# Patient Record
Sex: Male | Born: 1945
Health system: Southern US, Community
[De-identification: ages and names within clinical notes are randomized; demographics above are authoritative.]

## PROBLEM LIST (undated history)

## (undated) DIAGNOSIS — T50905A Adverse effect of unspecified drugs, medicaments and biological substances, initial encounter: Secondary | ICD-10-CM

## (undated) DIAGNOSIS — E785 Hyperlipidemia, unspecified: Secondary | ICD-10-CM

## (undated) DIAGNOSIS — L97509 Non-pressure chronic ulcer of other part of unspecified foot with unspecified severity: Secondary | ICD-10-CM

## (undated) DIAGNOSIS — B351 Tinea unguium: Secondary | ICD-10-CM

## (undated) DIAGNOSIS — N529 Male erectile dysfunction, unspecified: Secondary | ICD-10-CM

## (undated) DIAGNOSIS — C9000 Multiple myeloma not having achieved remission: Secondary | ICD-10-CM

## (undated) DIAGNOSIS — I4891 Unspecified atrial fibrillation: Secondary | ICD-10-CM

## (undated) DIAGNOSIS — I10 Essential (primary) hypertension: Secondary | ICD-10-CM

## (undated) DIAGNOSIS — G2581 Restless legs syndrome: Secondary | ICD-10-CM

## (undated) DIAGNOSIS — C61 Malignant neoplasm of prostate: Secondary | ICD-10-CM

## (undated) DIAGNOSIS — E1140 Type 2 diabetes mellitus with diabetic neuropathy, unspecified: Secondary | ICD-10-CM

## (undated) DIAGNOSIS — G629 Polyneuropathy, unspecified: Secondary | ICD-10-CM

## (undated) DIAGNOSIS — C439 Malignant melanoma of skin, unspecified: Secondary | ICD-10-CM

## (undated) DIAGNOSIS — E1169 Type 2 diabetes mellitus with other specified complication: Secondary | ICD-10-CM

## (undated) DIAGNOSIS — K2100 Gastro-esophageal reflux disease with esophagitis, without bleeding: Secondary | ICD-10-CM

## (undated) HISTORY — DX: Non-pressure chronic ulcer of other part of unspecified foot with unspecified severity: L97.509

## (undated) HISTORY — DX: Polyneuropathy, unspecified: G62.9

## (undated) HISTORY — DX: Gastro-esophageal reflux disease with esophagitis, without bleeding: K21.00

## (undated) HISTORY — DX: Tinea unguium: B35.1

## (undated) HISTORY — DX: Malignant melanoma of skin, unspecified: C43.9

## (undated) HISTORY — PX: OTHER SURGICAL HISTORY: SHX169

## (undated) HISTORY — DX: Adverse effect of unspecified drugs, medicaments and biological substances, initial encounter: T50.905A

## (undated) HISTORY — PX: CATARACT EXTRACTION: SUR2

## (undated) HISTORY — DX: Type 2 diabetes mellitus with other specified complication: E11.69

## (undated) HISTORY — DX: Hyperlipidemia, unspecified: E78.5

## (undated) HISTORY — DX: Restless legs syndrome: G25.81

## (undated) HISTORY — DX: Type 2 diabetes mellitus with diabetic neuropathy, unspecified: E11.40

## (undated) HISTORY — PX: PROSTATE SURGERY: SHX751

## (undated) HISTORY — DX: Male erectile dysfunction, unspecified: N52.9

## (undated) HISTORY — DX: Malignant neoplasm of prostate: C61

## (undated) HISTORY — DX: Unspecified atrial fibrillation: I48.91

## (undated) HISTORY — PX: HERNIA REPAIR: SHX51

## (undated) HISTORY — DX: Multiple myeloma not having achieved remission: C90.00

## (undated) HISTORY — DX: Essential (primary) hypertension: I10

---

## 2003-03-04 ENCOUNTER — Ambulatory Visit (HOSPITAL_COMMUNITY): Admission: RE | Admit: 2003-03-04 | Discharge: 2003-03-04 | Payer: Self-pay | Admitting: Pulmonary Disease

## 2004-06-28 ENCOUNTER — Inpatient Hospital Stay (HOSPITAL_COMMUNITY): Admission: RE | Admit: 2004-06-28 | Discharge: 2004-07-02 | Payer: Self-pay | Admitting: Urology

## 2007-04-11 ENCOUNTER — Ambulatory Visit (HOSPITAL_COMMUNITY): Admission: RE | Admit: 2007-04-11 | Discharge: 2007-04-11 | Payer: Self-pay | Admitting: General Surgery

## 2007-06-18 ENCOUNTER — Ambulatory Visit (HOSPITAL_COMMUNITY): Admission: RE | Admit: 2007-06-18 | Discharge: 2007-06-18 | Payer: Self-pay | Admitting: General Surgery

## 2007-06-18 ENCOUNTER — Encounter (INDEPENDENT_AMBULATORY_CARE_PROVIDER_SITE_OTHER): Payer: Self-pay | Admitting: General Surgery

## 2010-08-15 NOTE — Op Note (Signed)
NAME:  HUDSEN, FEI NO.:  [id number]   MEDICAL RECORD NO.:  [id number]          PATIENT TYPE:  AMB   LOCATION:  DAY                           FACILITY:  [facility]   PHYSICIAN:  Dalia Heading, M.D.  DATE OF BIRTH:  1945/08/07   DATE OF PROCEDURE:  06/18/2007  DATE OF DISCHARGE:                               OPERATIVE REPORT   PREOPERATIVE DIAGNOSIS:  Sebaceous cyst, scalp and chest wall and the  skin tag, left canthus of eye.   POSTOPERATIVE DIAGNOSIS:  Sebaceous cyst, scalp and chest wall and the  skin tag, left canthus of eye.   PROCEDURE:  Excision of sebaceous cysts, scalp and chest wall and shave  removal of skin tag, left lateral canthus of eye.   SURGEON:  Dr. Franky Macho.   ANESTHESIA:  General endotracheal.   INDICATIONS:  The patient is a 65 year old white male who is referred  for evaluation and treatment of sebaceous cyst on the scalp and chest  wall as well as a skin tag along the left lateral aspect of the eye.  The risks and benefits of the procedures including bleeding, infection,  recurrence of the sebaceous cysts were fully explained to the patient,  who gave informed consent.   PROCEDURE NOTE:  The patient was placed in supine position.  After  induction of general endotracheal anesthesia, the left posterior  auricular scalp, lateral left eye, and chest wall were all prepped and  draped in the usual sterile technique with Betadine.  Surgical site  confirmation was performed.   Shave removal of the skin tag on the left eye canthus was performed  using Bovie electrocautery without difficulty.   Next, a 1.5 cm sebaceous cyst was excised from the left posterior  auricular region of the scalp without difficulty.  Any bleeding was  controlled using Bovie electrocautery.  Specimen sent to pathology for  further examination.  The 0.5% Sensorcaine was instilled into the  surrounding wound.  The skin was closed using 4-0 nylon interrupted  sutures.  Betadine ointment and dry sterile dressing were applied.   Next, a 3-cm sebaceous cyst along the left lower midline of the sternum  was excised.  The specimen sent to pathology for further examination.  Any bleeding was controlled using Bovie electrocautery.  0.5%  Sensorcaine was instilled into the surrounding wound.  The skin was  closed using 3-0 nylon interrupted sutures.  Betadine ointment and dry  sterile dressings were applied.   All tape and needle counts were correct at the end of all procedures.  The patient was extubated in the operating room and went back to  recovery room awake and in stable condition.   COMPLICATIONS:  None.   SPECIMEN:  1. Sebaceous cyst, scalp.  2. Sebaceous cyst, chest wall.   BLOOD LOSS:  Minimal.      Dalia Heading, M.D.  Electronically Signed     MAJ/MEDQ  D:  06/18/2007  T:  06/18/2007  Job:  [id number]   cc:   Ramon Dredge L. Juanetta Gosling, M.D.  Fax: [phone]

## 2010-08-15 NOTE — H&P (Signed)
NAME:  [name] [name], [name] [name] NO.:  [id number]   MEDICAL RECORD NO.:  [id number]          PATIENT TYPE:  AMB   LOCATION:  DAY                           FACILITY:  [facility]   PHYSICIAN:  Dalia Heading, M.D.  DATE OF BIRTH:  04/02/1946   DATE OF ADMISSION:  DATE OF DISCHARGE:  LH                              HISTORY & PHYSICAL   CHIEF COMPLAINT:  Sebaceous cyst, scalp and chest wall, and skin tag,  right lateral canthus of eye.   HISTORY OF PRESENT ILLNESS:  Patient is a 65 year old white male who was  referred for evaluation and treatment of two nodules.  One is on his  scalp, and the other is on the chest wall.  Both have been present for  some time but are recently increasing in size and causing him  discomfort.  He also has a skin tag lateral to the right eye.   PAST MEDICAL HISTORY:  Hypertension.   PAST SURGICAL HISTORY:  Prostate surgery.   CURRENT MEDICATIONS:  Atenolol/hydrochlorothiazide and Neurontin.   ALLERGIES:  PENICILLIN.   REVIEW OF SYSTEMS:  Patient denies drinking or smoking.  He denies any  recent chest pain, MI, CVA, or diabetes mellitus.   PHYSICAL EXAMINATION:  Patient is a well-developed and well-nourished  white male in no acute distress.  HEENT:  A 1.5 cm hard nodule behind the left ear on the scalp.  There is  also a small skin tag lateral to the right eye.  LUNGS:  Clear to auscultation with equal breath sounds bilaterally.  HEART:  Regular rate and rhythm without S3, S4, or murmurs.  CHEST:  A lower midline area over the sternum with a 3 cm ovoid, cystic  lesion present.  It is subcutaneous in nature.   IMPRESSION:  1. Sebaceous cyst, scalp and chest wall.  2. Skin tag, right lateral canthus of eye.   PLAN:  Patient is scheduled for excision of the cyst, scalp, and chest  wall as well as the removal of the skin tag, right lateral canthus on  April 11, 2007.  The risks and benefits of the procedure, including  bleeding, infection,  and recurrence, were fully explained to the  patient, who gave informed consent.      Dalia Heading, M.D.     MAJ/MEDQ  D:  04/08/2007  T:  04/08/2007  Job:  [id number]   cc:   [facility]  Fax: [phone]   Oneal Deputy. Juanetta Gosling, M.D.  Fax: [phone]

## 2010-08-15 NOTE — H&P (Signed)
NAME:  [name] [name], DYKE NO.:  [id number]   MEDICAL RECORD NO.:  [id number]          PATIENT TYPE:  AMB   LOCATION:  DAY                           FACILITY:  [facility]   PHYSICIAN:  Dalia Heading, M.D.  DATE OF BIRTH:  1945/12/18   DATE OF ADMISSION:  DATE OF DISCHARGE:  LH                              HISTORY & PHYSICAL   CHIEF COMPLAINT:  Sebaceous cyst, scalp and chest wall, and skin tag,  right lateral canthus of eye.   HISTORY OF PRESENT ILLNESS:  Patient is a 65 year old white male, who  was referred for evaluation and treatment of multiple sebaceous cysts.  He has one on his scalp and the other one on the chest wall.  Both have  been present for some time, but recently are increasing in size and  causing him discomfort.  He is also having a skin tag removed, lateral  to his right eye.   PAST MEDICAL HISTORY:  Includes hypertension and hyperglycemia.   PAST SURGICAL HISTORY:  Prostate surgery.   CURRENT MEDICATIONS:  Atenolol/hydrochlorothiazide, a sugar pill,  Neurontin.   ALLERGIES:  PENICILLIN.   REVIEW OF SYSTEMS:  Patient denies drinking or smoking.  Denies any  recent chest pain, MI, CVA or bleeding disorders.   PHYSICAL EXAMINATION:  Patient is a well-developed, well-nourished white  male, in no acute distress.  HEENT EXAMINATION:  Reveals a 1.5 cm hard nodule behind the left ear on  the scalp.  There is also a small skin tag, lateral to the right eye.  LUNGS:  Clear to auscultation with equal breath sounds bilaterally.  HEART EXAMINATION:  Reveals a regular rate and rhythm without S3, S4, or  murmurs.  CHEST EXAMINATION:  Reveals a 3 cm ovoid cystic lesion present along the  lower midline area of the sternum.  It is subcutaneous in nature.   IMPRESSION:  1. Sebaceous cysts, scalp and chest wall.  2. Skin tag, right lateral canthus of eye.   PLAN:  The patient is scheduled for excision of the cysts on the scalp  and chest wall, as well as  removal of the skin tag, right lateral  canthus, on June 18, 2007.  The risks and benefits of the procedures,  including bleeding, infection, recurrence, were fully explained to the  patient, who gave informed consent.      Dalia Heading, M.D.  Electronically Signed     MAJ/MEDQ  D:  06/13/2007  T:  06/13/2007  Job:  [id number]   cc:   Ramon Dredge L. Juanetta Gosling, M.D.  Fax: [phone]

## 2010-08-15 NOTE — H&P (Signed)
NAME:  [name] [name], [name] [name] NO.:  [id number]   MEDICAL RECORD NO.:  [id number]          PATIENT TYPE:  AMB   LOCATION:  DAY                           FACILITY:  [facility]   PHYSICIAN:  Dalia Heading, M.D.  DATE OF BIRTH:  12-Feb-1946   DATE OF ADMISSION:  DATE OF DISCHARGE:  LH                              HISTORY & PHYSICAL   CHIEF COMPLAINT:  Sebaceous cyst, scalp and chest wall, and skin tag,  right lateral canthus of eye.   HISTORY OF PRESENT ILLNESS:  Patient is a 65 year old white male who was  referred for evaluation and treatment of two nodules.  One is on his  scalp, and the other is on the chest wall.  Both have been present for  some time but are recently increasing in size and causing him  discomfort.  He also has a skin tag lateral to the right eye.   PAST MEDICAL HISTORY:  Hypertension.   PAST SURGICAL HISTORY:  Prostate surgery.   CURRENT MEDICATIONS:  Atenolol/hydrochlorothiazide and Neurontin.   ALLERGIES:  PENICILLIN.   REVIEW OF SYSTEMS:  Patient denies drinking or smoking.  He denies any  recent chest pain, MI, CVA, or diabetes mellitus.   PHYSICAL EXAMINATION:  Patient is a well-developed and well-nourished  white male in no acute distress.  HEENT:  A 1.5 cm hard nodule behind the left ear on the scalp.  There is  also a small skin tag lateral to the right eye.  LUNGS:  Clear to auscultation with equal breath sounds bilaterally.  HEART:  Regular rate and rhythm without S3, S4, or murmurs.  CHEST:  A lower midline area over the sternum with a 3 cm ovoid, cystic  lesion present.  It is subcutaneous in nature.   IMPRESSION:  1. Sebaceous cyst, scalp and chest wall.  2. Skin tag, right lateral canthus of eye.   PLAN:  Patient is scheduled for excision of the cyst, scalp, and chest  wall as well as the removal of the skin tag, right lateral canthus on  April 11, 2007.  The risks and benefits of the procedure, including  bleeding, infection,  and recurrence, were fully explained to the  patient, who gave informed consent.      Dalia Heading, M.D.  Electronically Signed     MAJ/MEDQ  D:  04/08/2007  T:  04/08/2007  Job:  [id number]   cc:   [facility]  Fax: [phone]   Oneal Deputy. Juanetta Gosling, M.D.  Fax: [phone]

## 2010-08-18 NOTE — Group Therapy Note (Signed)
NAME:  NABEEL, GLADSON NO.:  [id number]   MEDICAL RECORD NO.:  [id number]          PATIENT TYPE:  INP   LOCATION:  IC07                          FACILITY:  [facility]   PHYSICIAN:  Edward L. Juanetta Gosling, M.D.DATE OF BIRTH:  Aug 30, 1945   DATE OF PROCEDURE:  DATE OF DISCHARGE:                                   PROGRESS NOTE   PROBLEMS:  1.  Status post radical prostatectomy.  2.  Hypertension.   SUBJECTIVE:  Mr. Favor says he is feeling okay except he has some  problems with his eyes being dry and irritated.  He is otherwise doing well.   PHYSICAL EXAMINATION:  ABDOMEN:  Shows he has very scant sluggish bowel  sounds.  CHEST:  Very clear.  VITAL SIGNS:  Blood pressure 150/90, pulse is 80.   LABORATORY DATA:  Sodium 133, potassium 3.1, glucose 141.  A CBC shows a  white count of 12,600, hemoglobin is 12, platelets 231.   ASSESSMENT:  He seems to be doing fairly well.  I have ordered him some  Lacri-Lube eye drops.  I have ordered him some __________ and will plan to  follow from there.      ELH/MEDQ  D:  06/30/2004  T:  06/30/2004  Job:  [id number]

## 2010-08-18 NOTE — H&P (Signed)
NAME:  [name] [name], [name] [name] NO.:  [id number]   MEDICAL RECORD NO.:  [id number]          PATIENT TYPE:  AMB   LOCATION:  DAY                           FACILITY:  [facility]   PHYSICIAN:  Ky Barban, M.D.DATE OF BIRTH:  01-18-1946   DATE OF ADMISSION:  06/28/2004  DATE OF DISCHARGE:  LH                                HISTORY & PHYSICAL   CHIEF COMPLAINT:  Prostate cancer.   HISTORY:  A 65 year old gentleman referred to me by Dr. Juanetta Gosling on February  20 because his PSA was 4.78.  He has basically no other urological  complaint.  His age with symptoms score is 16.  I repeated his PSA.  In a  couple of weeks, it has gone up to 5.8, and his free PSA has dropped to 7.1.  I recommended that he undergo prostate biopsy which was done along with  __________ and multiple biopsies were done.  They came back positive.  Several biopsies were positive and shows Gleason grade of 4+4.  Because of  the age of 62, I recommended the following treatment options:  Radical  retropubic prostatectomy and radiotherapy.  I recommended that he undergo  radical retropubic prostatectomy, but there is a good chance the margins are  going to be positive because it is high-grade cancer, but we can give  radiation also or separately if need to be. or he can just go ahead and have  radiotherapy to start with.   I have a couple of meetings, long discussion with patient and his wife.  They came back and told me that he wanted to go ahead and have radical  retropubic prostatectomy.  I have discussed in detail its complications:  1)  Urinary incontinence which really goes away after a few months but  possibility that it becomes permanent is there, although small.  2) Erectile  dysfunction.  He will develop that is a very good chance.  3) Blood  transfusion.  These are usual complications with this operation, but other  possible complications such as pulmonary embolism, etc., are also possible   PAST  MEDICAL HISTORY:  He does have hypertension, taking medications.  No  history of diabetes.   MEDICATIONS:  He is taking Atenolol, Flomax, Prilosec.   FAMILY HISTORY:  No history of prostate cancer.   PERSONAL HISTORY:  Does not smoke or drink.   REVIEW OF SYSTEMS:  Denies any chest pain, orthopnea, PND, nausea, vomiting.   PHYSICAL EXAMINATION:  VITAL SIGNS:  Blood pressure 148/72, temperature  normal.  CENTRAL NERVOUS SYSTEM:  No gross neurological deficit.  HEAD/NECK/ENT:  Negative.  No thyromegaly or lymphadenopathy.  No carotid  bruits.  CHEST:  Clear.  HEART:  Regular sinus rhythm.  ABDOMEN:  Soft and flat.  Liver, spleen, kidneys not palpable.  EXTERNAL GENITALIA:  He is uncircumcised.  Meatus is adequate.  Testicles  are normal.  RECTAL:  Prostate is 1.5+, smooth, and firm.   IMPRESSION:  1.  Prostate cancer.  2.  Hypertension.   PLAN:  Radical retropubic prostatectomy, bilateral pelvic node dissection.  The patient is coming as outpatient  tomorrow and will be admitted after the  operation.      MIJ/MEDQ  D:  06/27/2004  T:  06/27/2004  Job:  [id number]   cc:   Ramon Dredge L. Juanetta Gosling, M.D.  [address]  [place]  [place] [address]  Fax: [phone]

## 2010-08-18 NOTE — Op Note (Signed)
NAME:  PJ, ZEHNER NO.:  [id number]   MEDICAL RECORD NO.:  [id number]          PATIENT TYPE:  AMB   LOCATION:  DAY                           FACILITY:  [facility]   PHYSICIAN:  Ky Barban, M.D.DATE OF BIRTH:  03/09/1946   DATE OF PROCEDURE:  06/28/2004  DATE OF DISCHARGE:                                 OPERATIVE REPORT   PREOPERATIVE DIAGNOSIS:  Carcinoma of prostate.   POSTOPERATIVE DIAGNOSIS:  Carcinoma of prostate.   PROCEDURES:  1.  Bilateral pelvic node dissections.  2.  Frozen section.  3.  Radical retropubic prostatectomy.   ANESTHESIA:  General endotracheal.   SURGEON:  Ky Barban, M.D.   ASSISTANT:  ___________, R.N.   ESTIMATED BLOOD LOSS:  1300 mL.  No replacement.   Instrument, needle and sponge counts correct.   COMPLICATIONS:  None.   DESCRIPTION OF PROCEDURE:  The patient was given general endotracheal  anesthesia.  In low lithotomy position after usual prep and drape, a midline  suprapubic incision was made.  Rectus sheath was incised.  Recti separated  in the midline.  The retropubic space was entered.  The bladder was  separated from the sidewalls.  The right and left external iliac veins and  obturator nerve and vessels were exposed.  A self-retaining Bookwalter  retractor was applied.  Proceeded to do node resection on the right side  triangle between the obturator nerve and vessels.  The external iliac vein  contains a lot of fatty tissue covering the psoas fascia.  Started from the  lateral margin.  Lymphatics were clipped and divided.  Specimens were lifted  off of the psoas fascia and clipping and dividing the lymphatics this  specimen was removed completely and sent for frozen section.  The same  specimen from the left side was removed.  Frozen section reports from both  side came back negative for any metastatic disease.  I proceeded to do the  radical prostatectomy.  The area of the lymph nodes was left  packed.  The  rectus pubic space was inspected.  The anterior surface of the prostate  capsule was cleaned of the fatty tissue.  The endopelvic fascia was divided.  A small incision was made near the apex of the prostate on both sides.  This  incision was carried superiorly along the lateral side of the prostate,  dividing the fascia and pushing the neurovascular bundles away from the  prostate.  Using 0 Vicryl stitch, it was placed in the dorsal vein complex  distal to the apex of the prostate.  A couple of stitches were placed and  one stitch was placed at the apex of the prostate in the prostate.  In  between these stitches, the dorsal vein complex was divided.  I also passed  a McDougal clamp through the endopelvic fascia opening near the apex of the  prostate and this is where I ligated the dorsal vein complex with 0 Vicryl  tie in addition to the blind stitches.  After dividing the dorsal vein  complex, the prostate moved superiorly.  The puboprostatic ligaments were  also  divided.  The apex of the prostate and the urethra were exposed.  The  patient has a Foley catheter.  I passed the long tip right angle behind the  urethra and lifted it up and divided the anterior wall of the urethra.  A  running stitch through the anterior wall of the urethra and the dorsal vein  complex using 3-0 Vicryl was placed.  The Foley catheter was grabbed with a  yellow ___________ clamp and pulled out.  The distal end was divided.  Then  the posterior wall of the urethra was divided.  With slight traction on the  apex of the prostate on the Foley catheter, the urethral rectalis muscle was  divided.  That freed the prostate and slight traction on the prostate lifted  it up.  Inferior pedicles of the prostate were clipped and divided.  The  rectum was pushed away from the prostate.  Denonvilliers fascia covering the  seminal vesicle was opened up.  Both seminal vesicles were dissected.  I was  able to get  the left one out, clipping the seminal vesicle artery.  ____________ were identified, clipped and divided.  The right seminal  vesicle was partially dissected out from this side.  Some part of the sheath  covering the seminal vesicle near the prostate was divided on each side.  Now I am ready to open the bladder neck.  Anteriorly it was opened up.  Foley catheter was removed and a #14 red rubber catheter was inserted.  The  ureteral orifice was identified.  A #5 whistle-tip catheter was passed up  into the ureters and stabilized to the trigone with 4-0 chromic stitch.  The  remainder part of the bladder neck under vision was divided with the help of  the cautery.  Plane was developed between the bladder and the prostate.  The  bladder neck was completely divided.  The superior pedicles of the prostate  were ligated and divided.  The remaining part of the seminal vesicle sheath  was divided from this side.  With blunt and sharp dissection, the left  seminal vesicle came out after clipping the seminal vesicle artery.  The  specimen was removed.  The obturator site was thoroughly irrigated and left  packed.  Bladder neck closed from each side with 0 Vicryl stitch, leaving  the opening the size of my index finger.  Now a running stitch on the  bladder neck everting the bladder mucosa covering the bladder neck using 4-0  chromic was applied.  Now I am ready to do the anastomosis.  A #30 Foley  catheter with 30 mL balloon was inserted through the urethra and the pelvic  end of the Foley catheter passed free tie through the Foley catheter.  One  assistant was pushing the perineum so that the urethral stump was pushed  superiorly.  Six stitches of 3-0 Vicryl were placed between the urethral  stump and the bladder neck.  After this the Foley balloon was checked and  the Foley catheter placed in the bladder.  The balloon was inflated to 30 mL.  The superior blade of the retractor was removed.  The  bladder was  pushed into the retropubic space so that urethral stump and the bladder neck  was approximated with traction on the Foley catheter.  All six stitches were  closed in the sequence they were placed.  The anterior stitches were tied to  the dorsal vein complex stitches along with the bladder neck stitch  on each  side.  The bladder was filled up.  No leak from the anastomosis.  The  ureteral catheter was already coming out through a separate stab wound in  the bladder.  All of the laps have been removed.  The sponge count and  instrument count are correct.  The needle count is correct.  So I proceeded  to close the incision.  I had placed a Shiley sump in the retropubic space  though a separate stab wound along with the ureteral catheter coming out  through the same opening.  They were stabilized to the skin with 0 silk  stitch along with the Shiley sump.  The rectus sheath was closed with a  continuous stitch of 0 Vicryl.  The  subcutaneous tissues were approximated with three to four stitches of 3-0  Vicryl stitch.  The skin was closed with clips.  The patient was stable.  He  lost about 1300 mL of blood.  No replacement.  Sterile gauze dressing  applied.  Left the operating room in satisfactory condition.      MIJ/MEDQ  D:  06/28/2004  T:  06/28/2004  Job:  [id number]   cc:   Ramon Dredge L. Juanetta Gosling, M.D.  [address]  [place]  [place] [address]  Fax: [phone]

## 2010-08-18 NOTE — Discharge Summary (Signed)
NAME:  [name] [name], [name] [name] NO.:  [id number]   MEDICAL RECORD NO.:  [id number]          PATIENT TYPE:  INP   LOCATION:  A214                          FACILITY:  [facility]   PHYSICIAN:  Ky Barban, M.D.DATE OF BIRTH:  1945/05/17   DATE OF ADMISSION:  06/28/2004  DATE OF DISCHARGE:  04/02/2006LH                                 DISCHARGE SUMMARY   A 65 year old had a prostate biopsy because his PSA was elevated.  His  several biopsies came back positive with Gleason grade of 4+4.  Because of  his age of 35, I recommended that he undergo radical retropubic  prostatectomy.  Other treatment options, including radiation, were  discussed.  The patient and his wife elected to undergo radical retropubic  prostatectomy, and he was briefed about the complications, especially  incontinence, erectile dysfunction and bleeding requiring blood transfusion.  He agreed to undergo radical prostatectomy.  His other problems include  hypertension, which is well under control.   HOSPITAL COURSE AND ROUTINE:  Admission workup is done.  CBC, urinalysis,  ASTRA-7 and EKG are all normal.  He was taken to the operating room on the  29th of March and underwent bilateral pelvic node dissection with radical  retropubic prostatectomy.  Post-op course is benign.  He was closely  followed by his family physician, Dr. Juanetta Gosling.  First post-op day, his blood  pressure 138/56, pulse 76 per minute, temperature 98.6, O2 saturation 95%.  Sodium 134, potassium 3, chloride 101, CO2 25, BUN 8, creatinine 1.1.  WBC  count 14.1, hematocrit 34.8.  I have added 20 mEq KCl __________ IV fluid.  Started him out of bed.  CBC and MET-7 in the morning.  The next day, his  potassium came up to 3.3.  Hematocrit 33.9.  He continued to do well.  On  March 31st, discontinued his sump and urethral catheter and started him on  clear liquids.  On April 1st, he was doing fine, up and about, and his wound  is healing up  primarily.  Had bowel movement.  His final pathology report is  back.  His TNM Code is pT2c/N0/Mx, and all the margins are negative.  I want  to mention that his Gleason score is much different than on the biopsy.  His  Gleason score is 2+1=3.  It involves both lobes.  As I mentioned, he is  being discharged with a Foley catheter.  I will see him back next week for  removal of the stitches.  For his discharge medications, he is given  Percocet 1 q.6-8h. p.r.n., #30.  I will see him back Thursday for removal of  the stitches.   FINAL DISCHARGE DIAGNOSES:  1.  Carcinoma, prostate.  2.  Hypertension.      MIJ/MEDQ  D:  07/25/2004  T:  07/25/2004  Job:  [id number]   cc:   Ramon Dredge L. Juanetta Gosling, M.D.  [address]  [place]  [place] [address]  Fax: [phone]

## 2010-08-18 NOTE — Group Therapy Note (Signed)
NAME:  [name] [name], [name] [name] NO.:  [id number]   MEDICAL RECORD NO.:  [id number]          PATIENT TYPE:  INP   LOCATION:  A214                          FACILITY:  [facility]   PHYSICIAN:  Edward L. Juanetta Gosling, M.D.DATE OF BIRTH:  [birth year]   DATE OF PROCEDURE:  07/01/2004  DATE OF DISCHARGE:                                   PROGRESS NOTE   PROBLEM:  Status post prostatectomy.   SUBJECTIVE:  [name] [name] says he is feeling pretty well.  He is having some  gas pains and wants to know if he can have a suppository.  I told him that  since his surgery was so close to his rectum that I would like to get an  opinion from Dr. Jerre Simon before I order anything.  Otherwise, he has been up  moving around which is excellent, and he has no other new complaints.  He  does have a history of hypertension.  His blood pressure is still pretty  well controlled, and I am not going to put him on anything until he is  taking stuff p.o. unless his pressure gets much higher.   PHYSICAL EXAMINATION:  GENERAL:  This morning, his examination shows that he  is awake and alert.  CHEST:  Very clear.  ABDOMEN:  He does have some bowel sounds.  VITAL SIGNS:  Temperature is 98.8, pulse 76, respirations 16, blood pressure  152/69, O2 saturation is 97% on 2 L.   ASSESSMENT:  He is better.   PLAN:  Continue with his treatments, continue with his medications, and  follow.      ELH/MEDQ  D:  07/01/2004  T:  07/01/2004  Job:  [id number]

## 2010-08-18 NOTE — Consult Note (Signed)
NAME:  [name], [name] NO.:  [id number]   MEDICAL RECORD NO.:  [id number]          PATIENT TYPE:  INP   LOCATION:  IC07                          FACILITY:  [facility]   PHYSICIAN:  Edward L. Juanetta Gosling, M.D.DATE OF BIRTH:  [birth year]   DATE OF CONSULTATION:  06/28/2004  DATE OF DISCHARGE:                                   CONSULTATION   REASON FOR CONSULTATION:  Medical management postoperatively.   HISTORY OF PRESENT ILLNESS:  [name] [name] is a patient who underwent a  surgical resection of the prostate today, having had prostate cancer.  I had  done a physical examination, check a PSA and it was 4.78.  It was repeated  then went up.  He had a biopsy done which was positive for cancer with a  Gleason 4+4.  He underwent prostatectomy today.   PAST MEDICAL HISTORY:  Hypertension.   MEDICATIONS:  1.  Atenolol 50 mg daily.  2.  Flomax 0.4 mg daily.  3.  Prilosec 20 mg daily.   FAMILY HISTORY:  Positive for breast cancer.  Both of his parents died of  cancer, and he had a son who died of cancer.   SOCIAL HISTORY:  He is a nonsmoker.  He does not drink any alcohol.   REVIEW OF SYSTEMS:  Except as mentioned is negative.   PHYSICAL EXAMINATION:  GENERAL:  He is postoperative.  Has catheter in  place.  VITAL SIGNS:  Pulse is 61, blood pressure 123/53, O2 saturation 92%,  respirations are about 10.  HEENT:  Mucous membranes are slightly dry.  CHEST:  Clear.  HEART:  Regular.  ABDOMEN:  Soft.  EXTREMITIES:  No edema.  NEUROLOGIC:  Grossly intact.   ASSESSMENT:  1.  He has hypertension.  2.  He has prostate cancer resected.  Postoperatively, his hemoglobin is 13,      hematocrit 36, potassium was 3.   PLAN:  Follow with you.  At this point, will not restart his blood pressure  medications.  Not start any anticoagulation, etc., as yet.  He is going to  have a Met-7 in the morning.  I will not replace his potassium until we see  what that shows.  Thanks for allowing  me to see him with you.      ELH/MEDQ  D:  06/28/2004  T:  06/28/2004  Job:  [id number]

## 2010-11-28 ENCOUNTER — Ambulatory Visit (HOSPITAL_COMMUNITY)
Admission: RE | Admit: 2010-11-28 | Discharge: 2010-11-28 | Disposition: A | Discharge status: Home / Self Care | Payer: Managed Care, Other (non HMO) | Source: Ambulatory Visit | Attending: Pulmonary Disease | Admitting: Pulmonary Disease

## 2010-11-28 ENCOUNTER — Other Ambulatory Visit (HOSPITAL_COMMUNITY): Payer: Self-pay | Admitting: Pulmonary Disease

## 2010-11-28 DIAGNOSIS — M79673 Pain in unspecified foot: Secondary | ICD-10-CM

## 2010-11-28 DIAGNOSIS — M2010 Hallux valgus (acquired), unspecified foot: Secondary | ICD-10-CM | POA: Insufficient documentation

## 2010-11-28 DIAGNOSIS — M949 Disorder of cartilage, unspecified: Secondary | ICD-10-CM | POA: Insufficient documentation

## 2010-11-28 DIAGNOSIS — M25579 Pain in unspecified ankle and joints of unspecified foot: Secondary | ICD-10-CM | POA: Insufficient documentation

## 2010-11-28 DIAGNOSIS — E119 Type 2 diabetes mellitus without complications: Secondary | ICD-10-CM | POA: Insufficient documentation

## 2010-11-28 DIAGNOSIS — M899 Disorder of bone, unspecified: Secondary | ICD-10-CM | POA: Insufficient documentation

## 2010-12-20 LAB — BASIC METABOLIC PANEL
BUN: 14
CO2: 27
Calcium: 9.2
Chloride: 91 — ABNORMAL LOW
Creatinine, Ser: 0.94
GFR calc Af Amer: >60
GFR calc non Af Amer: >60
Glucose, Bld: 469 — ABNORMAL HIGH
Potassium: 3.3 — ABNORMAL LOW
Sodium: 131 — ABNORMAL LOW

## 2010-12-20 LAB — POCT I-STAT 4, (NA,K, GLUC, HGB,HCT)
Glucose, Bld: 322 — ABNORMAL HIGH
HCT: 48
Hemoglobin: 16.3
Operator id: 282951
Potassium: 3.2 — ABNORMAL LOW
Sodium: 134 — ABNORMAL LOW

## 2010-12-20 LAB — CBC
HCT: 43.5
Hemoglobin: 15.2
MCHC: 35
MCV: 87.7
Platelets: 259
RBC: 4.96
RDW: 13.3
WBC: 9.9

## 2010-12-25 LAB — CBC
HCT: 44.3
Hemoglobin: 15.7
MCHC: 35.5
MCV: 88.4
Platelets: 305
RBC: 5.01
RDW: 13.1
WBC: 10

## 2010-12-25 LAB — BASIC METABOLIC PANEL
BUN: 13
CO2: 28
Calcium: 9.2
Chloride: 100
Creatinine, Ser: 0.91
GFR calc Af Amer: >60
GFR calc non Af Amer: >60
Glucose, Bld: 118 — ABNORMAL HIGH
Potassium: 3.6
Sodium: 135

## 2011-09-05 LAB — VITAMIN D 25 HYDROXY (VIT D DEFICIENCY, FRACTURES): Vit D, 25-Hydroxy: 40

## 2014-04-27 DIAGNOSIS — L97511 Non-pressure chronic ulcer of other part of right foot limited to breakdown of skin: Secondary | ICD-10-CM | POA: Diagnosis not present

## 2014-06-08 DIAGNOSIS — B351 Tinea unguium: Secondary | ICD-10-CM | POA: Diagnosis not present

## 2014-06-08 DIAGNOSIS — L84 Corns and callosities: Secondary | ICD-10-CM | POA: Diagnosis not present

## 2014-06-08 DIAGNOSIS — E1142 Type 2 diabetes mellitus with diabetic polyneuropathy: Secondary | ICD-10-CM | POA: Diagnosis not present

## 2014-06-15 DIAGNOSIS — I10 Essential (primary) hypertension: Secondary | ICD-10-CM | POA: Diagnosis not present

## 2014-06-15 DIAGNOSIS — I4891 Unspecified atrial fibrillation: Secondary | ICD-10-CM | POA: Diagnosis not present

## 2014-06-15 DIAGNOSIS — L97509 Non-pressure chronic ulcer of other part of unspecified foot with unspecified severity: Secondary | ICD-10-CM | POA: Diagnosis not present

## 2014-06-15 DIAGNOSIS — E1140 Type 2 diabetes mellitus with diabetic neuropathy, unspecified: Secondary | ICD-10-CM | POA: Diagnosis not present

## 2014-06-15 DIAGNOSIS — E1169 Type 2 diabetes mellitus with other specified complication: Secondary | ICD-10-CM | POA: Diagnosis not present

## 2014-06-16 ENCOUNTER — Ambulatory Visit (HOSPITAL_COMMUNITY)
Admission: RE | Admit: 2014-06-16 | Discharge: 2014-06-16 | Disposition: A | Discharge status: Home / Self Care | Payer: Medicare Other | Source: Ambulatory Visit | Attending: Pulmonary Disease | Admitting: Pulmonary Disease

## 2014-06-16 DIAGNOSIS — I4891 Unspecified atrial fibrillation: Secondary | ICD-10-CM | POA: Diagnosis not present

## 2014-06-16 NOTE — Progress Notes (Signed)
  Echocardiogram 2D Echocardiogram has been performed.  Samuel Germany 06/16/2014, 1:50 PM

## 2014-06-21 DIAGNOSIS — E1169 Type 2 diabetes mellitus with other specified complication: Secondary | ICD-10-CM | POA: Diagnosis not present

## 2014-06-21 DIAGNOSIS — L97509 Non-pressure chronic ulcer of other part of unspecified foot with unspecified severity: Secondary | ICD-10-CM | POA: Diagnosis not present

## 2014-06-21 DIAGNOSIS — E1140 Type 2 diabetes mellitus with diabetic neuropathy, unspecified: Secondary | ICD-10-CM | POA: Diagnosis not present

## 2014-06-21 DIAGNOSIS — E11319 Type 2 diabetes mellitus with unspecified diabetic retinopathy without macular edema: Secondary | ICD-10-CM | POA: Diagnosis not present

## 2014-06-21 DIAGNOSIS — I10 Essential (primary) hypertension: Secondary | ICD-10-CM | POA: Diagnosis not present

## 2014-06-23 ENCOUNTER — Encounter: Payer: Self-pay | Admitting: Cardiology

## 2014-06-23 ENCOUNTER — Ambulatory Visit (INDEPENDENT_AMBULATORY_CARE_PROVIDER_SITE_OTHER): Payer: Medicare Other | Admitting: Cardiology

## 2014-06-23 VITALS — BP 142/90 | HR 83 | Ht 68.0 in | Wt 201.0 lb

## 2014-06-23 DIAGNOSIS — I4891 Unspecified atrial fibrillation: Secondary | ICD-10-CM | POA: Diagnosis not present

## 2014-06-23 NOTE — Patient Instructions (Signed)

## 2014-06-23 NOTE — Progress Notes (Signed)
Clinical Summary Mr. Kaufhold is a 69 y.o.male seen today as a new patient for the following medical problems.  1. Afib - pcp records reviewed - new diagnosis made by pcp earlier this month - denies any palpitations, but has had some episodes of SOB.  - echo with normal LVEF, does have some left atrial enlargement. Labs are pending - on atenolol and eliquis, tolerating well.      No past medical history on file.   Allergies  Allergen Reactions  . Penicillins Rash    Also swelling reaction      Current Outpatient Prescriptions  Medication Sig Dispense Refill  . apixaban (ELIQUIS) 5 MG TABS tablet Take 5 mg by mouth 2 (two) times daily.    . diclofenac sodium (VOLTAREN) 1 % GEL Apply 2 g topically daily as needed.    Marland Kitchen ketoconazole (NIZORAL) 2 % cream Apply 1 application topically daily as needed for irritation.    . potassium chloride (KLOR-CON) 20 MEQ packet Take 20 mEq by mouth once.     No current facility-administered medications for this visit.     No past surgical history on file.   Allergies  Allergen Reactions  . Penicillins Rash    Also swelling reaction       No family history on file.   Social History Mr. Rubens reports that he has never smoked. He has never used smokeless tobacco. Mr. Kattner reports that he does not drink alcohol.   Review of Systems CONSTITUTIONAL: No weight loss, fever, chills, weakness or fatigue.  HEENT: Eyes: No visual loss, blurred vision, double vision or yellow sclerae.No hearing loss, sneezing, congestion, runny nose or sore throat.  SKIN: No rash or itching.  CARDIOVASCULAR: per HPI RESPIRATORY: No shortness of breath, cough or sputum.  GASTROINTESTINAL: No anorexia, nausea, vomiting or diarrhea. No abdominal pain or blood.  GENITOURINARY: No burning on urination, no polyuria NEUROLOGICAL: No headache, dizziness, syncope, paralysis, ataxia, numbness or tingling in the extremities. No change in bowel or  bladder control.  MUSCULOSKELETAL: No muscle, back pain, joint pain or stiffness.  LYMPHATICS: No enlarged nodes. No history of splenectomy.  PSYCHIATRIC: No history of depression or anxiety.  ENDOCRINOLOGIC: No reports of sweating, cold or heat intolerance. No polyuria or polydipsia.  Marland Kitchen   Physical Examination Filed Vitals:   06/23/14 0821  BP: 142/90  Pulse: 83   Filed Weights   06/23/14 0821  Weight: 201 lb (91.173 kg)    Gen: resting comfortably, no acute distress HEENT: no scleral icterus, pupils equal round and reactive, no palptable cervical adenopathy,  CV: irreg, no m/r/g, no JVD, no carotid bruits Resp: Clear to auscultation bilaterally GI: abdomen is soft, non-tender, non-distended, normal bowel sounds, no hepatosplenomegaly MSK: extremities are warm, no edema.  Skin: warm, no rash Neuro:  no focal deficits Psych: appropriate affect   Diagnostic Studies 06/2014 Study Conclusions  - Left ventricle: The cavity size was normal. There was mild concentric hypertrophy. Systolic function was normal. The estimated ejection fraction was in the range of 55% to 60%. Wall motion was normal; there were no regional wall motion abnormalities. The study was not technically sufficient to allow evaluation of LV diastolic dysfunction due to atrial fibrillation. - Aortic valve: Mildly calcified annulus. Mildly thickened leaflets. There was trivial regurgitation. - Mitral valve: Calcified annulus. Mildly thickened leaflets . There was mild regurgitation. - Left atrium: The atrium was severely dilated. Volume/bsa, ES, (1-plane Simpson&'s, A2C): 40 ml/m^2. - Right ventricle: Systolic  function was low normal. - Right atrium: The atrium was moderately dilated. - Tricuspid valve: There was mild-moderate regurgitation. - Pulmonary arteries: Pulmonary pressures may be underestimated given the degree of right atrial enlargement. There are likely mildly elevated  pulmonary pressures.   Assessment and Plan  1. Afib - new diagnosis. He denies any recurrent symptoms. Doing well on low dose atenolol, started on eliquis for stroke prevention due to CHADS2Vasc score of 3 (age, HTN,DM) - continue current meds, f/u labs      Arnoldo Lenis, M.D.

## 2014-07-27 DIAGNOSIS — I10 Essential (primary) hypertension: Secondary | ICD-10-CM | POA: Diagnosis not present

## 2014-07-27 DIAGNOSIS — I4891 Unspecified atrial fibrillation: Secondary | ICD-10-CM | POA: Diagnosis not present

## 2014-07-27 DIAGNOSIS — E1140 Type 2 diabetes mellitus with diabetic neuropathy, unspecified: Secondary | ICD-10-CM | POA: Diagnosis not present

## 2014-08-10 DIAGNOSIS — E1142 Type 2 diabetes mellitus with diabetic polyneuropathy: Secondary | ICD-10-CM | POA: Diagnosis not present

## 2014-08-10 DIAGNOSIS — L84 Corns and callosities: Secondary | ICD-10-CM | POA: Diagnosis not present

## 2014-08-10 DIAGNOSIS — B351 Tinea unguium: Secondary | ICD-10-CM | POA: Diagnosis not present

## 2014-10-19 DIAGNOSIS — L84 Corns and callosities: Secondary | ICD-10-CM | POA: Diagnosis not present

## 2014-10-19 DIAGNOSIS — E1142 Type 2 diabetes mellitus with diabetic polyneuropathy: Secondary | ICD-10-CM | POA: Diagnosis not present

## 2014-10-19 DIAGNOSIS — B351 Tinea unguium: Secondary | ICD-10-CM | POA: Diagnosis not present

## 2014-11-15 DIAGNOSIS — Z0000 Encounter for general adult medical examination without abnormal findings: Secondary | ICD-10-CM | POA: Diagnosis not present

## 2014-12-07 DIAGNOSIS — I10 Essential (primary) hypertension: Secondary | ICD-10-CM | POA: Diagnosis not present

## 2014-12-07 DIAGNOSIS — E1140 Type 2 diabetes mellitus with diabetic neuropathy, unspecified: Secondary | ICD-10-CM | POA: Diagnosis not present

## 2014-12-07 DIAGNOSIS — Z0000 Encounter for general adult medical examination without abnormal findings: Secondary | ICD-10-CM | POA: Diagnosis not present

## 2014-12-07 DIAGNOSIS — E785 Hyperlipidemia, unspecified: Secondary | ICD-10-CM | POA: Diagnosis not present

## 2014-12-07 DIAGNOSIS — B351 Tinea unguium: Secondary | ICD-10-CM | POA: Diagnosis not present

## 2014-12-07 DIAGNOSIS — G2581 Restless legs syndrome: Secondary | ICD-10-CM | POA: Diagnosis not present

## 2014-12-07 DIAGNOSIS — C61 Malignant neoplasm of prostate: Secondary | ICD-10-CM | POA: Diagnosis not present

## 2014-12-10 DIAGNOSIS — Z23 Encounter for immunization: Secondary | ICD-10-CM | POA: Diagnosis not present

## 2014-12-28 DIAGNOSIS — L84 Corns and callosities: Secondary | ICD-10-CM | POA: Diagnosis not present

## 2014-12-28 DIAGNOSIS — E1142 Type 2 diabetes mellitus with diabetic polyneuropathy: Secondary | ICD-10-CM | POA: Diagnosis not present

## 2014-12-28 DIAGNOSIS — B351 Tinea unguium: Secondary | ICD-10-CM | POA: Diagnosis not present

## 2015-01-11 ENCOUNTER — Telehealth: Payer: Self-pay | Admitting: *Deleted

## 2015-01-11 ENCOUNTER — Ambulatory Visit (INDEPENDENT_AMBULATORY_CARE_PROVIDER_SITE_OTHER): Payer: Medicare Other | Admitting: Cardiology

## 2015-01-11 ENCOUNTER — Encounter: Payer: Self-pay | Admitting: Cardiology

## 2015-01-11 VITALS — BP 148/88 | HR 73 | Ht 68.0 in | Wt 192.0 lb

## 2015-01-11 DIAGNOSIS — E785 Hyperlipidemia, unspecified: Secondary | ICD-10-CM | POA: Diagnosis not present

## 2015-01-11 DIAGNOSIS — I4891 Unspecified atrial fibrillation: Secondary | ICD-10-CM | POA: Diagnosis not present

## 2015-01-11 DIAGNOSIS — I10 Essential (primary) hypertension: Secondary | ICD-10-CM

## 2015-01-11 MED ORDER — APIXABAN 5 MG PO TABS: 5.0000 mg | ORAL_TABLET | Freq: Two times a day (BID) | ORAL | Status: DC

## 2015-01-11 NOTE — Telephone Encounter (Signed)
Prior Authorization completed for Eliquis. Good through 01/11/2016. Case number: [id number]

## 2015-01-11 NOTE — Progress Notes (Signed)
Patient ID: [name] [name], male   DOB: [birth year], 69 y.o.   MRN: [id number]     Clinical Summary [name] [name] is a 69 y.o.male seen today for follow up of the following medical problems.   1. Afib - reports isolated episode of palpitations since last visit. Otherwise no recent troubles - reports he has not been able to get eliquis, he reports issues with pharmacy with the Rx. He has been taking the samples he received but only one tablet daily.   2. DM2 - followed by pcp  3. HTN - compliant with meds - does not check regularly at home   4. Hyperliidemia - muscle aches with statins, currently not on  PMH 1. Afib 2. HTN 3. DM2     Allergies  Allergen Reactions  . Penicillins Rash    Also swelling reaction      Current Outpatient Prescriptions  Medication Sig Dispense Refill  . acetaminophen-codeine (TYLENOL #3) 300-30 MG per tablet Take by mouth 4 (four) times daily as needed for moderate pain.    Marland Kitchen apixaban (ELIQUIS) 5 MG TABS tablet Take 5 mg by mouth 2 (two) times daily.    Marland Kitchen atenolol-chlorthalidone (TENORETIC) 50-25 MG per tablet Take 1 tablet by mouth daily.    . diclofenac sodium (VOLTAREN) 1 % GEL Apply 2 g topically daily as needed.    . gabapentin (NEURONTIN) 300 MG capsule Take 300 mg by mouth every 4 (four) hours.    Marland Kitchen ketoconazole (NIZORAL) 2 % cream Apply 1 application topically daily as needed for irritation.    Marland Kitchen loperamide (IMODIUM A-D) 2 MG tablet Take 2 mg by mouth 4 (four) times daily as needed for diarrhea or loose stools.    . metFORMIN (GLUCOPHAGE) 500 MG tablet Take 500 mg by mouth 2 (two) times daily with a meal.    . pantoprazole (PROTONIX) 40 MG tablet Take 40 mg by mouth daily.    . potassium chloride (KLOR-CON) 20 MEQ packet Take 20 mEq by mouth once.     No current facility-administered medications for this visit.     No past surgical history on file.   Allergies  Allergen Reactions  . Penicillins Rash    Also swelling  reaction       No family history on file.   Social History [name] [name] reports that he has never smoked. He has never used smokeless tobacco. [name] [name] reports that he does not drink alcohol.   Review of Systems CONSTITUTIONAL: No weight loss, fever, chills, weakness or fatigue.  HEENT: Eyes: No visual loss, blurred vision, double vision or yellow sclerae.No hearing loss, sneezing, congestion, runny nose or sore throat.  SKIN: No rash or itching.  CARDIOVASCULAR: per HPI RESPIRATORY: No shortness of breath, cough or sputum.  GASTROINTESTINAL: No anorexia, nausea, vomiting or diarrhea. No abdominal pain or blood.  GENITOURINARY: No burning on urination, no polyuria NEUROLOGICAL: No headache, dizziness, syncope, paralysis, ataxia, numbness or tingling in the extremities. No change in bowel or bladder control.  MUSCULOSKELETAL: No muscle, back pain, joint pain or stiffness.  LYMPHATICS: No enlarged nodes. No history of splenectomy.  PSYCHIATRIC: No history of depression or anxiety.  ENDOCRINOLOGIC: No reports of sweating, cold or heat intolerance. No polyuria or polydipsia.  Marland Kitchen   Physical Examination Filed Vitals:   01/11/15 0807  BP: 148/88  Pulse: 73   Filed Vitals:   01/11/15 0807  Height: 5\' 8"  (1.727 m)  Weight: 192 lb (87.091 kg)    Gen:  resting comfortably HEENT: no scleral icterus, pupils equal round and reactive, no palptable cervical adenopathy,  CV: irreg, no m/r/g, no jvd. No carotid bruits Resp: Clear to auscultation bilaterally GI: abdomen is soft, non-tender, non-distended, normal bowel sounds, no hepatosplenomegaly MSK: extremities are warm, no edema.  Skin: warm, no rash Neuro:  no focal deficits Psych: appropriate affect   Diagnostic Studies 06/2014 Study Conclusions  - Left ventricle: The cavity size was normal. There was mild concentric hypertrophy. Systolic function was normal. The estimated ejection fraction was in the range of 55% to  60%. Wall motion was normal; there were no regional wall motion abnormalities. The study was not technically sufficient to allow evaluation of LV diastolic dysfunction due to atrial fibrillation. - Aortic valve: Mildly calcified annulus. Mildly thickened leaflets. There was trivial regurgitation. - Mitral valve: Calcified annulus. Mildly thickened leaflets . There was mild regurgitation. - Left atrium: The atrium was severely dilated. Volume/bsa, ES, (1-plane Simpson&'s, A2C): 40 ml/m^2. - Right ventricle: Systolic function was low normal. - Right atrium: The atrium was moderately dilated. - Tricuspid valve: There was mild-moderate regurgitation. - Pulmonary arteries: Pulmonary pressures may be underestimated given the degree of right atrial enlargement. There are likely mildly elevated pulmonary pressures.    Assessment and Plan  1. Afib -no significant symptoms, continue atenolol for rate control - will resend eliquis Rx to pharmacy. Emphasized importance of bid dosing. CHADS2Vasc score is 40 (age, HTN,DM2)  2. HTN - reports cough with lisionpril. Unclear if tried ARB. I also do not have baseline Cr on him - will request pcp recent labs. Would consider ARB if not contraindicated. - bp above goal in clinic, he will keep bp log x 7 days and submit.  3. Hyperlipidemia - muscle ache on statins. Will request lipid panel from pcp. He is not in favor of meds in general and cost is an issue, unlikely to consider zetia for him.   [name] [name], M.D.

## 2015-01-11 NOTE — Patient Instructions (Signed)
Medication Instructions:  Your physician recommends that you continue on your current medications as directed. Please refer to the Current Medication list given to you today.  Labwork: None ordered.  We will obtain your labs from your primary care provider.  Testing/Procedures: None ordered  Follow-Up: Your physician wants you to follow-up in: 6 months with Dr. Harl Bowie. You will receive a reminder letter in the mail two months in advance. If you don't receive a letter, please call our office to schedule the follow-up appointment.  Any Other Special Instructions Will Be Listed Below (If Applicable). Please keep a blood pressure log for 1 week.  Please call the office with results or drop them off.  Thank you for choosing [facility]!!

## 2015-03-01 DIAGNOSIS — L97521 Non-pressure chronic ulcer of other part of left foot limited to breakdown of skin: Secondary | ICD-10-CM | POA: Diagnosis not present

## 2015-03-15 DIAGNOSIS — L97521 Non-pressure chronic ulcer of other part of left foot limited to breakdown of skin: Secondary | ICD-10-CM | POA: Diagnosis not present

## 2015-03-17 DIAGNOSIS — I4891 Unspecified atrial fibrillation: Secondary | ICD-10-CM | POA: Diagnosis not present

## 2015-03-17 DIAGNOSIS — L97509 Non-pressure chronic ulcer of other part of unspecified foot with unspecified severity: Secondary | ICD-10-CM | POA: Diagnosis not present

## 2015-03-17 DIAGNOSIS — I10 Essential (primary) hypertension: Secondary | ICD-10-CM | POA: Diagnosis not present

## 2015-03-17 DIAGNOSIS — E1140 Type 2 diabetes mellitus with diabetic neuropathy, unspecified: Secondary | ICD-10-CM | POA: Diagnosis not present

## 2015-04-26 DIAGNOSIS — L97521 Non-pressure chronic ulcer of other part of left foot limited to breakdown of skin: Secondary | ICD-10-CM | POA: Diagnosis not present

## 2015-06-07 DIAGNOSIS — E1142 Type 2 diabetes mellitus with diabetic polyneuropathy: Secondary | ICD-10-CM | POA: Diagnosis not present

## 2015-06-15 DIAGNOSIS — Z0000 Encounter for general adult medical examination without abnormal findings: Secondary | ICD-10-CM | POA: Diagnosis not present

## 2015-06-22 DIAGNOSIS — E785 Hyperlipidemia, unspecified: Secondary | ICD-10-CM | POA: Diagnosis not present

## 2015-06-22 DIAGNOSIS — Z0000 Encounter for general adult medical examination without abnormal findings: Secondary | ICD-10-CM | POA: Diagnosis not present

## 2015-06-22 DIAGNOSIS — B351 Tinea unguium: Secondary | ICD-10-CM | POA: Diagnosis not present

## 2015-06-22 DIAGNOSIS — G2581 Restless legs syndrome: Secondary | ICD-10-CM | POA: Diagnosis not present

## 2015-06-22 DIAGNOSIS — E1140 Type 2 diabetes mellitus with diabetic neuropathy, unspecified: Secondary | ICD-10-CM | POA: Diagnosis not present

## 2015-06-22 DIAGNOSIS — I10 Essential (primary) hypertension: Secondary | ICD-10-CM | POA: Diagnosis not present

## 2015-06-24 DIAGNOSIS — B351 Tinea unguium: Secondary | ICD-10-CM | POA: Diagnosis not present

## 2015-06-24 DIAGNOSIS — G2581 Restless legs syndrome: Secondary | ICD-10-CM | POA: Diagnosis not present

## 2015-06-24 DIAGNOSIS — Z0000 Encounter for general adult medical examination without abnormal findings: Secondary | ICD-10-CM | POA: Diagnosis not present

## 2015-06-24 DIAGNOSIS — E785 Hyperlipidemia, unspecified: Secondary | ICD-10-CM | POA: Diagnosis not present

## 2015-06-24 DIAGNOSIS — I10 Essential (primary) hypertension: Secondary | ICD-10-CM | POA: Diagnosis not present

## 2015-06-24 LAB — MICROALBUMIN, URINE: Microalb, Ur: 4.8

## 2015-08-16 DIAGNOSIS — L84 Corns and callosities: Secondary | ICD-10-CM | POA: Diagnosis not present

## 2015-08-16 DIAGNOSIS — E1142 Type 2 diabetes mellitus with diabetic polyneuropathy: Secondary | ICD-10-CM | POA: Diagnosis not present

## 2015-08-16 DIAGNOSIS — B351 Tinea unguium: Secondary | ICD-10-CM | POA: Diagnosis not present

## 2015-09-22 DIAGNOSIS — E1140 Type 2 diabetes mellitus with diabetic neuropathy, unspecified: Secondary | ICD-10-CM | POA: Diagnosis not present

## 2015-09-22 DIAGNOSIS — I10 Essential (primary) hypertension: Secondary | ICD-10-CM | POA: Diagnosis not present

## 2015-09-22 DIAGNOSIS — I4891 Unspecified atrial fibrillation: Secondary | ICD-10-CM | POA: Diagnosis not present

## 2015-09-22 DIAGNOSIS — E1169 Type 2 diabetes mellitus with other specified complication: Secondary | ICD-10-CM | POA: Diagnosis not present

## 2015-10-20 DIAGNOSIS — E1142 Type 2 diabetes mellitus with diabetic polyneuropathy: Secondary | ICD-10-CM | POA: Diagnosis not present

## 2015-10-20 DIAGNOSIS — B351 Tinea unguium: Secondary | ICD-10-CM | POA: Diagnosis not present

## 2015-10-20 DIAGNOSIS — L84 Corns and callosities: Secondary | ICD-10-CM | POA: Diagnosis not present

## 2015-12-12 DIAGNOSIS — I10 Essential (primary) hypertension: Secondary | ICD-10-CM | POA: Diagnosis not present

## 2015-12-12 DIAGNOSIS — I4891 Unspecified atrial fibrillation: Secondary | ICD-10-CM | POA: Diagnosis not present

## 2015-12-12 DIAGNOSIS — E1169 Type 2 diabetes mellitus with other specified complication: Secondary | ICD-10-CM | POA: Diagnosis not present

## 2015-12-12 DIAGNOSIS — E1140 Type 2 diabetes mellitus with diabetic neuropathy, unspecified: Secondary | ICD-10-CM | POA: Diagnosis not present

## 2015-12-12 DIAGNOSIS — L97509 Non-pressure chronic ulcer of other part of unspecified foot with unspecified severity: Secondary | ICD-10-CM | POA: Diagnosis not present

## 2015-12-22 DIAGNOSIS — E1142 Type 2 diabetes mellitus with diabetic polyneuropathy: Secondary | ICD-10-CM | POA: Diagnosis not present

## 2015-12-22 DIAGNOSIS — B351 Tinea unguium: Secondary | ICD-10-CM | POA: Diagnosis not present

## 2015-12-22 DIAGNOSIS — L84 Corns and callosities: Secondary | ICD-10-CM | POA: Diagnosis not present

## 2015-12-23 DIAGNOSIS — C61 Malignant neoplasm of prostate: Secondary | ICD-10-CM | POA: Diagnosis not present

## 2015-12-23 DIAGNOSIS — I10 Essential (primary) hypertension: Secondary | ICD-10-CM | POA: Diagnosis not present

## 2015-12-23 DIAGNOSIS — E1140 Type 2 diabetes mellitus with diabetic neuropathy, unspecified: Secondary | ICD-10-CM | POA: Diagnosis not present

## 2015-12-23 DIAGNOSIS — Z23 Encounter for immunization: Secondary | ICD-10-CM | POA: Diagnosis not present

## 2015-12-23 DIAGNOSIS — I4891 Unspecified atrial fibrillation: Secondary | ICD-10-CM | POA: Diagnosis not present

## 2015-12-26 DIAGNOSIS — H524 Presbyopia: Secondary | ICD-10-CM | POA: Diagnosis not present

## 2015-12-26 DIAGNOSIS — E11319 Type 2 diabetes mellitus with unspecified diabetic retinopathy without macular edema: Secondary | ICD-10-CM | POA: Diagnosis not present

## 2016-05-10 DIAGNOSIS — E1142 Type 2 diabetes mellitus with diabetic polyneuropathy: Secondary | ICD-10-CM | POA: Diagnosis not present

## 2016-05-10 DIAGNOSIS — L84 Corns and callosities: Secondary | ICD-10-CM | POA: Diagnosis not present

## 2016-05-10 DIAGNOSIS — B351 Tinea unguium: Secondary | ICD-10-CM | POA: Diagnosis not present

## 2016-06-18 DIAGNOSIS — G2581 Restless legs syndrome: Secondary | ICD-10-CM | POA: Diagnosis not present

## 2016-06-18 DIAGNOSIS — E1169 Type 2 diabetes mellitus with other specified complication: Secondary | ICD-10-CM | POA: Diagnosis not present

## 2016-06-18 DIAGNOSIS — E1140 Type 2 diabetes mellitus with diabetic neuropathy, unspecified: Secondary | ICD-10-CM | POA: Diagnosis not present

## 2016-06-18 DIAGNOSIS — Z0000 Encounter for general adult medical examination without abnormal findings: Secondary | ICD-10-CM | POA: Diagnosis not present

## 2016-06-18 DIAGNOSIS — I10 Essential (primary) hypertension: Secondary | ICD-10-CM | POA: Diagnosis not present

## 2016-06-20 DIAGNOSIS — Z0000 Encounter for general adult medical examination without abnormal findings: Secondary | ICD-10-CM | POA: Diagnosis not present

## 2016-07-19 DIAGNOSIS — B351 Tinea unguium: Secondary | ICD-10-CM | POA: Diagnosis not present

## 2016-07-19 DIAGNOSIS — E1142 Type 2 diabetes mellitus with diabetic polyneuropathy: Secondary | ICD-10-CM | POA: Diagnosis not present

## 2016-07-19 DIAGNOSIS — M79676 Pain in unspecified toe(s): Secondary | ICD-10-CM | POA: Diagnosis not present

## 2016-07-19 DIAGNOSIS — L84 Corns and callosities: Secondary | ICD-10-CM | POA: Diagnosis not present

## 2016-11-08 DIAGNOSIS — E1142 Type 2 diabetes mellitus with diabetic polyneuropathy: Secondary | ICD-10-CM | POA: Diagnosis not present

## 2016-11-08 DIAGNOSIS — B351 Tinea unguium: Secondary | ICD-10-CM | POA: Diagnosis not present

## 2016-11-08 DIAGNOSIS — M79676 Pain in unspecified toe(s): Secondary | ICD-10-CM | POA: Diagnosis not present

## 2016-11-08 DIAGNOSIS — L84 Corns and callosities: Secondary | ICD-10-CM | POA: Diagnosis not present

## 2016-12-13 DIAGNOSIS — C61 Malignant neoplasm of prostate: Secondary | ICD-10-CM | POA: Diagnosis not present

## 2016-12-13 DIAGNOSIS — Z0000 Encounter for general adult medical examination without abnormal findings: Secondary | ICD-10-CM | POA: Diagnosis not present

## 2016-12-13 DIAGNOSIS — E785 Hyperlipidemia, unspecified: Secondary | ICD-10-CM | POA: Diagnosis not present

## 2016-12-13 DIAGNOSIS — B351 Tinea unguium: Secondary | ICD-10-CM | POA: Diagnosis not present

## 2016-12-13 DIAGNOSIS — I4891 Unspecified atrial fibrillation: Secondary | ICD-10-CM | POA: Diagnosis not present

## 2016-12-20 DIAGNOSIS — E1140 Type 2 diabetes mellitus with diabetic neuropathy, unspecified: Secondary | ICD-10-CM | POA: Diagnosis not present

## 2016-12-20 DIAGNOSIS — E1169 Type 2 diabetes mellitus with other specified complication: Secondary | ICD-10-CM | POA: Diagnosis not present

## 2016-12-20 DIAGNOSIS — I10 Essential (primary) hypertension: Secondary | ICD-10-CM | POA: Diagnosis not present

## 2016-12-20 DIAGNOSIS — C61 Malignant neoplasm of prostate: Secondary | ICD-10-CM | POA: Diagnosis not present

## 2016-12-20 DIAGNOSIS — Z23 Encounter for immunization: Secondary | ICD-10-CM | POA: Diagnosis not present

## 2017-01-03 ENCOUNTER — Ambulatory Visit (HOSPITAL_COMMUNITY)
Admission: RE | Admit: 2017-01-03 | Discharge: 2017-01-03 | Disposition: A | Discharge status: Home / Self Care | Payer: Medicare Other | Source: Ambulatory Visit | Attending: Pulmonary Disease | Admitting: Pulmonary Disease

## 2017-01-03 ENCOUNTER — Other Ambulatory Visit (HOSPITAL_COMMUNITY): Payer: Self-pay | Admitting: Pulmonary Disease

## 2017-01-03 DIAGNOSIS — M5442 Lumbago with sciatica, left side: Secondary | ICD-10-CM

## 2017-01-03 DIAGNOSIS — R937 Abnormal findings on diagnostic imaging of other parts of musculoskeletal system: Secondary | ICD-10-CM | POA: Diagnosis not present

## 2017-01-03 DIAGNOSIS — M545 Low back pain: Secondary | ICD-10-CM | POA: Diagnosis not present

## 2017-01-17 DIAGNOSIS — E1142 Type 2 diabetes mellitus with diabetic polyneuropathy: Secondary | ICD-10-CM | POA: Diagnosis not present

## 2017-01-17 DIAGNOSIS — M79676 Pain in unspecified toe(s): Secondary | ICD-10-CM | POA: Diagnosis not present

## 2017-01-17 DIAGNOSIS — B351 Tinea unguium: Secondary | ICD-10-CM | POA: Diagnosis not present

## 2017-01-17 DIAGNOSIS — L84 Corns and callosities: Secondary | ICD-10-CM | POA: Diagnosis not present

## 2017-03-21 DIAGNOSIS — L84 Corns and callosities: Secondary | ICD-10-CM | POA: Diagnosis not present

## 2017-03-21 DIAGNOSIS — E1142 Type 2 diabetes mellitus with diabetic polyneuropathy: Secondary | ICD-10-CM | POA: Diagnosis not present

## 2017-03-21 DIAGNOSIS — M79676 Pain in unspecified toe(s): Secondary | ICD-10-CM | POA: Diagnosis not present

## 2017-03-21 DIAGNOSIS — B351 Tinea unguium: Secondary | ICD-10-CM | POA: Diagnosis not present

## 2017-05-01 DIAGNOSIS — E11319 Type 2 diabetes mellitus with unspecified diabetic retinopathy without macular edema: Secondary | ICD-10-CM | POA: Diagnosis not present

## 2017-05-01 DIAGNOSIS — H524 Presbyopia: Secondary | ICD-10-CM | POA: Diagnosis not present

## 2017-06-06 DIAGNOSIS — B351 Tinea unguium: Secondary | ICD-10-CM | POA: Diagnosis not present

## 2017-06-06 DIAGNOSIS — M79676 Pain in unspecified toe(s): Secondary | ICD-10-CM | POA: Diagnosis not present

## 2017-06-06 DIAGNOSIS — E1142 Type 2 diabetes mellitus with diabetic polyneuropathy: Secondary | ICD-10-CM | POA: Diagnosis not present

## 2017-06-06 DIAGNOSIS — L84 Corns and callosities: Secondary | ICD-10-CM | POA: Diagnosis not present

## 2017-06-24 DIAGNOSIS — Z0000 Encounter for general adult medical examination without abnormal findings: Secondary | ICD-10-CM | POA: Diagnosis not present

## 2017-06-24 DIAGNOSIS — E1169 Type 2 diabetes mellitus with other specified complication: Secondary | ICD-10-CM | POA: Diagnosis not present

## 2017-06-24 DIAGNOSIS — E785 Hyperlipidemia, unspecified: Secondary | ICD-10-CM | POA: Diagnosis not present

## 2017-06-24 DIAGNOSIS — B361 Tinea nigra: Secondary | ICD-10-CM | POA: Diagnosis not present

## 2017-06-24 DIAGNOSIS — C61 Malignant neoplasm of prostate: Secondary | ICD-10-CM | POA: Diagnosis not present

## 2017-06-24 DIAGNOSIS — Z125 Encounter for screening for malignant neoplasm of prostate: Secondary | ICD-10-CM | POA: Diagnosis not present

## 2017-07-02 DIAGNOSIS — M47816 Spondylosis without myelopathy or radiculopathy, lumbar region: Secondary | ICD-10-CM | POA: Diagnosis not present

## 2017-07-02 DIAGNOSIS — M9903 Segmental and somatic dysfunction of lumbar region: Secondary | ICD-10-CM | POA: Diagnosis not present

## 2017-07-02 DIAGNOSIS — M545 Low back pain: Secondary | ICD-10-CM | POA: Diagnosis not present

## 2017-07-02 LAB — FECAL OCCULT BLOOD, GUAIAC: Fecal Occult Blood: NEGATIVE

## 2017-07-03 DIAGNOSIS — Z1211 Encounter for screening for malignant neoplasm of colon: Secondary | ICD-10-CM | POA: Diagnosis not present

## 2017-07-04 DIAGNOSIS — M9903 Segmental and somatic dysfunction of lumbar region: Secondary | ICD-10-CM | POA: Diagnosis not present

## 2017-07-04 DIAGNOSIS — M545 Low back pain: Secondary | ICD-10-CM | POA: Diagnosis not present

## 2017-07-04 DIAGNOSIS — M47816 Spondylosis without myelopathy or radiculopathy, lumbar region: Secondary | ICD-10-CM | POA: Diagnosis not present

## 2017-07-08 DIAGNOSIS — M9903 Segmental and somatic dysfunction of lumbar region: Secondary | ICD-10-CM | POA: Diagnosis not present

## 2017-07-08 DIAGNOSIS — M47816 Spondylosis without myelopathy or radiculopathy, lumbar region: Secondary | ICD-10-CM | POA: Diagnosis not present

## 2017-07-08 DIAGNOSIS — M545 Low back pain: Secondary | ICD-10-CM | POA: Diagnosis not present

## 2017-07-11 DIAGNOSIS — M9903 Segmental and somatic dysfunction of lumbar region: Secondary | ICD-10-CM | POA: Diagnosis not present

## 2017-07-11 DIAGNOSIS — M545 Low back pain: Secondary | ICD-10-CM | POA: Diagnosis not present

## 2017-07-11 DIAGNOSIS — M47816 Spondylosis without myelopathy or radiculopathy, lumbar region: Secondary | ICD-10-CM | POA: Diagnosis not present

## 2017-07-15 DIAGNOSIS — M9903 Segmental and somatic dysfunction of lumbar region: Secondary | ICD-10-CM | POA: Diagnosis not present

## 2017-07-15 DIAGNOSIS — M545 Low back pain: Secondary | ICD-10-CM | POA: Diagnosis not present

## 2017-07-15 DIAGNOSIS — M47816 Spondylosis without myelopathy or radiculopathy, lumbar region: Secondary | ICD-10-CM | POA: Diagnosis not present

## 2017-07-17 DIAGNOSIS — M47816 Spondylosis without myelopathy or radiculopathy, lumbar region: Secondary | ICD-10-CM | POA: Diagnosis not present

## 2017-07-17 DIAGNOSIS — M545 Low back pain: Secondary | ICD-10-CM | POA: Diagnosis not present

## 2017-07-17 DIAGNOSIS — M9903 Segmental and somatic dysfunction of lumbar region: Secondary | ICD-10-CM | POA: Diagnosis not present

## 2017-07-22 DIAGNOSIS — M545 Low back pain: Secondary | ICD-10-CM | POA: Diagnosis not present

## 2017-07-22 DIAGNOSIS — M9903 Segmental and somatic dysfunction of lumbar region: Secondary | ICD-10-CM | POA: Diagnosis not present

## 2017-07-22 DIAGNOSIS — M47816 Spondylosis without myelopathy or radiculopathy, lumbar region: Secondary | ICD-10-CM | POA: Diagnosis not present

## 2017-07-25 DIAGNOSIS — M545 Low back pain: Secondary | ICD-10-CM | POA: Diagnosis not present

## 2017-07-25 DIAGNOSIS — M9903 Segmental and somatic dysfunction of lumbar region: Secondary | ICD-10-CM | POA: Diagnosis not present

## 2017-07-25 DIAGNOSIS — M47816 Spondylosis without myelopathy or radiculopathy, lumbar region: Secondary | ICD-10-CM | POA: Diagnosis not present

## 2017-07-29 DIAGNOSIS — M545 Low back pain: Secondary | ICD-10-CM | POA: Diagnosis not present

## 2017-07-29 DIAGNOSIS — M47816 Spondylosis without myelopathy or radiculopathy, lumbar region: Secondary | ICD-10-CM | POA: Diagnosis not present

## 2017-07-29 DIAGNOSIS — M9903 Segmental and somatic dysfunction of lumbar region: Secondary | ICD-10-CM | POA: Diagnosis not present

## 2017-08-01 DIAGNOSIS — M9903 Segmental and somatic dysfunction of lumbar region: Secondary | ICD-10-CM | POA: Diagnosis not present

## 2017-08-01 DIAGNOSIS — M545 Low back pain: Secondary | ICD-10-CM | POA: Diagnosis not present

## 2017-08-01 DIAGNOSIS — M47816 Spondylosis without myelopathy or radiculopathy, lumbar region: Secondary | ICD-10-CM | POA: Diagnosis not present

## 2017-08-05 DIAGNOSIS — M47816 Spondylosis without myelopathy or radiculopathy, lumbar region: Secondary | ICD-10-CM | POA: Diagnosis not present

## 2017-08-05 DIAGNOSIS — M545 Low back pain: Secondary | ICD-10-CM | POA: Diagnosis not present

## 2017-08-05 DIAGNOSIS — M9903 Segmental and somatic dysfunction of lumbar region: Secondary | ICD-10-CM | POA: Diagnosis not present

## 2017-08-07 DIAGNOSIS — M47816 Spondylosis without myelopathy or radiculopathy, lumbar region: Secondary | ICD-10-CM | POA: Diagnosis not present

## 2017-08-07 DIAGNOSIS — M9903 Segmental and somatic dysfunction of lumbar region: Secondary | ICD-10-CM | POA: Diagnosis not present

## 2017-08-07 DIAGNOSIS — M545 Low back pain: Secondary | ICD-10-CM | POA: Diagnosis not present

## 2017-08-08 DIAGNOSIS — M79676 Pain in unspecified toe(s): Secondary | ICD-10-CM | POA: Diagnosis not present

## 2017-08-08 DIAGNOSIS — L84 Corns and callosities: Secondary | ICD-10-CM | POA: Diagnosis not present

## 2017-08-08 DIAGNOSIS — E1142 Type 2 diabetes mellitus with diabetic polyneuropathy: Secondary | ICD-10-CM | POA: Diagnosis not present

## 2017-08-08 DIAGNOSIS — B351 Tinea unguium: Secondary | ICD-10-CM | POA: Diagnosis not present

## 2017-08-09 DIAGNOSIS — M9903 Segmental and somatic dysfunction of lumbar region: Secondary | ICD-10-CM | POA: Diagnosis not present

## 2017-08-09 DIAGNOSIS — M47816 Spondylosis without myelopathy or radiculopathy, lumbar region: Secondary | ICD-10-CM | POA: Diagnosis not present

## 2017-08-09 DIAGNOSIS — M545 Low back pain: Secondary | ICD-10-CM | POA: Diagnosis not present

## 2017-08-12 DIAGNOSIS — M9903 Segmental and somatic dysfunction of lumbar region: Secondary | ICD-10-CM | POA: Diagnosis not present

## 2017-08-12 DIAGNOSIS — M47816 Spondylosis without myelopathy or radiculopathy, lumbar region: Secondary | ICD-10-CM | POA: Diagnosis not present

## 2017-08-12 DIAGNOSIS — M545 Low back pain: Secondary | ICD-10-CM | POA: Diagnosis not present

## 2017-10-22 DIAGNOSIS — M79676 Pain in unspecified toe(s): Secondary | ICD-10-CM | POA: Diagnosis not present

## 2017-10-22 DIAGNOSIS — L84 Corns and callosities: Secondary | ICD-10-CM | POA: Diagnosis not present

## 2017-10-22 DIAGNOSIS — E1142 Type 2 diabetes mellitus with diabetic polyneuropathy: Secondary | ICD-10-CM | POA: Diagnosis not present

## 2017-10-22 DIAGNOSIS — B351 Tinea unguium: Secondary | ICD-10-CM | POA: Diagnosis not present

## 2017-12-18 DIAGNOSIS — Z23 Encounter for immunization: Secondary | ICD-10-CM | POA: Diagnosis not present

## 2017-12-18 DIAGNOSIS — I4891 Unspecified atrial fibrillation: Secondary | ICD-10-CM | POA: Diagnosis not present

## 2017-12-18 DIAGNOSIS — I10 Essential (primary) hypertension: Secondary | ICD-10-CM | POA: Diagnosis not present

## 2017-12-18 DIAGNOSIS — E1140 Type 2 diabetes mellitus with diabetic neuropathy, unspecified: Secondary | ICD-10-CM | POA: Diagnosis not present

## 2017-12-18 DIAGNOSIS — E785 Hyperlipidemia, unspecified: Secondary | ICD-10-CM | POA: Diagnosis not present

## 2018-02-20 DIAGNOSIS — M9903 Segmental and somatic dysfunction of lumbar region: Secondary | ICD-10-CM | POA: Diagnosis not present

## 2018-02-20 DIAGNOSIS — S233XXA Sprain of ligaments of thoracic spine, initial encounter: Secondary | ICD-10-CM | POA: Diagnosis not present

## 2018-02-20 DIAGNOSIS — S338XXA Sprain of other parts of lumbar spine and pelvis, initial encounter: Secondary | ICD-10-CM | POA: Diagnosis not present

## 2018-02-21 DIAGNOSIS — S233XXA Sprain of ligaments of thoracic spine, initial encounter: Secondary | ICD-10-CM | POA: Diagnosis not present

## 2018-02-21 DIAGNOSIS — M9903 Segmental and somatic dysfunction of lumbar region: Secondary | ICD-10-CM | POA: Diagnosis not present

## 2018-02-21 DIAGNOSIS — S338XXA Sprain of other parts of lumbar spine and pelvis, initial encounter: Secondary | ICD-10-CM | POA: Diagnosis not present

## 2018-02-25 DIAGNOSIS — M9903 Segmental and somatic dysfunction of lumbar region: Secondary | ICD-10-CM | POA: Diagnosis not present

## 2018-02-25 DIAGNOSIS — S233XXA Sprain of ligaments of thoracic spine, initial encounter: Secondary | ICD-10-CM | POA: Diagnosis not present

## 2018-02-25 DIAGNOSIS — S338XXA Sprain of other parts of lumbar spine and pelvis, initial encounter: Secondary | ICD-10-CM | POA: Diagnosis not present

## 2018-02-26 DIAGNOSIS — S338XXA Sprain of other parts of lumbar spine and pelvis, initial encounter: Secondary | ICD-10-CM | POA: Diagnosis not present

## 2018-02-26 DIAGNOSIS — M9903 Segmental and somatic dysfunction of lumbar region: Secondary | ICD-10-CM | POA: Diagnosis not present

## 2018-02-26 DIAGNOSIS — S233XXA Sprain of ligaments of thoracic spine, initial encounter: Secondary | ICD-10-CM | POA: Diagnosis not present

## 2018-03-03 DIAGNOSIS — M9903 Segmental and somatic dysfunction of lumbar region: Secondary | ICD-10-CM | POA: Diagnosis not present

## 2018-03-03 DIAGNOSIS — S233XXA Sprain of ligaments of thoracic spine, initial encounter: Secondary | ICD-10-CM | POA: Diagnosis not present

## 2018-03-03 DIAGNOSIS — S338XXA Sprain of other parts of lumbar spine and pelvis, initial encounter: Secondary | ICD-10-CM | POA: Diagnosis not present

## 2018-03-06 DIAGNOSIS — M9903 Segmental and somatic dysfunction of lumbar region: Secondary | ICD-10-CM | POA: Diagnosis not present

## 2018-03-06 DIAGNOSIS — S233XXA Sprain of ligaments of thoracic spine, initial encounter: Secondary | ICD-10-CM | POA: Diagnosis not present

## 2018-03-06 DIAGNOSIS — S338XXA Sprain of other parts of lumbar spine and pelvis, initial encounter: Secondary | ICD-10-CM | POA: Diagnosis not present

## 2018-03-10 DIAGNOSIS — S338XXA Sprain of other parts of lumbar spine and pelvis, initial encounter: Secondary | ICD-10-CM | POA: Diagnosis not present

## 2018-03-10 DIAGNOSIS — M9903 Segmental and somatic dysfunction of lumbar region: Secondary | ICD-10-CM | POA: Diagnosis not present

## 2018-03-10 DIAGNOSIS — S233XXA Sprain of ligaments of thoracic spine, initial encounter: Secondary | ICD-10-CM | POA: Diagnosis not present

## 2018-07-28 DIAGNOSIS — H2513 Age-related nuclear cataract, bilateral: Secondary | ICD-10-CM | POA: Diagnosis not present

## 2018-07-28 DIAGNOSIS — H25013 Cortical age-related cataract, bilateral: Secondary | ICD-10-CM | POA: Diagnosis not present

## 2018-07-28 DIAGNOSIS — E119 Type 2 diabetes mellitus without complications: Secondary | ICD-10-CM | POA: Diagnosis not present

## 2018-07-28 DIAGNOSIS — H25043 Posterior subcapsular polar age-related cataract, bilateral: Secondary | ICD-10-CM | POA: Diagnosis not present

## 2018-08-13 DIAGNOSIS — E785 Hyperlipidemia, unspecified: Secondary | ICD-10-CM | POA: Diagnosis not present

## 2018-08-13 DIAGNOSIS — Z0000 Encounter for general adult medical examination without abnormal findings: Secondary | ICD-10-CM | POA: Diagnosis not present

## 2018-08-13 DIAGNOSIS — T50905A Adverse effect of unspecified drugs, medicaments and biological substances, initial encounter: Secondary | ICD-10-CM | POA: Diagnosis not present

## 2018-08-13 DIAGNOSIS — E1140 Type 2 diabetes mellitus with diabetic neuropathy, unspecified: Secondary | ICD-10-CM | POA: Diagnosis not present

## 2018-08-13 DIAGNOSIS — E1169 Type 2 diabetes mellitus with other specified complication: Secondary | ICD-10-CM | POA: Diagnosis not present

## 2018-08-13 LAB — COMPREHENSIVE METABOLIC PANEL
Albumin: 4.3 (ref 3.5–5.0)
Calcium: 9.8 (ref 8.7–10.7)
GFR calc Af Amer: 74
GFR calc non Af Amer: 64
Globulin: 4.8

## 2018-08-13 LAB — CBC AND DIFFERENTIAL
HCT: 46 (ref 41–53)
Hemoglobin: 16.6 (ref 13.5–17.5)
Platelets: 302 (ref 150–399)
WBC: 10.8

## 2018-08-13 LAB — LIPID PANEL
Cholesterol: 204 — AB (ref 0–200)
HDL: 41 (ref 35–70)
LDL Cholesterol: 75
Triglycerides: 35 — AB (ref 40–160)

## 2018-08-13 LAB — BASIC METABOLIC PANEL
BUN: 22 — AB (ref 4–21)
CO2: 25 — AB (ref 13–22)
Chloride: 96 — AB (ref 99–108)
Creatinine: 1.1 (ref <=1.3)
Glucose: 168
Potassium: 3.8 (ref 3.4–5.3)
Sodium: 137 (ref 137–147)

## 2018-08-13 LAB — HM DIABETES FOOT EXAM

## 2018-08-13 LAB — CBC: RBC: 4.93 (ref 3.87–5.11)

## 2018-08-13 LAB — HEPATIC FUNCTION PANEL
ALT: 13 (ref 10–40)
AST: 14 (ref 14–40)
Alkaline Phosphatase: 97 (ref 25–125)
Bilirubin, Total: 1

## 2018-08-13 LAB — TSH: TSH: 3.52 (ref <=5.90)

## 2018-09-02 DIAGNOSIS — H25812 Combined forms of age-related cataract, left eye: Secondary | ICD-10-CM | POA: Diagnosis not present

## 2018-09-02 DIAGNOSIS — H2512 Age-related nuclear cataract, left eye: Secondary | ICD-10-CM | POA: Diagnosis not present

## 2018-10-08 DIAGNOSIS — H25041 Posterior subcapsular polar age-related cataract, right eye: Secondary | ICD-10-CM | POA: Diagnosis not present

## 2018-10-08 DIAGNOSIS — H2511 Age-related nuclear cataract, right eye: Secondary | ICD-10-CM | POA: Diagnosis not present

## 2018-10-08 DIAGNOSIS — H25011 Cortical age-related cataract, right eye: Secondary | ICD-10-CM | POA: Diagnosis not present

## 2018-10-20 ENCOUNTER — Other Ambulatory Visit: Payer: Self-pay

## 2018-10-20 NOTE — Patient Outreach (Signed)
[facility]) Care Management  10/20/2018  [name] [name] 1945-09-28 [id number]   Medication Adherence call to [name] [name] [facility] Identifiers Verify spoke with patient he is past due on Metformin  500 mg patient explain he was taking 1 tablet daily instead of 2 tablets daily because his diabetes was control patient explain he is now taking 2 tablets  Daily patient has enough until the end of the month and will order then. [name] [name] is showing past due under [place].   [facility] Management Direct Dial [phone]  Fax [phone] Tag [email or web address]

## 2018-11-13 DIAGNOSIS — I4891 Unspecified atrial fibrillation: Secondary | ICD-10-CM | POA: Diagnosis not present

## 2018-11-13 DIAGNOSIS — I10 Essential (primary) hypertension: Secondary | ICD-10-CM | POA: Diagnosis not present

## 2018-11-13 DIAGNOSIS — C430 Malignant melanoma of lip: Secondary | ICD-10-CM | POA: Diagnosis not present

## 2018-11-13 DIAGNOSIS — E1140 Type 2 diabetes mellitus with diabetic neuropathy, unspecified: Secondary | ICD-10-CM | POA: Diagnosis not present

## 2018-11-13 DIAGNOSIS — L820 Inflamed seborrheic keratosis: Secondary | ICD-10-CM | POA: Diagnosis not present

## 2018-11-13 DIAGNOSIS — B078 Other viral warts: Secondary | ICD-10-CM | POA: Diagnosis not present

## 2018-11-13 DIAGNOSIS — E785 Hyperlipidemia, unspecified: Secondary | ICD-10-CM | POA: Diagnosis not present

## 2018-12-09 DIAGNOSIS — H25811 Combined forms of age-related cataract, right eye: Secondary | ICD-10-CM | POA: Diagnosis not present

## 2018-12-09 DIAGNOSIS — H2511 Age-related nuclear cataract, right eye: Secondary | ICD-10-CM | POA: Diagnosis not present

## 2018-12-09 DIAGNOSIS — H25041 Posterior subcapsular polar age-related cataract, right eye: Secondary | ICD-10-CM | POA: Diagnosis not present

## 2018-12-09 DIAGNOSIS — H25011 Cortical age-related cataract, right eye: Secondary | ICD-10-CM | POA: Diagnosis not present

## 2018-12-11 DIAGNOSIS — D0359 Melanoma in situ of other part of trunk: Secondary | ICD-10-CM | POA: Diagnosis not present

## 2018-12-11 DIAGNOSIS — C4339 Malignant melanoma of other parts of face: Secondary | ICD-10-CM | POA: Diagnosis not present

## 2018-12-16 DIAGNOSIS — D485 Neoplasm of uncertain behavior of skin: Secondary | ICD-10-CM | POA: Diagnosis not present

## 2018-12-17 DIAGNOSIS — C430 Malignant melanoma of lip: Secondary | ICD-10-CM | POA: Insufficient documentation

## 2018-12-25 DIAGNOSIS — L578 Other skin changes due to chronic exposure to nonionizing radiation: Secondary | ICD-10-CM | POA: Diagnosis not present

## 2018-12-25 DIAGNOSIS — L814 Other melanin hyperpigmentation: Secondary | ICD-10-CM | POA: Diagnosis not present

## 2018-12-25 DIAGNOSIS — L988 Other specified disorders of the skin and subcutaneous tissue: Secondary | ICD-10-CM | POA: Diagnosis not present

## 2018-12-25 DIAGNOSIS — C430 Malignant melanoma of lip: Secondary | ICD-10-CM | POA: Diagnosis not present

## 2019-01-08 DIAGNOSIS — Z9849 Cataract extraction status, unspecified eye: Secondary | ICD-10-CM | POA: Diagnosis not present

## 2019-01-08 DIAGNOSIS — H524 Presbyopia: Secondary | ICD-10-CM | POA: Diagnosis not present

## 2019-01-15 DIAGNOSIS — D485 Neoplasm of uncertain behavior of skin: Secondary | ICD-10-CM | POA: Diagnosis not present

## 2019-01-15 DIAGNOSIS — D0359 Melanoma in situ of other part of trunk: Secondary | ICD-10-CM | POA: Diagnosis not present

## 2019-01-29 DIAGNOSIS — I4891 Unspecified atrial fibrillation: Secondary | ICD-10-CM | POA: Diagnosis not present

## 2019-01-29 DIAGNOSIS — Z23 Encounter for immunization: Secondary | ICD-10-CM | POA: Diagnosis not present

## 2019-01-29 DIAGNOSIS — E1140 Type 2 diabetes mellitus with diabetic neuropathy, unspecified: Secondary | ICD-10-CM | POA: Diagnosis not present

## 2019-01-29 LAB — PSA: PSA: <0.1

## 2019-01-29 LAB — HEMOGLOBIN A1C: Hemoglobin A1C: 7.6

## 2019-02-12 DIAGNOSIS — Z1211 Encounter for screening for malignant neoplasm of colon: Secondary | ICD-10-CM | POA: Diagnosis not present

## 2019-02-13 DIAGNOSIS — E1165 Type 2 diabetes mellitus with hyperglycemia: Secondary | ICD-10-CM | POA: Diagnosis not present

## 2019-02-13 DIAGNOSIS — C439 Malignant melanoma of skin, unspecified: Secondary | ICD-10-CM | POA: Diagnosis not present

## 2019-02-13 DIAGNOSIS — E1140 Type 2 diabetes mellitus with diabetic neuropathy, unspecified: Secondary | ICD-10-CM | POA: Diagnosis not present

## 2019-02-13 DIAGNOSIS — I10 Essential (primary) hypertension: Secondary | ICD-10-CM | POA: Diagnosis not present

## 2019-03-21 DIAGNOSIS — E1140 Type 2 diabetes mellitus with diabetic neuropathy, unspecified: Secondary | ICD-10-CM | POA: Insufficient documentation

## 2019-03-21 DIAGNOSIS — C439 Malignant melanoma of skin, unspecified: Secondary | ICD-10-CM

## 2019-03-21 DIAGNOSIS — G2581 Restless legs syndrome: Secondary | ICD-10-CM

## 2019-03-21 DIAGNOSIS — E785 Hyperlipidemia, unspecified: Secondary | ICD-10-CM

## 2019-03-21 DIAGNOSIS — I4891 Unspecified atrial fibrillation: Secondary | ICD-10-CM

## 2019-03-21 DIAGNOSIS — B351 Tinea unguium: Secondary | ICD-10-CM

## 2019-03-21 DIAGNOSIS — E1169 Type 2 diabetes mellitus with other specified complication: Secondary | ICD-10-CM

## 2019-03-21 DIAGNOSIS — T50905A Adverse effect of unspecified drugs, medicaments and biological substances, initial encounter: Secondary | ICD-10-CM

## 2019-03-21 DIAGNOSIS — I10 Essential (primary) hypertension: Secondary | ICD-10-CM

## 2019-03-21 DIAGNOSIS — C61 Malignant neoplasm of prostate: Secondary | ICD-10-CM

## 2019-03-21 DIAGNOSIS — L97509 Non-pressure chronic ulcer of other part of unspecified foot with unspecified severity: Secondary | ICD-10-CM

## 2019-07-02 ENCOUNTER — Ambulatory Visit: Payer: Medicare Other | Admitting: Family Medicine

## 2019-07-23 DIAGNOSIS — L97511 Non-pressure chronic ulcer of other part of right foot limited to breakdown of skin: Secondary | ICD-10-CM | POA: Diagnosis not present

## 2019-07-24 DIAGNOSIS — E1169 Type 2 diabetes mellitus with other specified complication: Secondary | ICD-10-CM | POA: Diagnosis not present

## 2019-07-24 DIAGNOSIS — Z0189 Encounter for other specified special examinations: Secondary | ICD-10-CM | POA: Diagnosis not present

## 2019-07-24 DIAGNOSIS — E1140 Type 2 diabetes mellitus with diabetic neuropathy, unspecified: Secondary | ICD-10-CM | POA: Diagnosis not present

## 2019-07-24 DIAGNOSIS — I10 Essential (primary) hypertension: Secondary | ICD-10-CM | POA: Diagnosis not present

## 2019-07-24 DIAGNOSIS — K219 Gastro-esophageal reflux disease without esophagitis: Secondary | ICD-10-CM | POA: Diagnosis not present

## 2019-07-30 DIAGNOSIS — Z1283 Encounter for screening for malignant neoplasm of skin: Secondary | ICD-10-CM | POA: Diagnosis not present

## 2019-07-30 DIAGNOSIS — L821 Other seborrheic keratosis: Secondary | ICD-10-CM | POA: Diagnosis not present

## 2019-07-30 DIAGNOSIS — Z85820 Personal history of malignant melanoma of skin: Secondary | ICD-10-CM | POA: Diagnosis not present

## 2019-07-30 DIAGNOSIS — Z08 Encounter for follow-up examination after completed treatment for malignant neoplasm: Secondary | ICD-10-CM | POA: Diagnosis not present

## 2019-07-30 DIAGNOSIS — L570 Actinic keratosis: Secondary | ICD-10-CM | POA: Diagnosis not present

## 2019-08-07 DIAGNOSIS — R7301 Impaired fasting glucose: Secondary | ICD-10-CM | POA: Diagnosis not present

## 2019-08-07 DIAGNOSIS — C439 Malignant melanoma of skin, unspecified: Secondary | ICD-10-CM | POA: Diagnosis not present

## 2019-08-07 DIAGNOSIS — E1140 Type 2 diabetes mellitus with diabetic neuropathy, unspecified: Secondary | ICD-10-CM | POA: Diagnosis not present

## 2019-08-07 DIAGNOSIS — I10 Essential (primary) hypertension: Secondary | ICD-10-CM | POA: Diagnosis not present

## 2019-08-07 DIAGNOSIS — E1169 Type 2 diabetes mellitus with other specified complication: Secondary | ICD-10-CM | POA: Diagnosis not present

## 2019-08-07 DIAGNOSIS — Z7689 Persons encountering health services in other specified circumstances: Secondary | ICD-10-CM | POA: Diagnosis not present

## 2019-08-07 DIAGNOSIS — H2589 Other age-related cataract: Secondary | ICD-10-CM | POA: Diagnosis not present

## 2019-08-12 DIAGNOSIS — Z0189 Encounter for other specified special examinations: Secondary | ICD-10-CM | POA: Diagnosis not present

## 2019-08-12 DIAGNOSIS — I10 Essential (primary) hypertension: Secondary | ICD-10-CM | POA: Diagnosis not present

## 2019-08-12 DIAGNOSIS — E1169 Type 2 diabetes mellitus with other specified complication: Secondary | ICD-10-CM | POA: Diagnosis not present

## 2019-08-12 DIAGNOSIS — E1140 Type 2 diabetes mellitus with diabetic neuropathy, unspecified: Secondary | ICD-10-CM | POA: Diagnosis not present

## 2019-08-12 DIAGNOSIS — K219 Gastro-esophageal reflux disease without esophagitis: Secondary | ICD-10-CM | POA: Diagnosis not present

## 2019-09-17 DIAGNOSIS — B351 Tinea unguium: Secondary | ICD-10-CM | POA: Diagnosis not present

## 2019-09-17 DIAGNOSIS — M79676 Pain in unspecified toe(s): Secondary | ICD-10-CM | POA: Diagnosis not present

## 2019-11-12 DIAGNOSIS — Z1283 Encounter for screening for malignant neoplasm of skin: Secondary | ICD-10-CM | POA: Diagnosis not present

## 2019-11-12 DIAGNOSIS — D225 Melanocytic nevi of trunk: Secondary | ICD-10-CM | POA: Diagnosis not present

## 2019-11-12 DIAGNOSIS — Z08 Encounter for follow-up examination after completed treatment for malignant neoplasm: Secondary | ICD-10-CM | POA: Diagnosis not present

## 2019-11-12 DIAGNOSIS — Z85820 Personal history of malignant melanoma of skin: Secondary | ICD-10-CM | POA: Diagnosis not present

## 2019-11-12 DIAGNOSIS — D485 Neoplasm of uncertain behavior of skin: Secondary | ICD-10-CM | POA: Diagnosis not present

## 2019-11-20 DIAGNOSIS — I10 Essential (primary) hypertension: Secondary | ICD-10-CM | POA: Diagnosis not present

## 2019-11-20 DIAGNOSIS — E1140 Type 2 diabetes mellitus with diabetic neuropathy, unspecified: Secondary | ICD-10-CM | POA: Diagnosis not present

## 2019-11-20 DIAGNOSIS — Z0189 Encounter for other specified special examinations: Secondary | ICD-10-CM | POA: Diagnosis not present

## 2019-11-20 DIAGNOSIS — E1169 Type 2 diabetes mellitus with other specified complication: Secondary | ICD-10-CM | POA: Diagnosis not present

## 2019-11-20 DIAGNOSIS — K219 Gastro-esophageal reflux disease without esophagitis: Secondary | ICD-10-CM | POA: Diagnosis not present

## 2019-11-24 DIAGNOSIS — E1169 Type 2 diabetes mellitus with other specified complication: Secondary | ICD-10-CM | POA: Diagnosis not present

## 2019-11-24 DIAGNOSIS — E1140 Type 2 diabetes mellitus with diabetic neuropathy, unspecified: Secondary | ICD-10-CM | POA: Diagnosis not present

## 2019-11-24 DIAGNOSIS — I10 Essential (primary) hypertension: Secondary | ICD-10-CM | POA: Diagnosis not present

## 2019-11-24 DIAGNOSIS — K219 Gastro-esophageal reflux disease without esophagitis: Secondary | ICD-10-CM | POA: Diagnosis not present

## 2019-11-24 DIAGNOSIS — H2589 Other age-related cataract: Secondary | ICD-10-CM | POA: Diagnosis not present

## 2019-11-26 DIAGNOSIS — E1142 Type 2 diabetes mellitus with diabetic polyneuropathy: Secondary | ICD-10-CM | POA: Diagnosis not present

## 2019-11-26 DIAGNOSIS — B351 Tinea unguium: Secondary | ICD-10-CM | POA: Diagnosis not present

## 2019-11-26 DIAGNOSIS — M79676 Pain in unspecified toe(s): Secondary | ICD-10-CM | POA: Diagnosis not present

## 2019-11-26 DIAGNOSIS — L84 Corns and callosities: Secondary | ICD-10-CM | POA: Diagnosis not present

## 2019-12-02 DIAGNOSIS — Z9849 Cataract extraction status, unspecified eye: Secondary | ICD-10-CM | POA: Diagnosis not present

## 2019-12-02 DIAGNOSIS — E119 Type 2 diabetes mellitus without complications: Secondary | ICD-10-CM | POA: Diagnosis not present

## 2020-01-28 DIAGNOSIS — M79676 Pain in unspecified toe(s): Secondary | ICD-10-CM | POA: Diagnosis not present

## 2020-01-28 DIAGNOSIS — B351 Tinea unguium: Secondary | ICD-10-CM | POA: Diagnosis not present

## 2020-01-28 DIAGNOSIS — L84 Corns and callosities: Secondary | ICD-10-CM | POA: Diagnosis not present

## 2020-01-28 DIAGNOSIS — E1142 Type 2 diabetes mellitus with diabetic polyneuropathy: Secondary | ICD-10-CM | POA: Diagnosis not present

## 2020-02-11 DIAGNOSIS — D225 Melanocytic nevi of trunk: Secondary | ICD-10-CM | POA: Diagnosis not present

## 2020-02-11 DIAGNOSIS — L570 Actinic keratosis: Secondary | ICD-10-CM | POA: Diagnosis not present

## 2020-02-11 DIAGNOSIS — Z85820 Personal history of malignant melanoma of skin: Secondary | ICD-10-CM | POA: Diagnosis not present

## 2020-02-11 DIAGNOSIS — Z08 Encounter for follow-up examination after completed treatment for malignant neoplasm: Secondary | ICD-10-CM | POA: Diagnosis not present

## 2020-02-11 DIAGNOSIS — Z1283 Encounter for screening for malignant neoplasm of skin: Secondary | ICD-10-CM | POA: Diagnosis not present

## 2020-03-18 DIAGNOSIS — Z0189 Encounter for other specified special examinations: Secondary | ICD-10-CM | POA: Diagnosis not present

## 2020-03-18 DIAGNOSIS — E1169 Type 2 diabetes mellitus with other specified complication: Secondary | ICD-10-CM | POA: Diagnosis not present

## 2020-03-18 DIAGNOSIS — K219 Gastro-esophageal reflux disease without esophagitis: Secondary | ICD-10-CM | POA: Diagnosis not present

## 2020-03-18 DIAGNOSIS — I10 Essential (primary) hypertension: Secondary | ICD-10-CM | POA: Diagnosis not present

## 2020-03-18 DIAGNOSIS — E1140 Type 2 diabetes mellitus with diabetic neuropathy, unspecified: Secondary | ICD-10-CM | POA: Diagnosis not present

## 2020-03-22 DIAGNOSIS — Z0001 Encounter for general adult medical examination with abnormal findings: Secondary | ICD-10-CM | POA: Diagnosis not present

## 2020-03-22 DIAGNOSIS — I10 Essential (primary) hypertension: Secondary | ICD-10-CM | POA: Diagnosis not present

## 2020-03-22 DIAGNOSIS — E1140 Type 2 diabetes mellitus with diabetic neuropathy, unspecified: Secondary | ICD-10-CM | POA: Diagnosis not present

## 2020-03-22 DIAGNOSIS — H2589 Other age-related cataract: Secondary | ICD-10-CM | POA: Diagnosis not present

## 2020-03-22 DIAGNOSIS — C439 Malignant melanoma of skin, unspecified: Secondary | ICD-10-CM | POA: Diagnosis not present

## 2020-03-22 DIAGNOSIS — K219 Gastro-esophageal reflux disease without esophagitis: Secondary | ICD-10-CM | POA: Diagnosis not present

## 2020-03-30 DIAGNOSIS — E1140 Type 2 diabetes mellitus with diabetic neuropathy, unspecified: Secondary | ICD-10-CM | POA: Diagnosis not present

## 2020-03-30 DIAGNOSIS — I10 Essential (primary) hypertension: Secondary | ICD-10-CM | POA: Diagnosis not present

## 2020-03-30 DIAGNOSIS — K219 Gastro-esophageal reflux disease without esophagitis: Secondary | ICD-10-CM | POA: Diagnosis not present

## 2020-03-30 DIAGNOSIS — Z0189 Encounter for other specified special examinations: Secondary | ICD-10-CM | POA: Diagnosis not present

## 2020-03-30 DIAGNOSIS — E1169 Type 2 diabetes mellitus with other specified complication: Secondary | ICD-10-CM | POA: Diagnosis not present

## 2020-04-07 DIAGNOSIS — L84 Corns and callosities: Secondary | ICD-10-CM | POA: Diagnosis not present

## 2020-04-07 DIAGNOSIS — M79676 Pain in unspecified toe(s): Secondary | ICD-10-CM | POA: Diagnosis not present

## 2020-04-07 DIAGNOSIS — B351 Tinea unguium: Secondary | ICD-10-CM | POA: Diagnosis not present

## 2020-04-07 DIAGNOSIS — E1142 Type 2 diabetes mellitus with diabetic polyneuropathy: Secondary | ICD-10-CM | POA: Diagnosis not present

## 2020-05-13 ENCOUNTER — Inpatient Hospital Stay (HOSPITAL_COMMUNITY): Payer: Medicare Other | Attending: Hematology and Oncology | Admitting: Hematology and Oncology

## 2020-05-13 ENCOUNTER — Encounter (HOSPITAL_COMMUNITY): Payer: Self-pay | Admitting: Hematology and Oncology

## 2020-05-13 ENCOUNTER — Inpatient Hospital Stay (HOSPITAL_COMMUNITY): Payer: Medicare Other

## 2020-05-13 ENCOUNTER — Other Ambulatory Visit: Payer: Self-pay

## 2020-05-13 VITALS — BP 139/89 | HR 79 | Temp 97.5°F | Resp 17 | Ht 68.0 in | Wt 196.6 lb

## 2020-05-13 DIAGNOSIS — Z7984 Long term (current) use of oral hypoglycemic drugs: Secondary | ICD-10-CM | POA: Insufficient documentation

## 2020-05-13 DIAGNOSIS — D472 Monoclonal gammopathy: Secondary | ICD-10-CM

## 2020-05-13 DIAGNOSIS — E1140 Type 2 diabetes mellitus with diabetic neuropathy, unspecified: Secondary | ICD-10-CM | POA: Insufficient documentation

## 2020-05-13 LAB — CBC WITH DIFFERENTIAL/PLATELET
Abs Immature Granulocytes: 0.04 10*3/uL (ref 0.00–0.07)
Basophils Absolute: 0.1 10*3/uL (ref 0.0–0.1)
Basophils Relative: 1 %
Eosinophils Absolute: 0.8 10*3/uL — ABNORMAL HIGH (ref 0.0–0.5)
Eosinophils Relative: 8 %
HCT: 42.3 % (ref 39.0–52.0)
Hemoglobin: 14.9 g/dL (ref 13.0–17.0)
Immature Granulocytes: 0 %
Lymphocytes Relative: 26 %
Lymphs Abs: 2.5 10*3/uL (ref 0.7–4.0)
MCH: 33.8 pg (ref 26.0–34.0)
MCHC: 35.2 g/dL (ref 30.0–36.0)
MCV: 95.9 fL (ref 80.0–100.0)
Monocytes Absolute: 0.9 10*3/uL (ref 0.1–1.0)
Monocytes Relative: 9 %
Neutro Abs: 5.5 10*3/uL (ref 1.7–7.7)
Neutrophils Relative %: 56 %
Platelets: 266 10*3/uL (ref 150–400)
RBC: 4.41 MIL/uL (ref 4.22–5.81)
RDW: 13.9 % (ref 11.5–15.5)
WBC: 9.8 10*3/uL (ref 4.0–10.5)
nRBC: 0 % (ref 0.0–0.2)

## 2020-05-13 NOTE — Progress Notes (Signed)
[name] [name]  Patient Care Team: Celene Squibb, MD as PCP - General (Internal Medicine) Harl Bowie Alphonse Guild, MD as Consulting Physician (Cardiology)  CHIEF COMPLAINTS/PURPOSE OF CONSULTATION:   MGUS  ASSESSMENT & PLAN:  No problem-specific Assessment & Plan notes found for this encounter.  Orders Placed This Encounter  Procedures  . CT BONE MARROW BIOPSY & ASPIRATION    Standing Status:   Future    Standing Expiration Date:   05/13/2021    Order Specific Question:   Reason for Exam (SYMPTOM  OR DIAGNOSIS REQUIRED)    Answer:   myeloma staging    Order Specific Question:   Preferred imaging location?    Answer:   [facility]    Order Specific Question:   Radiology Contrast Protocol - do NOT remove file path    Answer:   \\[email or web address]\CTProtocols.pdf  . CT BIOPSY    Standing Status:   Future    Standing Expiration Date:   05/13/2021    Order Specific Question:   Lab orders requested (DO NOT place separate lab orders, these will be automatically ordered during procedure specimen collection):    Answer:   Surgical Pathology    Order Specific Question:   Reason for Exam (SYMPTOM  OR DIAGNOSIS REQUIRED)    Answer:   bone marrow, pt requesting some conscious sedation.    Order Specific Question:   Preferred imaging location?    Answer:   [facility]    Order Specific Question:   Radiology Contrast Protocol - do NOT remove file path    Answer:   \\[email or web address]\Radiant\CTProtocols.pdf  . DG Bone Survey Met    Standing Status:   Future    Standing Expiration Date:   05/13/2021    Order Specific Question:   Reason for Exam (SYMPTOM  OR DIAGNOSIS REQUIRED)    Answer:   staging myeloma    Order Specific Question:   Preferred imaging location?    Answer:   [facility]  . CBC with Differential/Platelet    Standing Status:   Standing    Number of Occurrences:   22    Standing Expiration Date:   05/13/2021  . SPEP (Serum  protein electrophoresis)    Standing Status:   Future    Number of Occurrences:   1    Standing Expiration Date:   05/13/2021  . Kappa/lambda light chains    Standing Status:   Future    Number of Occurrences:   1    Standing Expiration Date:   05/13/2021  . UPEP/TP, 24-Hr Urine    Standing Status:   Future    Standing Expiration Date:   05/13/2021  . IgG, IgA, IgM    Standing Status:   Future    Number of Occurrences:   1    Standing Expiration Date:   05/13/2021   #1.  IgG kappa MGUS diagnosed back in 2011.  He has had bone marrow biopsy, skeletal survey at that time which did not establish the presence of myeloma.  Most recent labs show progression of the IgG kappa monoclonal protein to about 2.8 g with elevated kappa lambda ratio 14.  I Have recommended repeating CBC, CMP, SPEP, kappa lambda ratio, immunoglobulin levels, UPEP, bone marrow biopsy and skeletal survey.  He was very reluctant about the bone marrow biopsy but agreed to it since this will change management.  If he has presence of smoldering multiple myeloma with any evidence of endorgan  damage, we can consider monitoring it.  If he has evidence of multiple myeloma, then we will discuss first-line treatment with VRD. He is agreeable to the above-mentioned recommendations, orders placed. Return to clinic in 3weeks 4 weeks to review labs and to discuss any additional recommendations.  Given consulting [place] in the care of this patient.  Please not hesitate to contact us with any additional questions or concerns  HISTORY OF PRESENTING ILLNESS:   [name] [name] 75 y.o. male is here because of MGUS  This is a very pleasant 75 year old male patient with known history of IgG kappa monoclonal gammopathy referred to hematology for additional recommendations.  Patient tells me that he was diagnosed with MGUS back in 2011 and has been following up with his primary care doctor with yearly labs.  He most recently had to switch doctors since  his doctor has retired.  He had repeat labs which showed monoclonal IgG protein measuring about 3.6 g and a kappa lambda ratio of 14 and hence referred back to hematology.  He arrived with his wife to the appointment.  He denies any new complaints.  No new B symptoms, bone pains, change in bowel habits.  He had a bone marrow biopsy back in 2011 or 2012 which showed monoclonal gammopathy without any evidence of smoldering myeloma or multiple myeloma at that time.  He had a skeletal survey also from what he remembers at the same time.  He has not had a bone marrow biopsy since then.  He has longstanding diabetes and neuropathy secondary to diabetes.  Rest of the pertinent 10 point ROS as mentioned below and negative.  REVIEW OF SYSTEMS:   Constitutional: Denies fevers, chills or abnormal night sweats Eyes: Denies blurriness of vision, double vision or watery eyes Ears, nose, mouth, throat, and face: Denies mucositis or sore throat Respiratory: Denies cough, dyspnea or wheezes Cardiovascular: Denies palpitation, chest discomfort or lower extremity swelling Gastrointestinal:  Denies nausea, heartburn or change in bowel habits Skin: Denies abnormal skin rashes Lymphatics: Denies new lymphadenopathy or easy bruising Neurological: Longstanding neuropathy in his legs.   Behavioral/Psych: Mood is stable, no new changes  All other systems were reviewed with the patient and are negative.  MEDICAL HISTORY:  Past Medical History:  Diagnosis Date  . Adverse effect of unspecified drugs, medicaments and biological substances, initial encounter   . Essential (primary) hypertension   . Hyperlipidemia, unspecified   . Impotence of organic origin   . Malignant melanoma of skin, unspecified ([place])   . Malignant neoplasm of prostate ([place])   . Multiple myeloma ([place])   . Neuropathy   . Non-pressure chronic ulcer of other part of unspecified foot with unspecified severity ([place])   . Reflux esophagitis   . Restless  legs syndrome   . Tinea unguium   . Type 2 diabetes mellitus with diabetic neuropathy, unspecified ([place])   . Type 2 diabetes mellitus with other specified complication ([place])   . Unspecified atrial fibrillation ([place])     SURGICAL HISTORY: Past Surgical History:  Procedure Laterality Date  . CATARACT EXTRACTION    . HERNIA REPAIR    . melanoma removal    . PROSTATE SURGERY      SOCIAL HISTORY: Social History   Socioeconomic History  . Marital status: Married    Spouse name: Not on file  . Number of children: Not on file  . Years of education: Not on file  . Highest education level: Not on file  Occupational History  .  Not on file  Tobacco Use  . Smoking status: Never Smoker  . Smokeless tobacco: Never Used  Substance and Sexual Activity  . Alcohol use: No    Alcohol/week: 0.0 standard drinks  . Drug use: No  . Sexual activity: Not Currently  Other Topics Concern  . Not on file  Social History Narrative  . Not on file   Social Determinants of Health   Financial Resource Strain: Low Risk   . Difficulty of Paying Living Expenses: Not hard at all  Food Insecurity: No Food Insecurity  . Worried About Charity fundraiser in the Last Year: Never true  . Ran Out of Food in the Last Year: Never true  Transportation Needs: No Transportation Needs  . Lack of Transportation (Medical): No  . Lack of Transportation (Non-Medical): No  Physical Activity: Insufficiently Active  . Days of Exercise per Week: 3 days  . Minutes of Exercise per Session: 20 min  Stress: No Stress Concern Present  . Feeling of Stress : Not at all  Social Connections: Moderately Integrated  . Frequency of Communication with Friends and Family: Twice a week  . Frequency of Social Gatherings with Friends and Family: Twice a week  . Attends Religious Services: 1 to 4 times per year  . Active Member of Clubs or Organizations: No  . Attends Archivist Meetings: Never  . Marital Status: Married   Human resources officer Violence: Not At Risk  . Fear of Current or Ex-Partner: No  . Emotionally Abused: No  . Physically Abused: No  . Sexually Abused: No    FAMILY HISTORY: History reviewed. No pertinent family history.  ALLERGIES:  is allergic to penicillins.  MEDICATIONS:  Current Outpatient Medications  Medication Sig Dispense Refill  . atenolol-chlorthalidone (TENORETIC) 50-25 MG per tablet Take 1 tablet by mouth daily. Takes 1/2 daily per Dr Luan Pulling    . gabapentin (NEURONTIN) 300 MG capsule Take 300 mg by mouth every 4 (four) hours.    Marland Kitchen losartan (COZAAR) 50 MG tablet Take by mouth.    . metFORMIN (GLUCOPHAGE) 500 MG tablet Take 500 mg by mouth 2 (two) times daily with a meal.    . pantoprazole (PROTONIX) 40 MG tablet Take 40 mg by mouth daily.    . diclofenac sodium (VOLTAREN) 1 % GEL Apply 2 g topically daily as needed. (Patient not taking: Reported on 05/13/2020)    . loperamide (IMODIUM A-D) 2 MG tablet Take 2 mg by mouth 4 (four) times daily as needed for diarrhea or loose stools. (Patient not taking: Reported on 05/13/2020)    . traMADol (ULTRAM) 50 MG tablet Take 50 mg by mouth 4 (four) times daily as needed.  (Patient not taking: Reported on 05/13/2020)     No current facility-administered medications for this visit.     PHYSICAL EXAMINATION: ECOG PERFORMANCE STATUS: 0 - Asymptomatic  Vitals:   05/13/20 1127  BP: 139/89  Pulse: 79  Resp: 17  Temp: (!) 97.5 F (36.4 C)  SpO2: 96%   Filed Weights   05/13/20 1127  Weight: 196 lb 9 oz (89.2 kg)    GENERAL:alert, no distress and comfortable SKIN: skin color, texture, turgor are normal, no rashes or significant lesions EYES: normal, conjunctiva are pink and non-injected, sclera clear OROPHARYNX:no exudate, no erythema and lips, buccal mucosa, and tongue normal  NECK: supple, thyroid normal size, non-tender, without nodularity LYMPH:  no palpable lymphadenopathy in the cervical, axillary or inguinal LUNGS: clear  to auscultation and  percussion with normal breathing effort HEART: regular rate & rhythm and no murmurs and no lower extremity edema ABDOMEN:abdomen soft, non-tender and normal bowel sounds Musculoskeletal:no cyanosis of digits and no clubbing  PSYCH: alert & oriented x 3 with fluent speech NEURO: no focal motor/sensory deficits  LABORATORY DATA:  I have reviewed the data as listed Lab Results  Component Value Date   WBC 9.8 05/13/2020   HGB 14.9 05/13/2020   HCT 42.3 05/13/2020   MCV 95.9 05/13/2020   PLT 266 05/13/2020     Chemistry      Component Value Date/Time   NA 137 08/13/2018 0000   K 3.8 08/13/2018 0000   CL 96 (A) 08/13/2018 0000   CO2 25 (A) 08/13/2018 0000   BUN 22 (A) 08/13/2018 0000   CREATININE 1.1 08/13/2018 0000   CREATININE 0.91 06/16/2007 1358   GLU 168 08/13/2018 0000      Component Value Date/Time   CALCIUM 9.8 08/13/2018 0000   ALKPHOS 97 08/13/2018 0000   AST 14 08/13/2018 0000   ALT 13 08/13/2018 0000     Reviewed records from 2011,back then Ig K monoclonal protein of 1.6 gm and K/L ratio of 5. Most recent labs showed a immunoglobulin G levels of 3595, total protein of 8.9, M spike of 2.8 g/dL, kappa lambda ratio of 14.96.   RADIOGRAPHIC STUDIES: I have personally reviewed the radiological images as listed and agreed with the findings in the report. No results found.  All questions were answered. The patient knows to call the clinic with any problems, questions or concerns. I spent 45 minutes in the care of this patient including H and P, review of records, counseling and coordination of care.     Benay Pike, MD 05/13/2020 1:28 PM

## 2020-05-14 LAB — IGG, IGA, IGM
IgA: 124 mg/dL (ref 61–437)
IgG (Immunoglobin G), Serum: 3714 mg/dL — ABNORMAL HIGH (ref 603–1613)
IgM (Immunoglobulin M), Srm: 65 mg/dL (ref 15–143)

## 2020-05-16 LAB — PROTEIN ELECTROPHORESIS, SERUM
A/G Ratio: 0.8 (ref 0.7–1.7)
Albumin ELP: 4 g/dL (ref 2.9–4.4)
Alpha-1-Globulin: 0.2 g/dL (ref 0.0–0.4)
Alpha-2-Globulin: 0.7 g/dL (ref 0.4–1.0)
Beta Globulin: 0.9 g/dL (ref 0.7–1.3)
Gamma Globulin: 3.4 g/dL — ABNORMAL HIGH (ref 0.4–1.8)
Globulin, Total: 5.1 g/dL — ABNORMAL HIGH (ref 2.2–3.9)
M-Spike, %: 3.1 g/dL — ABNORMAL HIGH
Total Protein ELP: 9.1 g/dL — ABNORMAL HIGH (ref 6.0–8.5)

## 2020-05-16 LAB — KAPPA/LAMBDA LIGHT CHAINS
Kappa free light chain: 240.8 mg/L — ABNORMAL HIGH (ref 3.3–19.4)
Kappa, lambda light chain ratio: 17.71 — ABNORMAL HIGH (ref 0.26–1.65)
Lambda free light chains: 13.6 mg/L (ref 5.7–26.3)

## 2020-05-18 ENCOUNTER — Other Ambulatory Visit: Payer: Self-pay

## 2020-05-18 ENCOUNTER — Ambulatory Visit (HOSPITAL_COMMUNITY)
Admission: RE | Admit: 2020-05-18 | Discharge: 2020-05-18 | Disposition: A | Discharge status: Home / Self Care | Payer: Medicare Other | Source: Ambulatory Visit | Attending: Hematology and Oncology | Admitting: Hematology and Oncology

## 2020-05-18 DIAGNOSIS — Z08 Encounter for follow-up examination after completed treatment for malignant neoplasm: Secondary | ICD-10-CM | POA: Diagnosis not present

## 2020-05-18 DIAGNOSIS — D225 Melanocytic nevi of trunk: Secondary | ICD-10-CM | POA: Diagnosis not present

## 2020-05-18 DIAGNOSIS — X32XXXD Exposure to sunlight, subsequent encounter: Secondary | ICD-10-CM | POA: Diagnosis not present

## 2020-05-18 DIAGNOSIS — Z1283 Encounter for screening for malignant neoplasm of skin: Secondary | ICD-10-CM | POA: Diagnosis not present

## 2020-05-18 DIAGNOSIS — D472 Monoclonal gammopathy: Secondary | ICD-10-CM | POA: Diagnosis not present

## 2020-05-18 DIAGNOSIS — C9000 Multiple myeloma not having achieved remission: Secondary | ICD-10-CM | POA: Diagnosis not present

## 2020-05-18 DIAGNOSIS — L570 Actinic keratosis: Secondary | ICD-10-CM | POA: Diagnosis not present

## 2020-05-18 DIAGNOSIS — Z85820 Personal history of malignant melanoma of skin: Secondary | ICD-10-CM | POA: Diagnosis not present

## 2020-05-30 DIAGNOSIS — M792 Neuralgia and neuritis, unspecified: Secondary | ICD-10-CM | POA: Diagnosis not present

## 2020-06-03 ENCOUNTER — Other Ambulatory Visit: Payer: Self-pay | Admitting: Radiology

## 2020-06-03 ENCOUNTER — Ambulatory Visit (HOSPITAL_COMMUNITY): Payer: Medicare Other

## 2020-06-06 ENCOUNTER — Encounter (HOSPITAL_COMMUNITY): Payer: Self-pay

## 2020-06-06 ENCOUNTER — Ambulatory Visit (HOSPITAL_COMMUNITY)
Admission: RE | Admit: 2020-06-06 | Discharge: 2020-06-06 | Disposition: A | Discharge status: Home / Self Care | Payer: Medicare Other | Source: Ambulatory Visit | Attending: Hematology and Oncology | Admitting: Hematology and Oncology

## 2020-06-06 ENCOUNTER — Other Ambulatory Visit: Payer: Self-pay

## 2020-06-06 DIAGNOSIS — D472 Monoclonal gammopathy: Secondary | ICD-10-CM | POA: Diagnosis not present

## 2020-06-06 DIAGNOSIS — Z8546 Personal history of malignant neoplasm of prostate: Secondary | ICD-10-CM | POA: Insufficient documentation

## 2020-06-06 DIAGNOSIS — C9000 Multiple myeloma not having achieved remission: Secondary | ICD-10-CM | POA: Insufficient documentation

## 2020-06-06 DIAGNOSIS — Z85820 Personal history of malignant melanoma of skin: Secondary | ICD-10-CM | POA: Insufficient documentation

## 2020-06-06 LAB — CBC WITH DIFFERENTIAL/PLATELET
Abs Immature Granulocytes: 0.02 10*3/uL (ref 0.00–0.07)
Basophils Absolute: 0 10*3/uL (ref 0.0–0.1)
Basophils Relative: 0 %
Eosinophils Absolute: 0.8 10*3/uL — ABNORMAL HIGH (ref 0.0–0.5)
Eosinophils Relative: 9 %
HCT: 40.5 % (ref 39.0–52.0)
Hemoglobin: 14.4 g/dL (ref 13.0–17.0)
Immature Granulocytes: 0 %
Lymphocytes Relative: 22 %
Lymphs Abs: 2 10*3/uL (ref 0.7–4.0)
MCH: 33.5 pg (ref 26.0–34.0)
MCHC: 35.6 g/dL (ref 30.0–36.0)
MCV: 94.2 fL (ref 80.0–100.0)
Monocytes Absolute: 0.7 10*3/uL (ref 0.1–1.0)
Monocytes Relative: 7 %
Neutro Abs: 5.6 10*3/uL (ref 1.7–7.7)
Neutrophils Relative %: 62 %
Platelets: 262 10*3/uL (ref 150–400)
RBC: 4.3 MIL/uL (ref 4.22–5.81)
RDW: 13.8 % (ref 11.5–15.5)
WBC: 9.1 10*3/uL (ref 4.0–10.5)
nRBC: 0 % (ref 0.0–0.2)

## 2020-06-06 LAB — GLUCOSE, CAPILLARY: Glucose-Capillary: 176 mg/dL — ABNORMAL HIGH (ref 70–99)

## 2020-06-06 MED ORDER — FENTANYL CITRATE (PF) 100 MCG/2ML IJ SOLN
INTRAMUSCULAR | Status: AC | PRN
  Administered 2020-06-06 (×2): 50 ug via INTRAVENOUS

## 2020-06-06 MED ORDER — HYDROCODONE-ACETAMINOPHEN 5-325 MG PO TABS
1.0000 | ORAL_TABLET | ORAL | Status: DC | PRN
Start: 2020-06-06 — End: 2020-06-07

## 2020-06-06 MED ORDER — MIDAZOLAM HCL 2 MG/2ML IJ SOLN
INTRAMUSCULAR | Status: AC | PRN
  Administered 2020-06-06 (×2): 1 mg via INTRAVENOUS

## 2020-06-06 MED ORDER — LIDOCAINE-EPINEPHRINE 1 %-1:100000 IJ SOLN
INTRAMUSCULAR | Status: AC | PRN
  Administered 2020-06-06: 10 mL

## 2020-06-06 MED ORDER — FENTANYL CITRATE (PF) 100 MCG/2ML IJ SOLN
INTRAMUSCULAR | Status: AC
  Filled 2020-06-06: qty 2

## 2020-06-06 MED ORDER — MIDAZOLAM HCL 2 MG/2ML IJ SOLN
INTRAMUSCULAR | Status: AC
  Filled 2020-06-06: qty 4

## 2020-06-06 MED ORDER — SODIUM CHLORIDE 0.9 % IV SOLN: INTRAVENOUS | Status: DC

## 2020-06-06 NOTE — H&P (Signed)
Chief Complaint: Patient was seen in consultation today for bone marrow biopsy at the request of [place]  Referring Physician(s): Iruku,Praveena  Supervising Physician: Arne Cleveland  Patient Status: [place]  History of Present Illness: [name] [name] is a 75 y.o. male with monoclonal gammopathy. He is referred for bone marrow biopsy. States he has had one before several years ago and it was just done in an office. He has requested sedation for procedure and is therefore referred to IR for image guided procedure with sedation PMHx, meds, labs, imaging, allergies reviewed. Feels well, no recent fevers, chills, illness. Has been NPO today as directed.   Past Medical History:  Diagnosis Date  . Adverse effect of unspecified drugs, medicaments and biological substances, initial encounter   . Essential (primary) hypertension   . Hyperlipidemia, unspecified   . Impotence of organic origin   . Malignant melanoma of skin, unspecified ([place])   . Malignant neoplasm of prostate ([place])   . Multiple myeloma ([place])   . Neuropathy   . Non-pressure chronic ulcer of other part of unspecified foot with unspecified severity ([place])   . Reflux esophagitis   . Restless legs syndrome   . Tinea unguium   . Type 2 diabetes mellitus with diabetic neuropathy, unspecified ([place])   . Type 2 diabetes mellitus with other specified complication ([place])   . Unspecified atrial fibrillation [facility])     Past Surgical History:  Procedure Laterality Date  . CATARACT EXTRACTION    . HERNIA REPAIR    . melanoma removal    . PROSTATE SURGERY      Allergies: Penicillins  Medications: Prior to Admission medications   Medication Sig Start Date End Date Taking? Authorizing Provider  atenolol-chlorthalidone (TENORETIC) 50-25 MG per tablet Take 1 tablet by mouth daily. Takes 1/2 daily per Dr Luan Pulling   Yes [provider]  gabapentin (NEURONTIN) 300 MG capsule Take 300 mg by mouth  every 4 (four) hours.   Yes [provider]  losartan (COZAAR) 50 MG tablet Take by mouth. 12/08/18  Yes [provider]  metFORMIN (GLUCOPHAGE) 500 MG tablet Take 500 mg by mouth 2 (two) times daily with a meal.   Yes [provider]  pantoprazole (PROTONIX) 40 MG tablet Take 40 mg by mouth daily.   Yes [provider]  diclofenac sodium (VOLTAREN) 1 % GEL Apply 2 g topically daily as needed. Patient not taking: Reported on 05/13/2020    [provider]  loperamide (IMODIUM A-D) 2 MG tablet Take 2 mg by mouth 4 (four) times daily as needed for diarrhea or loose stools. Patient not taking: Reported on 05/13/2020    [provider]  traMADol (ULTRAM) 50 MG tablet Take 50 mg by mouth 4 (four) times daily as needed.  Patient not taking: Reported on 05/13/2020 12/14/14   [provider]     History reviewed. No pertinent family history.  Social History   Socioeconomic History  . Marital status: Married    Spouse name: Not on file  . Number of children: Not on file  . Years of education: Not on file  . Highest education level: Not on file  Occupational History  . Not on file  Tobacco Use  . Smoking status: Never Smoker  . Smokeless tobacco: Never Used  Substance and Sexual Activity  . Alcohol use: No    Alcohol/week: 0.0 standard drinks  . Drug use: No  . Sexual activity: Not Currently  Other Topics Concern  .  Not on file  Social History Narrative  . Not on file   Social Determinants of Health   Financial Resource Strain: Low Risk   . Difficulty of Paying Living Expenses: Not hard at all  Food Insecurity: No Food Insecurity  . Worried About Charity fundraiser in the Last Year: Never true  . Ran Out of Food in the Last Year: Never true  Transportation Needs: No Transportation Needs  . Lack of Transportation (Medical): No  . Lack of Transportation (Non-Medical): No  Physical Activity: Insufficiently Active  . Days of  Exercise per Week: 3 days  . Minutes of Exercise per Session: 20 min  Stress: No Stress Concern Present  . Feeling of Stress : Not at all  Social Connections: Moderately Integrated  . Frequency of Communication with Friends and Family: Twice a week  . Frequency of Social Gatherings with Friends and Family: Twice a week  . Attends Religious Services: 1 to 4 times per year  . Active Member of Clubs or Organizations: No  . Attends Archivist Meetings: Never  . Marital Status: Married    Review of Systems: A 12 point ROS discussed and pertinent positives are indicated in the HPI above.  All other systems are negative.  Review of Systems  Vital Signs: BP (!) 127/91   Pulse 74   Temp 97.6 F (36.4 C) (Oral)   Resp 18   SpO2 95%   Physical Exam Constitutional:      Appearance: Normal appearance. He is not ill-appearing.  HENT:     Mouth/Throat:     Mouth: Mucous membranes are moist.     Pharynx: Oropharynx is clear.  Cardiovascular:     Rate and Rhythm: Normal rate and regular rhythm.     Heart sounds: Normal heart sounds.  Pulmonary:     Effort: Pulmonary effort is normal. No respiratory distress.     Breath sounds: Normal breath sounds.  Skin:    General: Skin is warm and dry.  Neurological:     General: No focal deficit present.     Mental Status: He is alert and oriented to person, place, and time.  Psychiatric:        Mood and Affect: Mood normal.        Thought Content: Thought content normal.        Judgment: Judgment normal.      Imaging: DG Bone Survey Met  Result Date: 05/20/2020 CLINICAL DATA:  Staging myeloma EXAM: METASTATIC BONE SURVEY COMPARISON:  None. FINDINGS: Heart is normal size.  Lungs are clear.  No effusions. No focal lytic lesion.  No acute bony abnormality. Mild degenerative changes throughout the cervical, thoracic and lumbar spine. Mild levoscoliosis in the lumbar spine. IMPRESSION: No acute bony abnormality or focal lytic lesion.  Electronically Signed   By: Rolm Baptise M.D.   On: 05/20/2020 05:45    Labs:  CBC: Recent Labs    05/13/20 1254  WBC 9.8  HGB 14.9  HCT 42.3  PLT 266    Assessment and Plan: Monoclonal gammopathy For image guided bone marrow biopsy. Risks and benefits of bone marrow biopsy was discussed with the patient and/or patient's family including, but not limited to bleeding, infection, damage to adjacent structures or low yield requiring additional tests.  All of the questions were answered and there is agreement to proceed.  Consent signed and in chart.   Thank you for this interesting consult.  I greatly enjoyed meeting Pierce Crane  Meyerhoff and look forward to participating in their care.  A copy of this report was sent to the requesting provider on this date.  Electronically Signed: Ascencion Dike, PA-C 06/06/2020, 9:39 AM   I spent a total of 20 minutes in face to face in clinical consultation, greater than 50% of which was counseling/coordinating care for bone marrow bx

## 2020-06-06 NOTE — Discharge Instructions (Signed)
Please call Interventional Radiology clinic [phone] with any questions or concerns.  You may remove your dressing and shower tomorrow.   Bone Marrow Aspiration and Bone Marrow Biopsy, Adult, Care After This sheet gives you information about how to care for yourself after your procedure. Your health care provider may also give you more specific instructions. If you have problems or questions, contact your health care provider. What can I expect after the procedure? After the procedure, it is common to have:  Mild pain and tenderness.  Swelling.  Bruising. Follow these instructions at home: Puncture site care  Follow instructions from your health care provider about how to take care of the puncture site. Make sure you: ? Wash your hands with soap and water before and after you change your bandage (dressing). If soap and water are not available, use hand sanitizer. ? Change your dressing as told by your health care provider.  Check your puncture site every day for signs of infection. Check for: ? More redness, swelling, or pain. ? Fluid or blood. ? Warmth. ? Pus or a bad smell.   Activity  Return to your normal activities as told by your health care provider. Ask your health care provider what activities are safe for you.  Do not lift anything that is heavier than 10 lb (4.5 kg), or the limit that you are told, until your health care provider says that it is safe.  Do not drive for 24 hours if you were given a sedative during your procedure. General instructions  Take over-the-counter and prescription medicines only as told by your health care provider.  Do not take baths, swim, or use a hot tub until your health care provider approves. Ask your health care provider if you may take showers. You may only be allowed to take sponge baths.  If directed, put ice on the affected area. To do this: ? Put ice in a plastic bag. ? Place a towel between your skin and the bag. ? Leave  the ice on for 20 minutes, 2-3 times a day.  Keep all follow-up visits as told by your health care provider. This is important.   Contact a health care provider if:  Your pain is not controlled with medicine.  You have a fever.  You have more redness, swelling, or pain around the puncture site.  You have fluid or blood coming from the puncture site.  Your puncture site feels warm to the touch.  You have pus or a bad smell coming from the puncture site. Summary  After the procedure, it is common to have mild pain, tenderness, swelling, and bruising.  Follow instructions from your health care provider about how to take care of the puncture site and what activities are safe for you.  Take over-the-counter and prescription medicines only as told by your health care provider.  Contact a health care provider if you have any signs of infection, such as fluid or blood coming from the puncture site. This information is not intended to replace advice given to you by your health care provider. Make sure you discuss any questions you have with your health care provider. Document Revised: 08/05/2018 Document Reviewed: 08/05/2018 Elsevier Patient Education  2021 Elsevier Inc.   Moderate Conscious Sedation, Adult, Care After This sheet gives you information about how to care for yourself after your procedure. Your health care provider may also give you more specific instructions. If you have problems or questions, contact your health care provider. What   soon. °Follow these instructions at home: °For the time period you were told by your health care provider: °Rest. °Do not participate in activities where you could fall or become injured. °Do not drive or use machinery. °Do not drink alcohol. °Do not take sleeping pills or medicines that cause drowsiness. °Do not  make important decisions or sign legal documents. °Do not take care of children on your own.  °  °  °Eating and drinking °Follow the diet recommended by your health care provider. °Drink enough fluid to keep your urine pale yellow. °If you vomit: °Drink water, juice, or soup when you can drink without vomiting. °Make sure you have little or no nausea before eating solid foods.   °General instructions °Take over-the-counter and prescription medicines only as told by your health care provider. °Have a responsible adult stay with you for the time you are told. It is important to have someone help care for you until you are awake and alert. °Do not smoke. °Keep all follow-up visits as told by your health care provider. This is important. °Contact a health care provider if: °You are still sleepy or having trouble with balance after 24 hours. °You feel light-headed. °You keep feeling nauseous or you keep vomiting. °You develop a rash. °You have a fever. °You have redness or swelling around the IV site. °Get help right away if: °You have trouble breathing. °You have new-onset confusion at home. °Summary °After the procedure, it is common to feel sleepy, have impaired judgment, or feel nauseous if you eat too soon. °Rest after you get home. Know the things you should not do after the procedure. °Follow the diet recommended by your health care provider and drink enough fluid to keep your urine pale yellow. °Get help right away if you have trouble breathing or new-onset confusion at home. °This information is not intended to replace advice given to you by your health care provider. Make sure you discuss any questions you have with your health care provider. °Document Revised: 07/17/2019 Document Reviewed: 02/12/2019 °Elsevier Patient Education © 2021 Elsevier Inc.  °

## 2020-06-06 NOTE — Procedures (Signed)
  Procedure: CT bone marrow bx R iliac EBL:   minimal Complications:  none immediate  See full dictation in [facility].  Dillard Cannon MD Main # [phone] Pager  [phone] Mobile [phone]

## 2020-06-09 DIAGNOSIS — M79676 Pain in unspecified toe(s): Secondary | ICD-10-CM | POA: Diagnosis not present

## 2020-06-09 DIAGNOSIS — B351 Tinea unguium: Secondary | ICD-10-CM | POA: Diagnosis not present

## 2020-06-09 DIAGNOSIS — E1142 Type 2 diabetes mellitus with diabetic polyneuropathy: Secondary | ICD-10-CM | POA: Diagnosis not present

## 2020-06-09 DIAGNOSIS — L84 Corns and callosities: Secondary | ICD-10-CM | POA: Diagnosis not present

## 2020-06-10 ENCOUNTER — Inpatient Hospital Stay (HOSPITAL_COMMUNITY): Payer: Medicare Other | Attending: Hematology and Oncology | Admitting: Hematology and Oncology

## 2020-06-10 ENCOUNTER — Inpatient Hospital Stay (HOSPITAL_COMMUNITY): Payer: Medicare Other

## 2020-06-10 ENCOUNTER — Other Ambulatory Visit: Payer: Self-pay

## 2020-06-10 ENCOUNTER — Encounter (HOSPITAL_COMMUNITY): Payer: Self-pay | Admitting: Hematology and Oncology

## 2020-06-10 VITALS — BP 122/70 | HR 74 | Resp 16 | Wt 193.2 lb

## 2020-06-10 DIAGNOSIS — C9000 Multiple myeloma not having achieved remission: Secondary | ICD-10-CM

## 2020-06-10 DIAGNOSIS — D472 Monoclonal gammopathy: Secondary | ICD-10-CM | POA: Insufficient documentation

## 2020-06-10 DIAGNOSIS — Z7984 Long term (current) use of oral hypoglycemic drugs: Secondary | ICD-10-CM | POA: Insufficient documentation

## 2020-06-10 DIAGNOSIS — E1140 Type 2 diabetes mellitus with diabetic neuropathy, unspecified: Secondary | ICD-10-CM | POA: Insufficient documentation

## 2020-06-10 LAB — COMPREHENSIVE METABOLIC PANEL
ALT: 15 U/L (ref 0–44)
AST: 14 U/L — ABNORMAL LOW (ref 15–41)
Albumin: 3.5 g/dL (ref 3.5–5.0)
Alkaline Phosphatase: 75 U/L (ref 38–126)
Anion gap: 11 (ref 5–15)
BUN: 19 mg/dL (ref 8–23)
CO2: 24 mmol/L (ref 22–32)
Calcium: 8.6 mg/dL — ABNORMAL LOW (ref 8.9–10.3)
Chloride: 100 mmol/L (ref 98–111)
Creatinine, Ser: 1.15 mg/dL (ref 0.61–1.24)
GFR, Estimated: >60 mL/min (ref >60)
Glucose, Bld: 145 mg/dL — ABNORMAL HIGH (ref 70–99)
Potassium: 3.3 mmol/L — ABNORMAL LOW (ref 3.5–5.1)
Sodium: 135 mmol/L (ref 135–145)
Total Bilirubin: 0.9 mg/dL (ref 0.3–1.2)
Total Protein: 9.1 g/dL — ABNORMAL HIGH (ref 6.5–8.1)

## 2020-06-10 NOTE — Progress Notes (Signed)
[facility] CONSULT NOTE  Patient Care Team: Celene Squibb, MD as PCP - General (Internal Medicine) Harl Bowie Alphonse Guild, MD as Consulting Physician (Cardiology)  CHIEF COMPLAINTS/PURPOSE OF CONSULTATION:   MGUS  ASSESSMENT & PLAN:  No problem-specific Assessment & Plan notes found for this encounter.  No orders of the defined types were placed in this encounter.  #1.  IgG kappa MGUS diagnosed back in 2011.   He was lost to follow-up with hematology for a little while and hence reestablished with [place]. Since it has been more than 10 years from the time of diagnosis, I recommended repeating a bone marrow biopsy and bone survey and additional labs.  Bone marrow biopsy showed 20% CD138 positive plasma cells.  Bone survey without any lytic lesions. CBC without any evidence of endorgan damage.  Last creatinine from his primary care physician without any evidence of endorgan damage but CMP from today is pending. 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  He is agreeable with these plans.  He will return to clinic in 3 months he was encouraged to contact us with any new questions or concerns.  HISTORY OF PRESENTING ILLNESS:   [name] [name] 75 y.o. male is here because of MGUS  This is a very pleasant 75 year old male patient with known history of IgG kappa monoclonal gammopathy referred to hematology for additional recommendations.  Patient tells me that he was diagnosed with MGUS back in 2011 and has been following up  with his primary care doctor with yearly labs.  He most recently had to switch doctors since his doctor has retired.  He had repeat labs which showed monoclonal IgG protein measuring about 3.6 g and a kappa lambda ratio of 14 and hence referred back to hematology.  He arrived with his wife to the appointment.  He denies any new complaints.  No new B symptoms, bone pains, change in bowel habits.  He had a bone marrow biopsy back in 2011 or 2012 which showed monoclonal gammopathy without any evidence of smoldering myeloma or multiple myeloma at that time.  He had a skeletal survey also from what he remembers at the same time.  He has not had a bone marrow biopsy since then.  He has longstanding diabetes and neuropathy secondary to diabetes.   Interval History  He is here for a follow-up with his wife after his bone marrow biopsy and bone survey.  He is doing really well.  No new complaints.  No B symptoms.  No new bone pains.  Bone marrow biopsy went well.  Rest of the pertinent 10 point ROS reviewed and negative as mentioned below.  REVIEW OF SYSTEMS:   Constitutional: Denies fevers, chills or abnormal night sweats Eyes: Denies blurriness of vision, double vision or watery eyes Ears, nose, mouth, throat, and face: Denies mucositis or sore throat Respiratory: Denies cough, dyspnea or wheezes Cardiovascular: Denies palpitation, chest discomfort or lower extremity swelling Gastrointestinal:  Denies nausea, heartburn or change in bowel habits Skin: Denies abnormal skin rashes Lymphatics: Denies new lymphadenopathy or easy bruising Neurological: Longstanding  neuropathy in his legs.   Behavioral/Psych: Mood is stable, no new changes  All other systems were reviewed with the patient and are negative.  MEDICAL HISTORY:  Past Medical History:  Diagnosis Date  . Adverse effect of unspecified drugs, medicaments and biological substances, initial encounter   . Essential (primary) hypertension   .  Hyperlipidemia, unspecified   . Impotence of organic origin   . Malignant melanoma of skin, unspecified ([place])   . Malignant neoplasm of prostate ([place])   . Multiple myeloma ([place])   . Neuropathy   . Non-pressure chronic ulcer of other part of unspecified foot with unspecified severity ([place])   . Reflux esophagitis   . Restless legs syndrome   . Tinea unguium   . Type 2 diabetes mellitus with diabetic neuropathy, unspecified ([place])   . Type 2 diabetes mellitus with other specified complication ([place])   . Unspecified atrial fibrillation ([place])     SURGICAL HISTORY: Past Surgical History:  Procedure Laterality Date  . CATARACT EXTRACTION    . HERNIA REPAIR    . melanoma removal    . PROSTATE SURGERY      SOCIAL HISTORY: Social History   Socioeconomic History  . Marital status: Married    Spouse name: Not on file  . Number of children: Not on file  . Years of education: Not on file  . Highest education level: Not on file  Occupational History  . Not on file  Tobacco Use  . Smoking status: Never Smoker  . Smokeless tobacco: Never Used  Substance and Sexual Activity  . Alcohol use: No    Alcohol/week: 0.0 standard drinks  . Drug use: No  . Sexual activity: Not Currently  Other Topics Concern  . Not on file  Social History Narrative  . Not on file   Social Determinants of Health   Financial Resource Strain: Low Risk   . Difficulty of Paying Living Expenses: Not hard at all  Food Insecurity: No Food Insecurity  . Worried About Charity fundraiser in the Last Year: Never true  . Ran Out of Food in the Last Year: Never true  Transportation Needs: No Transportation Needs  . Lack of Transportation (Medical): No  . Lack of Transportation (Non-Medical): No  Physical Activity: Insufficiently Active  . Days of Exercise per Week: 3 days  . Minutes of Exercise per Session: 20 min  Stress: No Stress Concern Present  . Feeling of Stress : Not at all  Social Connections:  Moderately Integrated  . Frequency of Communication with Friends and Family: Twice a week  . Frequency of Social Gatherings with Friends and Family: Twice a week  . Attends Religious Services: 1 to 4 times per year  . Active Member of Clubs or Organizations: No  . Attends Archivist Meetings: Never  . Marital Status: Married  Human resources officer Violence: Not At Risk  . Fear of Current or Ex-Partner: No  . Emotionally Abused: No  . Physically Abused: No  . Sexually Abused: No    FAMILY HISTORY: No family history on file.  ALLERGIES:  is allergic to penicillins.  MEDICATIONS:  Current Outpatient Medications  Medication Sig Dispense Refill  . atenolol-chlorthalidone (TENORETIC) 50-25 MG per tablet Take 1 tablet by mouth daily. Takes 1/2 daily per Dr Luan Pulling    . diclofenac sodium (VOLTAREN) 1 % GEL Apply 2 g topically daily as needed.    . gabapentin (NEURONTIN) 300 MG capsule Take 300 mg by mouth every  4 (four) hours.    Marland Kitchen loperamide (IMODIUM A-D) 2 MG tablet Take 2 mg by mouth 4 (four) times daily as needed for diarrhea or loose stools.    Marland Kitchen losartan (COZAAR) 50 MG tablet Take by mouth.    . metFORMIN (GLUCOPHAGE) 500 MG tablet Take 500 mg by mouth 2 (two) times daily with a meal.    . pantoprazole (PROTONIX) 40 MG tablet Take 40 mg by mouth daily.    . traMADol (ULTRAM) 50 MG tablet Take 50 mg by mouth 4 (four) times daily as needed.     No current facility-administered medications for this visit.     PHYSICAL EXAMINATION: ECOG PERFORMANCE STATUS: 0 - Asymptomatic  Vitals:   06/10/20 1313  BP: 122/70  Pulse: 74  Resp: 16  SpO2: 97%   Filed Weights   06/10/20 1313  Weight: 193 lb 3.2 oz (87.6 kg)    PE deferred in lieu of counseling.  LABORATORY DATA:  I have reviewed the data as listed Lab Results  Component Value Date   WBC 9.1 06/06/2020   HGB 14.4 06/06/2020   HCT 40.5 06/06/2020   MCV 94.2 06/06/2020   PLT 262 06/06/2020     Chemistry       Component Value Date/Time   NA 137 08/13/2018 0000   K 3.8 08/13/2018 0000   CL 96 (A) 08/13/2018 0000   CO2 25 (A) 08/13/2018 0000   BUN 22 (A) 08/13/2018 0000   CREATININE 1.1 08/13/2018 0000   CREATININE 0.91 06/16/2007 1358   GLU 168 08/13/2018 0000      Component Value Date/Time   CALCIUM 9.8 08/13/2018 0000   ALKPHOS 97 08/13/2018 0000   AST 14 08/13/2018 0000   ALT 13 08/13/2018 0000     Reviewed records from 2011,back then Ig K monoclonal protein of 1.6 gm and K/L ratio of 5. Most recent labs showed a immunoglobulin G levels of 3595, total protein of 8.9, M spike of 2.8 g/dL, kappa lambda ratio of 14.96.   We have reviewed bone marrow biopsy results  BONE MARROW, ASPIRATE, CLOT, CORE:  - Plasma cell myeloma.   PERIPHERAL BLOOD:  - Essentially unremarkable.   MICROSCOPIC DESCRIPTION:   PERIPHERAL BLOOD SMEAR: Red blood cell morphology is relatively  unremarkable. Rouleaux formation is not identified. Leukocytes are  present in normal numbers with unremarkable morphology. Circulating  plasma cells are not identified. Platelets are present in normal  numbers.   BONE MARROW ASPIRATE: Spicular and cellular.  Erythroid precursors: Present in essentially normal proportions. No  significant dysplasia.  Granulocytic precursors: Present in essentially normal proportions. No  significant dysplasia. No increase in blasts.  Megakaryocytes: Present and largely unremarkable.  Lymphocytes/plasma cells: Plasma cells are increased in numbers (14% by  manual differential counts) with atypical forms (large forms, prominent  nucleoli). Lymphocytes are not increased.   TOUCH PREPARATIONS: Similar to aspirate smears.   CLOT AND BIOPSY: The core biopsy and clot section are variably cellular  for age (85 to 70%). There is a mild relative increase in erythroid  precursors. Megakaryocytes exhibit a spectrum of maturation without  tight clusters. CD138 immunohistochemistry  highlights increased plasma  cells (20% overall). By light chain in situ hybridization the plasma  cells are kappa restricted.   IRON STAIN: Iron stains are performed on a bone marrow aspirate or touch  imprint smear and section of clot. The controls stained appropriately.     Storage Iron: Present    Ring Sideroblasts: Absent  RADIOGRAPHIC STUDIES: I have personally reviewed the radiological images as listed and agreed with the findings in the report. CT BIOPSY  Result Date: 06/06/2020 CLINICAL DATA:  Monoclonal gammopathy of unknown significance EXAM: CT GUIDED DEEP ILIAC BONE ASPIRATION AND CORE BIOPSY TECHNIQUE: Patient was placed prone on the CT gantry and limited axial scans through the pelvis were obtained. Appropriate skin entry site was identified. Skin site was marked, prepped with chlorhexidine, draped in usual sterile fashion, and infiltrated locally with 1% lidocaine. Intravenous Fentanyl 182mg and Versed 26mwere administered as conscious sedation during continuous monitoring of the patient's level of consciousness and physiological / cardiorespiratory status by the radiology RN, with a total moderate sedation time of 11 minutes. Under CT fluoroscopic guidance an 11-gauge Cook trocar bone needle was advanced into the right iliac bone just lateral to the sacroiliac joint. Once needle tip position was confirmed, core and aspiration samples were obtained, submitted to pathology for approval. Post procedure scans show no hematoma or fracture. Patient tolerated procedure well. COMPLICATIONS: COMPLICATIONS none IMPRESSION: 1. Technically successful CT guided right iliac bone core and aspiration biopsy. Electronically Signed   By: D Lucrezia Europe.D.   On: 06/06/2020 17:23   DG Bone Survey Met  Result Date: 05/20/2020 CLINICAL DATA:  Staging myeloma EXAM: METASTATIC BONE SURVEY COMPARISON:  None. FINDINGS: Heart is normal size.  Lungs are clear.  No effusions. No focal lytic lesion.  No  acute bony abnormality. Mild degenerative changes throughout the cervical, thoracic and lumbar spine. Mild levoscoliosis in the lumbar spine. IMPRESSION: No acute bony abnormality or focal lytic lesion. Electronically Signed   By: KeRolm Baptise.D.   On: 05/20/2020 05:45   CT BONE MARROW BIOPSY & ASPIRATION  Result Date: 06/06/2020 CLINICAL DATA:  Monoclonal gammopathy of unknown significance EXAM: CT GUIDED DEEP ILIAC BONE ASPIRATION AND CORE BIOPSY TECHNIQUE: Patient was placed prone on the CT gantry and limited axial scans through the pelvis were obtained. Appropriate skin entry site was identified. Skin site was marked, prepped with chlorhexidine, draped in usual sterile fashion, and infiltrated locally with 1% lidocaine. Intravenous Fentanyl 10053mand Versed 2mg79mre administered as conscious sedation during continuous monitoring of the patient's level of consciousness and physiological / cardiorespiratory status by the radiology RN, with a total moderate sedation time of 11 minutes. Under CT fluoroscopic guidance an 11-gauge Cook trocar bone needle was advanced into the right iliac bone just lateral to the sacroiliac joint. Once needle tip position was confirmed, core and aspiration samples were obtained, submitted to pathology for approval. Post procedure scans show no hematoma or fracture. Patient tolerated procedure well. COMPLICATIONS: COMPLICATIONS none IMPRESSION: 1. Technically successful CT guided right iliac bone core and aspiration biopsy. Electronically Signed   By: D  HLucrezia Europe.   On: 06/06/2020 17:23    All questions were answered. The patient knows to call the clinic with any problems, questions or concerns. I spent 20 minutes in the care of this patient including H and P, review of records, counseling and coordination of care.     PravBenay Pike 06/10/2020 1:22 PM

## 2020-06-13 ENCOUNTER — Other Ambulatory Visit (HOSPITAL_COMMUNITY)
Admission: RE | Admit: 2020-06-13 | Discharge: 2020-06-13 | Disposition: A | Discharge status: Home / Self Care | Payer: Medicare Other | Source: Ambulatory Visit | Attending: Hematology and Oncology | Admitting: Hematology and Oncology

## 2020-06-13 ENCOUNTER — Other Ambulatory Visit: Payer: Self-pay

## 2020-06-13 DIAGNOSIS — D472 Monoclonal gammopathy: Secondary | ICD-10-CM | POA: Insufficient documentation

## 2020-06-13 LAB — IMMUNOFIXATION ELECTROPHORESIS
IgA: 126 mg/dL (ref 61–437)
IgG (Immunoglobin G), Serum: 3830 mg/dL — ABNORMAL HIGH (ref 603–1613)
IgM (Immunoglobulin M), Srm: 59 mg/dL (ref 15–143)
Total Protein ELP: 9.1 g/dL — ABNORMAL HIGH (ref 6.0–8.5)

## 2020-06-17 ENCOUNTER — Encounter (HOSPITAL_COMMUNITY): Payer: Self-pay | Admitting: Hematology and Oncology

## 2020-06-20 LAB — SURGICAL PATHOLOGY

## 2020-06-21 ENCOUNTER — Other Ambulatory Visit: Payer: Self-pay | Admitting: Hematology and Oncology

## 2020-06-21 NOTE — Progress Notes (Signed)
I called the patient and explained the results.  [name] [name]

## 2020-06-22 LAB — UPEP/TP, 24-HR URINE
Albumin, U: 16.8 %
Alpha 1, Urine: 3.1 %
Alpha 2, Urine: 5.5 %
Beta, Urine: 20.9 %
Gamma Globulin, Urine: 53.7 %
M-Spike, mg/24 hr: 132.6 mg/24 hr — ABNORMAL HIGH
M-spike, %: 46.1 % — ABNORMAL HIGH
Total Protein, Urine-Ur/day: 288 mg/24 hr — ABNORMAL HIGH (ref 30–150)
Total Protein, Urine: 19.5 mg/dL
Total Volume: 1475

## 2020-06-27 DIAGNOSIS — I10 Essential (primary) hypertension: Secondary | ICD-10-CM | POA: Diagnosis not present

## 2020-06-27 DIAGNOSIS — E1140 Type 2 diabetes mellitus with diabetic neuropathy, unspecified: Secondary | ICD-10-CM | POA: Diagnosis not present

## 2020-06-27 DIAGNOSIS — E1169 Type 2 diabetes mellitus with other specified complication: Secondary | ICD-10-CM | POA: Diagnosis not present

## 2020-06-27 DIAGNOSIS — E782 Mixed hyperlipidemia: Secondary | ICD-10-CM | POA: Diagnosis not present

## 2020-06-27 DIAGNOSIS — N182 Chronic kidney disease, stage 2 (mild): Secondary | ICD-10-CM | POA: Diagnosis not present

## 2020-06-30 DIAGNOSIS — D472 Monoclonal gammopathy: Secondary | ICD-10-CM | POA: Diagnosis not present

## 2020-06-30 DIAGNOSIS — I10 Essential (primary) hypertension: Secondary | ICD-10-CM | POA: Diagnosis not present

## 2020-06-30 DIAGNOSIS — E1140 Type 2 diabetes mellitus with diabetic neuropathy, unspecified: Secondary | ICD-10-CM | POA: Diagnosis not present

## 2020-06-30 DIAGNOSIS — C439 Malignant melanoma of skin, unspecified: Secondary | ICD-10-CM | POA: Diagnosis not present

## 2020-06-30 DIAGNOSIS — E782 Mixed hyperlipidemia: Secondary | ICD-10-CM | POA: Diagnosis not present

## 2020-06-30 DIAGNOSIS — M791 Myalgia, unspecified site: Secondary | ICD-10-CM | POA: Diagnosis not present

## 2020-06-30 DIAGNOSIS — K219 Gastro-esophageal reflux disease without esophagitis: Secondary | ICD-10-CM | POA: Diagnosis not present

## 2020-06-30 DIAGNOSIS — H2589 Other age-related cataract: Secondary | ICD-10-CM | POA: Diagnosis not present

## 2020-06-30 DIAGNOSIS — N182 Chronic kidney disease, stage 2 (mild): Secondary | ICD-10-CM | POA: Diagnosis not present

## 2020-08-15 DIAGNOSIS — L570 Actinic keratosis: Secondary | ICD-10-CM | POA: Diagnosis not present

## 2020-08-15 DIAGNOSIS — Z85820 Personal history of malignant melanoma of skin: Secondary | ICD-10-CM | POA: Diagnosis not present

## 2020-08-15 DIAGNOSIS — D225 Melanocytic nevi of trunk: Secondary | ICD-10-CM | POA: Diagnosis not present

## 2020-08-15 DIAGNOSIS — Z08 Encounter for follow-up examination after completed treatment for malignant neoplasm: Secondary | ICD-10-CM | POA: Diagnosis not present

## 2020-08-15 DIAGNOSIS — X32XXXD Exposure to sunlight, subsequent encounter: Secondary | ICD-10-CM | POA: Diagnosis not present

## 2020-08-15 DIAGNOSIS — Z1283 Encounter for screening for malignant neoplasm of skin: Secondary | ICD-10-CM | POA: Diagnosis not present

## 2020-09-08 DIAGNOSIS — M79676 Pain in unspecified toe(s): Secondary | ICD-10-CM | POA: Diagnosis not present

## 2020-09-08 DIAGNOSIS — B351 Tinea unguium: Secondary | ICD-10-CM | POA: Diagnosis not present

## 2020-09-08 DIAGNOSIS — L84 Corns and callosities: Secondary | ICD-10-CM | POA: Diagnosis not present

## 2020-09-08 DIAGNOSIS — E1142 Type 2 diabetes mellitus with diabetic polyneuropathy: Secondary | ICD-10-CM | POA: Diagnosis not present

## 2020-09-13 ENCOUNTER — Ambulatory Visit (HOSPITAL_COMMUNITY): Payer: Medicare Other | Admitting: Physician Assistant

## 2020-09-13 ENCOUNTER — Inpatient Hospital Stay (HOSPITAL_COMMUNITY): Payer: Medicare Other | Attending: Hematology

## 2020-09-13 ENCOUNTER — Other Ambulatory Visit: Payer: Self-pay

## 2020-09-13 ENCOUNTER — Other Ambulatory Visit (HOSPITAL_COMMUNITY): Payer: Self-pay | Admitting: Physician Assistant

## 2020-09-13 DIAGNOSIS — C9000 Multiple myeloma not having achieved remission: Secondary | ICD-10-CM | POA: Diagnosis not present

## 2020-09-13 DIAGNOSIS — D472 Monoclonal gammopathy: Secondary | ICD-10-CM

## 2020-09-13 DIAGNOSIS — E1140 Type 2 diabetes mellitus with diabetic neuropathy, unspecified: Secondary | ICD-10-CM | POA: Insufficient documentation

## 2020-09-13 DIAGNOSIS — Z7984 Long term (current) use of oral hypoglycemic drugs: Secondary | ICD-10-CM | POA: Insufficient documentation

## 2020-09-13 LAB — COMPREHENSIVE METABOLIC PANEL
ALT: 14 U/L (ref 0–44)
AST: 22 U/L (ref 15–41)
Albumin: 3.3 g/dL — ABNORMAL LOW (ref 3.5–5.0)
Alkaline Phosphatase: 76 U/L (ref 38–126)
Anion gap: 11 (ref 5–15)
BUN: 21 mg/dL (ref 8–23)
CO2: 22 mmol/L (ref 22–32)
Calcium: 8.6 mg/dL — ABNORMAL LOW (ref 8.9–10.3)
Chloride: 99 mmol/L (ref 98–111)
Creatinine, Ser: 1.29 mg/dL — ABNORMAL HIGH (ref 0.61–1.24)
GFR, Estimated: 58 mL/min — ABNORMAL LOW (ref >60)
Glucose, Bld: 307 mg/dL — ABNORMAL HIGH (ref 70–99)
Potassium: 4 mmol/L (ref 3.5–5.1)
Sodium: 132 mmol/L — ABNORMAL LOW (ref 135–145)
Total Bilirubin: 1.3 mg/dL — ABNORMAL HIGH (ref 0.3–1.2)
Total Protein: 9.1 g/dL — ABNORMAL HIGH (ref 6.5–8.1)

## 2020-09-13 LAB — LACTATE DEHYDROGENASE: LDH: 182 U/L (ref 98–192)

## 2020-09-14 LAB — PROTEIN ELECTROPHORESIS, SERUM
A/G Ratio: 0.7 (ref 0.7–1.7)
Albumin ELP: 3.9 g/dL (ref 2.9–4.4)
Alpha-1-Globulin: 0.2 g/dL (ref 0.0–0.4)
Alpha-2-Globulin: 0.6 g/dL (ref 0.4–1.0)
Beta Globulin: 0.9 g/dL (ref 0.7–1.3)
Gamma Globulin: 3.7 g/dL — ABNORMAL HIGH (ref 0.4–1.8)
Globulin, Total: 5.3 g/dL — ABNORMAL HIGH (ref 2.2–3.9)
M-Spike, %: 3.3 g/dL — ABNORMAL HIGH
Total Protein ELP: 9.2 g/dL — ABNORMAL HIGH (ref 6.0–8.5)

## 2020-09-14 LAB — IGG, IGA, IGM
IgA: 104 mg/dL (ref 61–437)
IgG (Immunoglobin G), Serum: 3750 mg/dL — ABNORMAL HIGH (ref 603–1613)
IgM (Immunoglobulin M), Srm: 57 mg/dL (ref 15–143)

## 2020-09-14 LAB — KAPPA/LAMBDA LIGHT CHAINS
Kappa free light chain: 286.9 mg/L — ABNORMAL HIGH (ref 3.3–19.4)
Kappa, lambda light chain ratio: 20.06 — ABNORMAL HIGH (ref 0.26–1.65)
Lambda free light chains: 14.3 mg/L (ref 5.7–26.3)

## 2020-09-14 LAB — BETA 2 MICROGLOBULIN, SERUM: Beta-2 Microglobulin: 2.8 mg/L — ABNORMAL HIGH (ref 0.6–2.4)

## 2020-09-19 ENCOUNTER — Other Ambulatory Visit (HOSPITAL_COMMUNITY): Payer: Self-pay | Admitting: Physician Assistant

## 2020-09-19 DIAGNOSIS — D472 Monoclonal gammopathy: Secondary | ICD-10-CM

## 2020-09-19 NOTE — Progress Notes (Signed)
[name] [name], [place] [id number]   CLINIC:  Medical Oncology/Hematology  PCP:  Celene Squibb, MD [place] [address] [phone]   REASON FOR VISIT:  Follow-up for IgG kappa smoldering myeloma  PRIOR THERAPY: None  CURRENT THERAPY: Observation  INTERVAL HISTORY:  [name] [name] 75 y.o. male returns for routine follow-up of IgG kappa smoldering myeloma.  He was last seen in clinic by Dr. Chryl Heck on 06/10/2020.  At today's visit he reports feeling fairly well.  No surgeries, hospitalizations, infections, or changes in his baseline health status since his last visit.  He feels that the neuropathy in his feet is getting worse.  This has been previously attributed to his diabetes.    He has chronic tinnitus for the past several decades; this is chronic and at baseline.  He denies any new bone pain or recent fractures. He denies any B symptoms.    Denies any nausea, vomiting, or diarrhea.  No new neurologic symptoms such as new-onset hearing loss, blurred vision, headache, or dizziness.  No thromboembolic events since his last visit. Has not noticed any recent bleeding such as epistaxis, hematuria, hematemesis, melena, or hematochezia.  Denies recent chest pain on exertion, shortness of breath on minimal exertion, pre-syncopal episodes, or palpitations.  No new masses or lymphadenopathy per his report.   He has 50% energy and 100% appetite. He endorses that he is maintaining a stable weight.    REVIEW OF SYSTEMS:  Review of Systems  Constitutional:  Positive for fatigue (energy 50%). Negative for appetite change, chills, diaphoresis, fever and unexpected weight change.  HENT:   Negative for lump/mass and nosebleeds.   Eyes:  Negative for eye problems.  Respiratory:  Negative for cough, hemoptysis and shortness of breath.   Cardiovascular:  Negative for chest pain, leg swelling and palpitations.  Gastrointestinal:  Negative for  abdominal pain, blood in stool, constipation, diarrhea, nausea and vomiting.  Genitourinary:  Positive for frequency (s/p radical prostatectomy). Negative for hematuria.   Skin: Negative.   Neurological:  Positive for numbness (worsening peripheral neuropathy of bilateral feet). Negative for dizziness, headaches and light-headedness.  Hematological:  Does not bruise/bleed easily.  Psychiatric/Behavioral:  Positive for sleep disturbance.      PAST MEDICAL/SURGICAL HISTORY:  Past Medical History:  Diagnosis Date   Adverse effect of unspecified drugs, medicaments and biological substances, initial encounter    Essential (primary) hypertension    Hyperlipidemia, unspecified    Impotence of organic origin    Malignant melanoma of skin, unspecified ([facility])    Malignant neoplasm of prostate ([facility])    Multiple myeloma ([place] [facility])    Neuropathy    Non-pressure chronic ulcer of other part of unspecified foot with unspecified severity (HCC)    Reflux esophagitis    Restless legs syndrome    Tinea unguium    Type 2 diabetes mellitus with diabetic neuropathy, unspecified ([facility])    Type 2 diabetes mellitus with other specified complication (HCC)    Unspecified atrial fibrillation (HCC)    Past Surgical History:  Procedure Laterality Date   CATARACT EXTRACTION     HERNIA REPAIR     melanoma removal     PROSTATE SURGERY       SOCIAL HISTORY:  Social History   Socioeconomic History   Marital status: Married    Spouse name: Not on file   Number of children: Not on file   Years of education: Not on file  Highest education level: Not on file  Occupational History   Not on file  Tobacco Use   Smoking status: Never   Smokeless tobacco: Never  Substance and Sexual Activity   Alcohol use: No    Alcohol/week: 0.0 standard drinks   Drug use: No   Sexual activity: Not Currently  Other Topics Concern   Not on file  Social History Narrative   Not on file   Social Determinants of Health    Financial Resource Strain: Low Risk    Difficulty of Paying Living Expenses: Not hard at all  Food Insecurity: No Food Insecurity   Worried About Charity fundraiser in the Last Year: Never true   [place] in the Last Year: Never true  Transportation Needs: No Transportation Needs   Lack of Transportation (Medical): No   Lack of Transportation (Non-Medical): No  Physical Activity: Insufficiently Active   Days of Exercise per Week: 3 days   Minutes of Exercise per Session: 20 min  Stress: No Stress Concern Present   Feeling of Stress : Not at all  Social Connections: Moderately Integrated   Frequency of Communication with Friends and Family: Twice a week   Frequency of Social Gatherings with Friends and Family: Twice a week   Attends Religious Services: 1 to 4 times per year   Active Member of [employer] or Organizations: No   Attends Archivist Meetings: Never   Marital Status: Married  Human resources officer Violence: Not At Risk   Fear of Current or Ex-Partner: No   Emotionally Abused: No   Physically Abused: No   Sexually Abused: No    FAMILY HISTORY:  No family history on file.  CURRENT MEDICATIONS:  Outpatient Encounter Medications as of 09/20/2020  Medication Sig Note   atenolol-chlorthalidone (TENORETIC) 50-25 MG per tablet Take 1 tablet by mouth daily. Takes 1/2 daily per Dr Luan Pulling    diclofenac sodium (VOLTAREN) 1 % GEL Apply 2 g topically daily as needed.    gabapentin (NEURONTIN) 300 MG capsule Take 300 mg by mouth every 4 (four) hours.    loperamide (IMODIUM A-D) 2 MG tablet Take 2 mg by mouth 4 (four) times daily as needed for diarrhea or loose stools.    losartan (COZAAR) 50 MG tablet Take by mouth.    metFORMIN (GLUCOPHAGE) 500 MG tablet Take 500 mg by mouth 2 (two) times daily with a meal.    pantoprazole (PROTONIX) 40 MG tablet Take 40 mg by mouth daily.    traMADol (ULTRAM) 50 MG tablet Take 50 mg by mouth 4 (four) times daily as needed. 01/11/2015:  Received from: External Pharmacy   No facility-administered encounter medications on file as of 09/20/2020.    ALLERGIES:  Allergies  Allergen Reactions   Penicillins Rash    Also swelling reaction      PHYSICAL EXAM:  ECOG PERFORMANCE STATUS: 1 - Symptomatic but completely ambulatory  There were no vitals filed for this visit. There were no vitals filed for this visit. Physical Exam Constitutional:      Appearance: Normal appearance.  HENT:     Head: Normocephalic and atraumatic.     Mouth/Throat:     Mouth: Mucous membranes are moist.  Eyes:     Extraocular Movements: Extraocular movements intact.     Pupils: Pupils are equal, round, and reactive to light.  Cardiovascular:     Rate and Rhythm: Normal rate and regular rhythm.     Pulses: Normal pulses.  Heart sounds: Normal heart sounds.  Pulmonary:     Effort: Pulmonary effort is normal.     Breath sounds: Normal breath sounds.  Abdominal:     General: Bowel sounds are normal.     Palpations: Abdomen is soft.     Tenderness: There is no abdominal tenderness.  Musculoskeletal:        General: No swelling.     Right lower leg: No edema.     Left lower leg: No edema.  Lymphadenopathy:     Cervical: No cervical adenopathy.  Skin:    General: Skin is warm and dry.  Neurological:     General: No focal deficit present.     Mental Status: He is alert and oriented to person, place, and time.  Psychiatric:        Mood and Affect: Mood normal.        Behavior: Behavior normal.     LABORATORY DATA:  I have reviewed the labs as listed.  CBC    Component Value Date/Time   WBC 9.1 06/06/2020 0925   RBC 4.30 06/06/2020 0925   HGB 14.4 06/06/2020 0925   HCT 40.5 06/06/2020 0925   PLT 262 06/06/2020 0925   MCV 94.2 06/06/2020 0925   MCH 33.5 06/06/2020 0925   MCHC 35.6 06/06/2020 0925   RDW 13.8 06/06/2020 0925   LYMPHSABS 2.0 06/06/2020 0925   MONOABS 0.7 06/06/2020 0925   EOSABS 0.8 (H) 06/06/2020 0925    BASOSABS 0.0 06/06/2020 0925   CMP Latest Ref Rng & Units 09/13/2020 06/10/2020 08/13/2018  Glucose 70 - 99 mg/dL 307(H) 145(H) -  BUN 8 - 23 mg/dL 21 19 22(A)  Creatinine 0.61 - 1.24 mg/dL 1.29(H) 1.15 1.1  Sodium 135 - 145 mmol/L 132(L) 135 137  Potassium 3.5 - 5.1 mmol/L 4.0 3.3(L) 3.8  Chloride 98 - 111 mmol/L 99 100 96(A)  CO2 22 - 32 mmol/L 22 24 25(A)  Calcium 8.9 - 10.3 mg/dL 8.6(L) 8.6(L) 9.8  Total Protein 6.5 - 8.1 g/dL 9.1(H) 9.1(H) -  Total Bilirubin 0.3 - 1.2 mg/dL 1.3(H) 0.9 -  Alkaline Phos 38 - 126 U/L 76 75 97  AST 15 - 41 U/L 22 14(L) 14  ALT 0 - 44 U/L _0 DIAGNOSTIC IMAGING:  I have independently reviewed the relevant imaging and discussed with the patient.  ASSESSMENT: 1.  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  History of melanoma - He had melanoma removed from his lip in 2020.  He did not require further treatment.  3.  History of prostate  cancer - Patient had radical prostatectomy in 2006.  No chemo or  radiation.  4.  Family history of cancer - Patient's mother deceased at age 42 with multiple myeloma - Son deceased at age 21 with a LL - Patient's father had "age-related" leukemia, unspecified type - Brother with prostate cancer   PLAN:  1.  Intermediate-high risk smoldering myeloma, IgG kappa - Per recommendations from the [place] of Hematology 2021, patient may benefit from therapy with lenalidomide alone or in conjugation with dexamethasone, based on the following: IMWG Score is 10 = Intermediate risk category - (51% risk of progression over 2 years, 70% risk of progression over 5 years)  High risk per [facility] 20/20/2 Score = 3 (plasma cell > 20%, M protein > 2, FLC ratio greater than 20) - 47% risk of progression over 2 years, 82% risk of progression over 5 years Even with slow increase in M-protein over time, patient would benefit from preventative treatment due to his high risk of progression to symptomatic multiple myeloma -The above plan and recommendations were discussed extensively with the patient by myself and supervising physician Dr. Lorenso Courier - Patient and his wife would like to think about this before deciding - Follow-up in 1 week with phone visit to discuss treatment plan - If patient and his wife are agreeable to treatment, we will start patient on 25 mg Revlimid (3 weeks on/1 week off) with dexamethasone, as well as aspirin due to increased risk of thromboembolism while on Revlimid  PLAN SUMMARY & DISPOSITION: -Phone visit in 1 week to discuss treatment options  All questions were answered. The patient knows to call the clinic with any problems, questions or concerns.  Medical decision making: Moderate  Time spent on visit: I spent 40 minutes counseling the patient face to face. The total time spent in the appointment was 55 minutes and more than 50% was on counseling.   Harriett Rush, PA-C  09/20/20 5:17 PM

## 2020-09-20 ENCOUNTER — Other Ambulatory Visit (HOSPITAL_COMMUNITY): Payer: Self-pay

## 2020-09-20 ENCOUNTER — Inpatient Hospital Stay (HOSPITAL_COMMUNITY): Payer: Medicare Other | Admitting: Physician Assistant

## 2020-09-20 ENCOUNTER — Telehealth (HOSPITAL_COMMUNITY): Payer: Self-pay | Admitting: Pharmacy Technician

## 2020-09-20 ENCOUNTER — Other Ambulatory Visit: Payer: Self-pay

## 2020-09-20 ENCOUNTER — Inpatient Hospital Stay (HOSPITAL_COMMUNITY): Payer: Medicare Other

## 2020-09-20 VITALS — BP 156/76 | HR 86 | Temp 96.9°F | Resp 18 | Wt 191.5 lb

## 2020-09-20 DIAGNOSIS — C9000 Multiple myeloma not having achieved remission: Secondary | ICD-10-CM

## 2020-09-20 DIAGNOSIS — D472 Monoclonal gammopathy: Secondary | ICD-10-CM

## 2020-09-20 DIAGNOSIS — E1140 Type 2 diabetes mellitus with diabetic neuropathy, unspecified: Secondary | ICD-10-CM | POA: Diagnosis not present

## 2020-09-20 DIAGNOSIS — Z7984 Long term (current) use of oral hypoglycemic drugs: Secondary | ICD-10-CM | POA: Diagnosis not present

## 2020-09-20 LAB — CBC WITH DIFFERENTIAL/PLATELET
Abs Immature Granulocytes: 0.03 10*3/uL (ref 0.00–0.07)
Basophils Absolute: 0.1 10*3/uL (ref 0.0–0.1)
Basophils Relative: 1 %
Eosinophils Absolute: 1.1 10*3/uL — ABNORMAL HIGH (ref 0.0–0.5)
Eosinophils Relative: 13 %
HCT: 37.9 % — ABNORMAL LOW (ref 39.0–52.0)
Hemoglobin: 13.1 g/dL (ref 13.0–17.0)
Immature Granulocytes: 0 %
Lymphocytes Relative: 19 %
Lymphs Abs: 1.6 10*3/uL (ref 0.7–4.0)
MCH: 34.3 pg — ABNORMAL HIGH (ref 26.0–34.0)
MCHC: 34.6 g/dL (ref 30.0–36.0)
MCV: 99.2 fL (ref 80.0–100.0)
Monocytes Absolute: 0.6 10*3/uL (ref 0.1–1.0)
Monocytes Relative: 7 %
Neutro Abs: 5.3 10*3/uL (ref 1.7–7.7)
Neutrophils Relative %: 60 %
Platelets: 271 10*3/uL (ref 150–400)
RBC: 3.82 MIL/uL — ABNORMAL LOW (ref 4.22–5.81)
RDW: 14.6 % (ref 11.5–15.5)
WBC: 8.7 10*3/uL (ref 4.0–10.5)
nRBC: 0 % (ref 0.0–0.2)

## 2020-09-20 NOTE — Telephone Encounter (Signed)
Oral Oncology Patient Advocate Encounter   Received notification from [employer] that prior authorization for Revlimid is required.   PA submitted on CoverMyMeds Key [id number] Status is pending   Oral Oncology Clinic will continue to follow.  [place] Patient [place] Phone [phone] Fax [phone] 09/20/2020 2:44 PM

## 2020-09-20 NOTE — Telephone Encounter (Signed)
Oral Oncology Patient Advocate Encounter  Prior Authorization for Revlimid has been approved.    PA# [id number] Effective dates: 09/20/20 through 04/01/21  Patients co-pay is $3170.30.  Oral Oncology Clinic will continue to follow.   [place] Patient [place] Phone [phone] Fax [phone] 09/20/2020 2:45 PM

## 2020-09-20 NOTE — Patient Instructions (Signed)
[place] at [facility] Main Discharge Instructions  You were seen today by Tarri Abernethy PA-C and Dr. Lorenso Courier for your smoldering myeloma.  Your smoldering myeloma is considered "high risk" for progression to multiple myeloma (about a 40% risk of progression in the next 2 years without treatment). Due to this high risk, you can receive treatment early to get this condition under control before it progresses.  We recommend starting this treatment, but understand that you need time to think about this.  We will schedule a phone visit next week to discuss this further.  MEDICATIONS: Due to the risk of progression from your condition (Smoldering Myeloma) to full-blown cancer (Multiple Myeloma), we would like you to consider starting chemo pills to prevent progression to cancer.  If you decide to take this treatment, we would prescribe Revlimid (lenalidomide) 25 mg daily (3 weeks on / 1 weeks off) and dexamethasone (steroid).    Thank you for choosing [place] at [facility] to provide your oncology and hematology care.  To afford each patient quality time with our provider, please arrive at least 15 minutes before your scheduled appointment time.   If you have a lab appointment with the [facility] please come in thru the Main Entrance and check in at the main information desk.  You need to re-schedule your appointment should you arrive 10 or more minutes late.  We strive to give you quality time with our providers, and arriving late affects you and other patients whose appointments are after yours.  Also, if you no show three or more times for appointments you may be dismissed from the clinic at the providers discretion.     Again, thank you for choosing [facility].  Our hope is that these requests will decrease the amount of time that you wait before being seen by our physicians.        _____________________________________________________________  Should you have questions after your visit to [facility], please contact our office at [phone] and follow the prompts.  Our office hours are 8:00 a.m. and 4:30 p.m. Monday - Friday.  Please note that voicemails left after 4:00 p.m. may not be returned until the following business day.  We are closed weekends and major holidays.  You do have access to a nurse 24-7, just call the main number to the clinic [phone] and do not press any options, hold on the line and a nurse will answer the phone.    For prescription refill requests, have your pharmacy contact our office and allow 72 hours.    Due to Covid, you will need to wear a mask upon entering the hospital. If you do not have a mask, a mask will be given to you at the Main Entrance upon arrival. For doctor visits, patients may have 1 support person age 58 or older with them. For treatment visits, patients can not have anyone with them due to social distancing guidelines and our immunocompromised population.

## 2020-09-21 NOTE — Telephone Encounter (Signed)
Erroneous encounter

## 2020-09-27 ENCOUNTER — Telehealth (HOSPITAL_COMMUNITY): Payer: Medicare Other | Admitting: Physician Assistant

## 2020-09-29 NOTE — Telephone Encounter (Signed)
Patient not sure if he wants to start therapy.  Will speak with Dr Chryl Heck and then will make a decision.  [place] Patient [name] [name] Phone [phone] Fax [phone] 09/29/2020 9:42 AM'

## 2020-10-07 ENCOUNTER — Other Ambulatory Visit: Payer: Self-pay

## 2020-10-07 ENCOUNTER — Inpatient Hospital Stay (HOSPITAL_COMMUNITY): Payer: Medicare Other | Attending: Hematology | Admitting: Hematology and Oncology

## 2020-10-07 ENCOUNTER — Encounter (HOSPITAL_COMMUNITY): Payer: Self-pay | Admitting: Hematology and Oncology

## 2020-10-07 DIAGNOSIS — C9000 Multiple myeloma not having achieved remission: Secondary | ICD-10-CM

## 2020-10-07 DIAGNOSIS — D472 Monoclonal gammopathy: Secondary | ICD-10-CM | POA: Insufficient documentation

## 2020-10-07 DIAGNOSIS — I10 Essential (primary) hypertension: Secondary | ICD-10-CM | POA: Diagnosis not present

## 2020-10-07 DIAGNOSIS — E118 Type 2 diabetes mellitus with unspecified complications: Secondary | ICD-10-CM | POA: Diagnosis not present

## 2020-10-07 NOTE — Progress Notes (Signed)
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  IgG kappa MGUS diagnosed back in 2011.   He was lost to follow-up with hematology for a little while and hence reestablished with [place]. Since it has been more than 10 years from the time of diagnosis, I recommended repeating a bone marrow biopsy and bone survey and additional labs.  Bone marrow biopsy showed 20% CD138 positive plasma cells.  Bone survey without any lytic lesions. CBC without any evidence of endorgan damage.  Last creatinine from his primary care physician without any evidence of endorgan damage but CMP from today is pending. SPEP consistent with monoclonal protein measuring 3.1 g/dL, kappa lambda ratio at 17.71  Given no evidence of clear endorgan damage, this would be better defined as smoldering multiple myeloma We have discussed about the study from [facility] suggesting using Revlimid and dexamethasone in some patients with smoldering multiple myeloma but given his relatively slow progression, I believe it is reasonable to continue monitoring him every 3 months and consider treatment when he turns into overt myeloma.  The main advantage I see with revelimid in SMM is delayed progression to MM and also can prevent damage to organs that can occur in MM. I did strongly encouraged him to try the revelimid or revelimid with dex. He again is very worried about it, his son died of chemo complications, his mom died of some cancer. He wants to think about it for another 4 weeks, repeat labs and then come up with his final decision.  No orders of the defined types were placed in this encounter.  HISTORY OF PRESENTING ILLNESS:   [name] [name] 75 y.o. male is here because of MGUS  This is a  very pleasant 75 year old male patient with known history of IgG kappa smoldering MM here for FU  Interval History He is here for FU. Doing well, no complaints today. He is very anxious about trying revelimid He says his mom died of complications from medicines, he lost his son to chemo complications. He would like to think about it for another few weeks, also [facility] [place] because this can help him with the cost. Rest of the pertinent 10 point ROS reviewed and neg.  MEDICAL HISTORY:  Past Medical History:  Diagnosis Date   Adverse effect of unspecified drugs, medicaments and biological substances, initial encounter    Essential (primary) hypertension    Hyperlipidemia, unspecified    Impotence of organic origin    Malignant melanoma of skin, unspecified ([place])    Malignant neoplasm of prostate ([place])    Multiple myeloma ([place])    Neuropathy    Non-pressure chronic ulcer of other part of unspecified foot with unspecified severity (HCC)    Reflux esophagitis    Restless legs syndrome    Tinea unguium    Type 2 diabetes mellitus with diabetic neuropathy, unspecified ([place])    Type 2 diabetes mellitus with other specified complication (HCC)    Unspecified atrial fibrillation ([place])     SURGICAL HISTORY: Past Surgical History:  Procedure Laterality Date   CATARACT EXTRACTION     HERNIA REPAIR     melanoma removal     PROSTATE SURGERY      SOCIAL HISTORY: Social History  Socioeconomic History   Marital status: Married    Spouse name: Not on file   Number of children: Not on file   Years of education: Not on file   Highest education level: Not on file  Occupational History   Not on file  Tobacco Use   Smoking status: Never   Smokeless tobacco: Never  Substance and Sexual Activity   Alcohol use: No    Alcohol/week: 0.0 standard drinks   Drug use: No   Sexual activity: Not Currently  Other Topics Concern   Not on file  Social History  Narrative   Not on file   Social Determinants of Health   Financial Resource Strain: Low Risk    Difficulty of Paying Living Expenses: Not hard at all  Food Insecurity: No Food Insecurity   Worried About Charity fundraiser in the Last Year: Never true   [place] in the Last Year: Never true  Transportation Needs: No Transportation Needs   Lack of Transportation (Medical): No   Lack of Transportation (Non-Medical): No  Physical Activity: Insufficiently Active   Days of Exercise per Week: 3 days   Minutes of Exercise per Session: 20 min  Stress: No Stress Concern Present   Feeling of Stress : Not at all  Social Connections: Moderately Integrated   Frequency of Communication with Friends and Family: Twice a week   Frequency of Social Gatherings with Friends and Family: Twice a week   Attends Religious Services: 1 to 4 times per year   Active Member of [employer] or Organizations: No   Attends Archivist Meetings: Never   Marital Status: Married  Human resources officer Violence: Not At Risk   Fear of Current or Ex-Partner: No   Emotionally Abused: No   Physically Abused: No   Sexually Abused: No    FAMILY HISTORY: History reviewed. No pertinent family history.  ALLERGIES:  is allergic to penicillins.  MEDICATIONS:  Current Outpatient Medications  Medication Sig Dispense Refill   atenolol-chlorthalidone (TENORETIC) 50-25 MG per tablet Take 1 tablet by mouth daily. Takes 1/2 daily per Dr Luan Pulling     gabapentin (NEURONTIN) 300 MG capsule Take 300 mg by mouth every 4 (four) hours.     losartan (COZAAR) 100 MG tablet Take 1 tablet by mouth daily.     metFORMIN (GLUCOPHAGE) 500 MG tablet Take 500 mg by mouth 2 (two) times daily with a meal.     pantoprazole (PROTONIX) 40 MG tablet Take 40 mg by mouth daily.     diclofenac sodium (VOLTAREN) 1 % GEL Apply 2 g topically daily as needed. (Patient not taking: Reported on 10/07/2020)     loperamide (IMODIUM A-D) 2 MG tablet Take 2  mg by mouth 4 (four) times daily as needed for diarrhea or loose stools. (Patient not taking: Reported on 10/07/2020)     No current facility-administered medications for this visit.     PHYSICAL EXAMINATION: ECOG PERFORMANCE STATUS: 0 - Asymptomatic  Vitals:   10/07/20 0848  BP: (!) 152/99  Pulse: 87  Resp: 16  Temp: 97.9 F (36.6 C)  SpO2: 93%   Filed Weights   10/07/20 0848  Weight: 190 lb 3.2 oz (86.3 kg)    PE deferred in lieu of counseling.  LABORATORY DATA:  I have reviewed the data as listed Lab Results  Component Value Date   WBC 8.7 09/20/2020   HGB 13.1 09/20/2020   HCT 37.9 (L) 09/20/2020   MCV 99.2 09/20/2020  PLT 271 09/20/2020     Chemistry      Component Value Date/Time   NA 132 (L) 09/13/2020 1124   NA 137 08/13/2018 0000   K 4.0 09/13/2020 1124   CL 99 09/13/2020 1124   CO2 22 09/13/2020 1124   BUN 21 09/13/2020 1124   BUN 22 (A) 08/13/2018 0000   CREATININE 1.29 (H) 09/13/2020 1124   GLU 168 08/13/2018 0000      Component Value Date/Time   CALCIUM 8.6 (L) 09/13/2020 1124   ALKPHOS 76 09/13/2020 1124   AST 22 09/13/2020 1124   ALT 14 09/13/2020 1124   BILITOT 1.3 (H) 09/13/2020 1124     Reviewed records from 2011,back then Ig K monoclonal protein of 1.6 gm and K/L ratio of 5. Most recent labs showed a immunoglobulin G levels of 3595, total protein of 8.9, M spike of 2.8 g/dL, kappa lambda ratio of 14.96.   We have reviewed bone marrow biopsy results  BONE MARROW, ASPIRATE, CLOT, CORE:  - Plasma cell myeloma.   PERIPHERAL BLOOD:  - Essentially unremarkable.   MICROSCOPIC DESCRIPTION:   PERIPHERAL BLOOD SMEAR: Red blood cell morphology is relatively  unremarkable.  Rouleaux formation is not identified.  Leukocytes are  present in normal numbers with unremarkable morphology.  Circulating  plasma cells are not identified.  Platelets are present in normal  numbers.   BONE MARROW ASPIRATE: Spicular and cellular.  Erythroid  precursors: Present in essentially normal proportions.  No  significant dysplasia.  Granulocytic precursors: Present in essentially normal proportions.  No  significant dysplasia.  No increase in blasts.  Megakaryocytes: Present and largely unremarkable.  Lymphocytes/plasma cells: Plasma cells are increased in numbers (14% by  manual differential counts) with atypical forms (large forms, prominent  nucleoli).  Lymphocytes are not increased.   TOUCH PREPARATIONS: Similar to aspirate smears.   CLOT AND BIOPSY: The core biopsy and clot section are variably cellular  for age (80 to 70%).  There is a mild relative increase in erythroid  precursors.  Megakaryocytes exhibit a spectrum of maturation without  tight clusters.  CD138 immunohistochemistry highlights increased plasma  cells (20% overall).  By light chain in situ hybridization the plasma  cells are kappa restricted.   IRON STAIN: Iron stains are performed on a bone marrow aspirate or touch  imprint smear and section of clot. The controls stained appropriately.        Storage Iron: Present       Ring Sideroblasts: Absent   RADIOGRAPHIC STUDIES: I have personally reviewed the radiological images as listed and agreed with the findings in the report. No results found.   All questions were answered. The patient knows to call the clinic with any problems, questions or concerns. I spent 30 minutes in the care of this patient including H and P, review of records, counseling and coordination of care. We have again discussed about using revelimid vs revelmid plus dex in SMM to prevent end organ damage.   We administer lenalidomide 25 mg on the first 21 days of each 28-day cycle, and continue as tolerated for up to two years  We dont know if there is OS benefit.   Benay Pike, MD 10/07/2020 11:57 AM

## 2020-10-07 NOTE — Assessment & Plan Note (Signed)
#  1.  IgG kappa MGUS diagnosed back in 2011.   He was lost to follow-up with hematology for a little while and hence reestablished with [place]. Since it has been more than 10 years from the time of diagnosis, I recommended repeating a bone marrow biopsy and bone survey and additional labs.  Bone marrow biopsy showed 20% CD138 positive plasma cells.  Bone survey without any lytic lesions. CBC without any evidence of endorgan damage.  Last creatinine from his primary care physician without any evidence of endorgan damage but CMP from today is pending. SPEP consistent with monoclonal protein measuring 3.1 g/dL, kappa lambda ratio at 17.71  Given no evidence of clear endorgan damage, this would be better defined as smoldering multiple myeloma We have discussed about the study from [facility] suggesting using Revlimid and dexamethasone in some patients with smoldering multiple myeloma but given his relatively slow progression, I believe it is reasonable to continue monitoring him every 3 months and consider treatment when he turns into overt myeloma.  The main advantage I see with revelimid in SMM is delayed progression to MM and also can prevent damage to organs that can occur in MM. I did strongly encouraged him to try the revelimid or revelimid with dex. He again is very worried about it, his son died of chemo complications, his mom died of some cancer. He wants to think about it for another 4 weeks, repeat labs and then come up with his final decision.

## 2020-10-11 DIAGNOSIS — E782 Mixed hyperlipidemia: Secondary | ICD-10-CM | POA: Diagnosis not present

## 2020-10-11 DIAGNOSIS — G3184 Mild cognitive impairment, so stated: Secondary | ICD-10-CM | POA: Diagnosis not present

## 2020-10-11 DIAGNOSIS — C439 Malignant melanoma of skin, unspecified: Secondary | ICD-10-CM | POA: Diagnosis not present

## 2020-10-11 DIAGNOSIS — G720 Drug-induced myopathy: Secondary | ICD-10-CM | POA: Diagnosis not present

## 2020-10-11 DIAGNOSIS — N182 Chronic kidney disease, stage 2 (mild): Secondary | ICD-10-CM | POA: Diagnosis not present

## 2020-10-11 DIAGNOSIS — C9000 Multiple myeloma not having achieved remission: Secondary | ICD-10-CM | POA: Diagnosis not present

## 2020-10-11 DIAGNOSIS — I10 Essential (primary) hypertension: Secondary | ICD-10-CM | POA: Diagnosis not present

## 2020-10-11 DIAGNOSIS — E1140 Type 2 diabetes mellitus with diabetic neuropathy, unspecified: Secondary | ICD-10-CM | POA: Diagnosis not present

## 2020-10-11 DIAGNOSIS — K219 Gastro-esophageal reflux disease without esophagitis: Secondary | ICD-10-CM | POA: Diagnosis not present

## 2020-10-11 DIAGNOSIS — M791 Myalgia, unspecified site: Secondary | ICD-10-CM | POA: Diagnosis not present

## 2020-10-31 ENCOUNTER — Inpatient Hospital Stay (HOSPITAL_COMMUNITY): Payer: Medicare Other | Attending: Hematology

## 2020-10-31 ENCOUNTER — Inpatient Hospital Stay (HOSPITAL_COMMUNITY): Payer: Medicare Other

## 2020-10-31 ENCOUNTER — Other Ambulatory Visit: Payer: Self-pay

## 2020-10-31 DIAGNOSIS — Z7984 Long term (current) use of oral hypoglycemic drugs: Secondary | ICD-10-CM | POA: Insufficient documentation

## 2020-10-31 DIAGNOSIS — D472 Monoclonal gammopathy: Secondary | ICD-10-CM

## 2020-10-31 DIAGNOSIS — Z79899 Other long term (current) drug therapy: Secondary | ICD-10-CM | POA: Insufficient documentation

## 2020-10-31 DIAGNOSIS — C9000 Multiple myeloma not having achieved remission: Secondary | ICD-10-CM | POA: Insufficient documentation

## 2020-10-31 DIAGNOSIS — E1140 Type 2 diabetes mellitus with diabetic neuropathy, unspecified: Secondary | ICD-10-CM | POA: Insufficient documentation

## 2020-10-31 LAB — CBC WITH DIFFERENTIAL/PLATELET
Abs Immature Granulocytes: 0.06 10*3/uL (ref 0.00–0.07)
Basophils Absolute: 0.1 10*3/uL (ref 0.0–0.1)
Basophils Relative: 1 %
Eosinophils Absolute: 0.6 10*3/uL — ABNORMAL HIGH (ref 0.0–0.5)
Eosinophils Relative: 7 %
HCT: 40.5 % (ref 39.0–52.0)
Hemoglobin: 14.5 g/dL (ref 13.0–17.0)
Immature Granulocytes: 1 %
Lymphocytes Relative: 21 %
Lymphs Abs: 1.9 10*3/uL (ref 0.7–4.0)
MCH: 35.1 pg — ABNORMAL HIGH (ref 26.0–34.0)
MCHC: 35.8 g/dL (ref 30.0–36.0)
MCV: 98.1 fL (ref 80.0–100.0)
Monocytes Absolute: 0.7 10*3/uL (ref 0.1–1.0)
Monocytes Relative: 8 %
Neutro Abs: 5.6 10*3/uL (ref 1.7–7.7)
Neutrophils Relative %: 62 %
Platelets: 243 10*3/uL (ref 150–400)
RBC: 4.13 MIL/uL — ABNORMAL LOW (ref 4.22–5.81)
RDW: 14.3 % (ref 11.5–15.5)
WBC: 8.8 10*3/uL (ref 4.0–10.5)
nRBC: 0 % (ref 0.0–0.2)

## 2020-10-31 LAB — COMPREHENSIVE METABOLIC PANEL
ALT: 13 U/L (ref 0–44)
AST: 14 U/L — ABNORMAL LOW (ref 15–41)
Albumin: 3.5 g/dL (ref 3.5–5.0)
Alkaline Phosphatase: 77 U/L (ref 38–126)
Anion gap: 8 (ref 5–15)
BUN: 25 mg/dL — ABNORMAL HIGH (ref 8–23)
CO2: 27 mmol/L (ref 22–32)
Calcium: 8.8 mg/dL — ABNORMAL LOW (ref 8.9–10.3)
Chloride: 98 mmol/L (ref 98–111)
Creatinine, Ser: 1.29 mg/dL — ABNORMAL HIGH (ref 0.61–1.24)
GFR, Estimated: 57 mL/min — ABNORMAL LOW (ref >60)
Glucose, Bld: 171 mg/dL — ABNORMAL HIGH (ref 70–99)
Potassium: 3.4 mmol/L — ABNORMAL LOW (ref 3.5–5.1)
Sodium: 133 mmol/L — ABNORMAL LOW (ref 135–145)
Total Bilirubin: 1 mg/dL (ref 0.3–1.2)
Total Protein: 10 g/dL — ABNORMAL HIGH (ref 6.5–8.1)

## 2020-11-01 LAB — IGG, IGA, IGM
IgA: 97 mg/dL (ref 61–437)
IgG (Immunoglobin G), Serum: 4397 mg/dL — ABNORMAL HIGH (ref 603–1613)
IgM (Immunoglobulin M), Srm: 61 mg/dL (ref 15–143)

## 2020-11-01 LAB — BETA 2 MICROGLOBULIN, SERUM: Beta-2 Microglobulin: 3.2 mg/L — ABNORMAL HIGH (ref 0.6–2.4)

## 2020-11-01 LAB — KAPPA/LAMBDA LIGHT CHAINS
Kappa free light chain: 347.1 mg/L — ABNORMAL HIGH (ref 3.3–19.4)
Kappa, lambda light chain ratio: 26.7 — ABNORMAL HIGH (ref 0.26–1.65)
Lambda free light chains: 13 mg/L (ref 5.7–26.3)

## 2020-11-02 LAB — PROTEIN ELECTROPHORESIS, SERUM
A/G Ratio: 0.7 (ref 0.7–1.7)
Albumin ELP: 3.8 g/dL (ref 2.9–4.4)
Alpha-1-Globulin: 0.2 g/dL (ref 0.0–0.4)
Alpha-2-Globulin: 0.6 g/dL (ref 0.4–1.0)
Beta Globulin: 0.9 g/dL (ref 0.7–1.3)
Gamma Globulin: 3.7 g/dL — ABNORMAL HIGH (ref 0.4–1.8)
Globulin, Total: 5.3 g/dL — ABNORMAL HIGH (ref 2.2–3.9)
M-Spike, %: 3.4 g/dL — ABNORMAL HIGH
Total Protein ELP: 9.1 g/dL — ABNORMAL HIGH (ref 6.0–8.5)

## 2020-11-07 ENCOUNTER — Inpatient Hospital Stay (HOSPITAL_COMMUNITY): Payer: Medicare Other | Admitting: Hematology

## 2020-11-07 ENCOUNTER — Other Ambulatory Visit: Payer: Self-pay

## 2020-11-07 ENCOUNTER — Ambulatory Visit (HOSPITAL_COMMUNITY): Payer: Medicare Other | Admitting: Hematology

## 2020-11-07 VITALS — BP 156/93 | HR 95 | Temp 96.7°F | Resp 18 | Wt 188.7 lb

## 2020-11-07 DIAGNOSIS — D472 Monoclonal gammopathy: Secondary | ICD-10-CM

## 2020-11-07 DIAGNOSIS — Z7984 Long term (current) use of oral hypoglycemic drugs: Secondary | ICD-10-CM | POA: Diagnosis not present

## 2020-11-07 DIAGNOSIS — Z79899 Other long term (current) drug therapy: Secondary | ICD-10-CM | POA: Diagnosis not present

## 2020-11-07 DIAGNOSIS — C9000 Multiple myeloma not having achieved remission: Secondary | ICD-10-CM | POA: Diagnosis not present

## 2020-11-07 DIAGNOSIS — E1140 Type 2 diabetes mellitus with diabetic neuropathy, unspecified: Secondary | ICD-10-CM | POA: Diagnosis not present

## 2020-11-07 NOTE — Progress Notes (Signed)
[place], [place] [address]   CLINIC:  Medical Oncology/Hematology  PCP:  Celene Squibb, MD [address] [place] [address]  [phone]  REASON FOR VISIT:  Follow-up for smoldering multiple myeloma  PRIOR THERAPY: none  CURRENT THERAPY: surveillance  INTERVAL HISTORY:  [name], a 75 y.o. male, returns for routine follow-up for his smoldering multiple myeloma. Dimitriy was last seen on 10/07/20 by Dr. Benay Pike.  Today he reports feeling well. He reports neuropathy in his feet that has been present for the past 10 years, and he reports a history of DM. He is taking Gabapentin 4 times daily which provides some relief. He has a history of melanoma.   Previously to retirement he worked in [employer], and he denies any excess or unusual chemical exposure. His father had AML, his son had ALL, and his mother had multiple myeloma.   REVIEW OF SYSTEMS:  Review of Systems  Constitutional:  Positive for appetite change (75%) and fatigue (75%).  Neurological:  Positive for numbness (neuropathy in feet).  All other systems reviewed and are negative.  PAST MEDICAL/SURGICAL HISTORY:  Past Medical History:  Diagnosis Date   Adverse effect of unspecified drugs, medicaments and biological substances, initial encounter    Essential (primary) hypertension    Hyperlipidemia, unspecified    Impotence of organic origin    Malignant melanoma of skin, unspecified ([place])    Malignant neoplasm of prostate ([place])    Multiple myeloma (HCC)    Neuropathy    Non-pressure chronic ulcer of other part of unspecified foot with unspecified severity (HCC)    Reflux esophagitis    Restless legs syndrome    Tinea unguium    Type 2 diabetes mellitus with diabetic neuropathy, unspecified ([place])    Type 2 diabetes mellitus with other specified complication (HCC)    Unspecified atrial fibrillation (HCC)    Past Surgical History:   Procedure Laterality Date   CATARACT EXTRACTION     HERNIA REPAIR     melanoma removal     PROSTATE SURGERY      SOCIAL HISTORY:  Social History   Socioeconomic History   Marital status: Married    Spouse name: Not on file   Number of children: Not on file   Years of education: Not on file   Highest education level: Not on file  Occupational History   Not on file  Tobacco Use   Smoking status: Never   Smokeless tobacco: Never  Substance and Sexual Activity   Alcohol use: No    Alcohol/week: 0.0 standard drinks   Drug use: No   Sexual activity: Not Currently  Other Topics Concern   Not on file  Social History Narrative   Not on file   Social Determinants of Health   Financial Resource Strain: Low Risk    Difficulty of Paying Living Expenses: Not hard at all  Food Insecurity: No Food Insecurity   Worried About Charity fundraiser in the Last Year: Never true   Ran Out of Food in the Last Year: Never true  Transportation Needs: No Transportation Needs   Lack of Transportation (Medical): No   Lack of Transportation (Non-Medical): No  Physical Activity: Insufficiently Active   Days of Exercise per Week: 3 days   Minutes of Exercise per Session: 20 min  Stress: No Stress Concern Present   Feeling of Stress : Not at all  Social  Connections: Moderately Integrated   Frequency of Communication with Friends and Family: Twice a week   Frequency of Social Gatherings with Friends and Family: Twice a week   Attends Religious Services: 1 to 4 times per year   Active Member of [employer] or Organizations: No   Attends Archivist Meetings: Never   Marital Status: Married  Human resources officer Violence: Not At Risk   Fear of Current or Ex-Partner: No   Emotionally Abused: No   Physically Abused: No   Sexually Abused: No    FAMILY HISTORY:  No family history on file.  CURRENT MEDICATIONS:  Current Outpatient Medications  Medication Sig Dispense Refill    atenolol-chlorthalidone (TENORETIC) 50-25 MG per tablet Take 1 tablet by mouth daily. Takes 1/2 daily per Dr Luan Pulling     diclofenac sodium (VOLTAREN) 1 % GEL Apply 2 g topically daily as needed. (Patient not taking: Reported on 10/07/2020)     gabapentin (NEURONTIN) 300 MG capsule Take 300 mg by mouth every 4 (four) hours.     loperamide (IMODIUM A-D) 2 MG tablet Take 2 mg by mouth 4 (four) times daily as needed for diarrhea or loose stools. (Patient not taking: Reported on 10/07/2020)     losartan (COZAAR) 100 MG tablet Take 1 tablet by mouth daily.     metFORMIN (GLUCOPHAGE) 500 MG tablet Take 500 mg by mouth 2 (two) times daily with a meal.     pantoprazole (PROTONIX) 40 MG tablet Take 40 mg by mouth daily.     No current facility-administered medications for this visit.    ALLERGIES:  Allergies  Allergen Reactions   Penicillins Rash    Also swelling reaction     PHYSICAL EXAM:  Performance status (ECOG): 0 - Asymptomatic  Vitals:   11/07/20 1023  BP: (!) 156/93  Pulse: 95  Resp: 18  Temp: (!) 96.7 F (35.9 C)  SpO2: 97%   Wt Readings from Last 3 Encounters:  11/07/20 188 lb 11.4 oz (85.6 kg)  10/07/20 190 lb 3.2 oz (86.3 kg)  09/20/20 191 lb 8 oz (86.9 kg)   Physical Exam Vitals reviewed.  Constitutional:      Appearance: Normal appearance.  Cardiovascular:     Rate and Rhythm: Normal rate and regular rhythm.     Pulses: Normal pulses.     Heart sounds: Normal heart sounds.  Pulmonary:     Effort: Pulmonary effort is normal.     Breath sounds: Normal breath sounds.  Neurological:     General: No focal deficit present.     Mental Status: He is alert and oriented to person, place, and time.  Psychiatric:        Mood and Affect: Mood normal.        Behavior: Behavior normal.    LABORATORY DATA:  I have reviewed the labs as listed.  CBC Latest Ref Rng & Units 10/31/2020 09/20/2020 06/06/2020  WBC 4.0 - 10.5 K/uL 8.8 8.7 9.1  Hemoglobin 13.0 - 17.0 g/dL 14.5 13.1 14.4   Hematocrit 39.0 - 52.0 % 40.5 37.9(L) 40.5  Platelets 150 - 400 K/uL 243 271 262   CMP Latest Ref Rng & Units 10/31/2020 09/13/2020 06/10/2020  Glucose 70 - 99 mg/dL 171(H) 307(H) 145(H)  BUN 8 - 23 mg/dL 25(H) 21 19  Creatinine 0.61 - 1.24 mg/dL 1.29(H) 1.29(H) 1.15  Sodium 135 - 145 mmol/L 133(L) 132(L) 135  Potassium 3.5 - 5.1 mmol/L 3.4(L) 4.0 3.3(L)  Chloride 98 - 111 mmol/L 98  99 100  CO2 22 - 32 mmol/L _0 Calcium 8.9 - 10.3 mg/dL 8.8(L) 8.6(L) 8.6(L)  Total Protein 6.5 - 8.1 g/dL 10.0(H) 9.1(H) 9.1(H)  Total Bilirubin 0.3 - 1.2 mg/dL 1.0 1.3(H) 0.9  Alkaline Phos 38 - 126 U/L 77 76 75  AST 15 - 41 U/L 14(L) 22 14(L)  ALT 0 - 44 U/L _1 Component Value Date/Time   RBC 4.13 (L) 10/31/2020 1039   MCV 98.1 10/31/2020 1039   MCH 35.1 (H) 10/31/2020 1039   MCHC 35.8 10/31/2020 1039   RDW 14.3 10/31/2020 1039   LYMPHSABS 1.9 10/31/2020 1039   MONOABS 0.7 10/31/2020 1039   EOSABS 0.6 (H) 10/31/2020 1039   BASOSABS 0.1 10/31/2020 1039    DIAGNOSTIC IMAGING:  I have independently reviewed the scans and discussed with the patient. No results found.   ASSESSMENT:  IgG kappa smoldering multiple myeloma: - Skeletal survey in February 2022 was negative. - Bone marrow biopsy on 06/06/2020 with 20% plasma cells.  Myeloma FISH panel with monosomy 13/deletion 13.  Chromosome analysis 46, XY (20). - No "crab" features.  2.  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 - No major chemical exposures.  He had personal history of melanoma of the upper lip. - Father had acute myeloid leukemia.  Mother had multiple myeloma.  Son died of acute lymphocytic leukemia.   PLAN:  1.  IgG kappa smoldering multiple myeloma: - We have reviewed his labs from 10/31/2020.  Creatinine has been elevated since 09/13/2020. - Review of medication showed that he is ACE inhibitor dose was doubled during the same time. - M spike increased to  3.4 g from 3.3 g on 09/13/2020.  Hemoglobin was normal at 14.5.  Calcium was normal.  Kappa light chains increased to 347 from 286.  Ratio has increased to 26 from 20 previously. - We discussed the pros and cons of treatment of his smoldering myeloma and have decided to closely watch at this time. - I have recommended follow-up in 3 months with repeat myeloma panel, routine labs and skeletal survey.  He was told to call us should he notice any changes in his overall health.  2.  Neuropathy: - He had neuropathy in his feet for 10 years from diabetes.  He has burning pain. - Continue gabapentin 300 mg 4 times daily.  Orders placed this encounter:  No orders of the defined types were placed in this encounter.    Derek Jack, MD [place] [phone]   I, Thana Ates, am acting as a scribe for Dr. Derek Jack.  I, Derek Jack MD, have reviewed the above documentation for accuracy and completeness, and I agree with the above.

## 2020-11-07 NOTE — Patient Instructions (Addendum)
[place] at [facility] Discharge Instructions  You were seen today by Dr. Delton Coombes. He went over your recent results. You will be scheduled for X-rays of your skeleton prior to your next appointment. Dr. Delton Coombes will see you back in 3 months for labs and follow up.   Thank you for choosing [place] at [facility] to provide your oncology and hematology care.  To afford each patient quality time with our provider, please arrive at least 15 minutes before your scheduled appointment time.   If you have a lab appointment with the [place] please come in thru the Main Entrance and check in at the main information desk  You need to re-schedule your appointment should you arrive 10 or more minutes late.  We strive to give you quality time with our providers, and arriving late affects you and other patients whose appointments are after yours.  Also, if you no show three or more times for appointments you may be dismissed from the clinic at the providers discretion.     Again, thank you for choosing [facility].  Our hope is that these requests will decrease the amount of time that you wait before being seen by our physicians.       _____________________________________________________________  Should you have questions after your visit to [facility], please contact our office at [phone] between the hours of 8:00 a.m. and 4:30 p.m.  Voicemails left after 4:00 p.m. will not be returned until the following business day.  For prescription refill requests, have your pharmacy contact our office and allow 72 hours.    Cancer Center Support Programs:   > Cancer Support Group  2nd Tuesday of the month 1pm-2pm, Journey Room

## 2020-11-08 NOTE — Telephone Encounter (Signed)
Patient has decided to continue surveillance and will follow back up in 3 months with provider.  [place] Patient [place] Phone [phone] Fax [phone] 11/08/2020 1:28 PM

## 2020-11-16 ENCOUNTER — Other Ambulatory Visit: Payer: Self-pay

## 2020-11-16 ENCOUNTER — Ambulatory Visit: Payer: Medicare Other | Admitting: Podiatry

## 2020-11-16 VITALS — BP 144/86 | HR 66 | Temp 97.8°F

## 2020-11-16 DIAGNOSIS — M2041 Other hammer toe(s) (acquired), right foot: Secondary | ICD-10-CM | POA: Diagnosis not present

## 2020-11-16 DIAGNOSIS — M2012 Hallux valgus (acquired), left foot: Secondary | ICD-10-CM

## 2020-11-16 DIAGNOSIS — M79674 Pain in right toe(s): Secondary | ICD-10-CM

## 2020-11-16 DIAGNOSIS — B351 Tinea unguium: Secondary | ICD-10-CM | POA: Diagnosis not present

## 2020-11-16 DIAGNOSIS — L84 Corns and callosities: Secondary | ICD-10-CM | POA: Diagnosis not present

## 2020-11-16 DIAGNOSIS — E1142 Type 2 diabetes mellitus with diabetic polyneuropathy: Secondary | ICD-10-CM | POA: Diagnosis not present

## 2020-11-16 DIAGNOSIS — M2042 Other hammer toe(s) (acquired), left foot: Secondary | ICD-10-CM | POA: Diagnosis not present

## 2020-11-16 DIAGNOSIS — M2011 Hallux valgus (acquired), right foot: Secondary | ICD-10-CM | POA: Diagnosis not present

## 2020-11-16 DIAGNOSIS — M79675 Pain in left toe(s): Secondary | ICD-10-CM

## 2020-11-16 DIAGNOSIS — E119 Type 2 diabetes mellitus without complications: Secondary | ICD-10-CM

## 2020-11-16 NOTE — Progress Notes (Signed)
Subjective: [name] [name] presents today referred by [name] Squibb, MD for diabetic foot evaluation. He is accompanied by his wife on today's visit. He is requesting diabetic shoes on today's visit. [name] [name] has been followed by [name] [name] for many years for his podiatric care and wishes to continue to do so.   Patient relates 13 year history of diabetes.  Patient states h/o foot wound in 2016 managed by [name] [name].  Patient did not check blood glucose this morning and doesn't monitor on a daily basis.  PCP is [name] Squibb, MD , and last visit was two months ago.  Past Medical History:  Diagnosis Date   Adverse effect of unspecified drugs, medicaments and biological substances, initial encounter    Essential (primary) hypertension    Hyperlipidemia, unspecified    Impotence of organic origin    Malignant melanoma of skin, unspecified ([place])    Malignant neoplasm of prostate ([place])    Multiple myeloma ([place])    Neuropathy    Non-pressure chronic ulcer of other part of unspecified foot with unspecified severity (HCC)    Reflux esophagitis    Restless legs syndrome    Tinea unguium    Type 2 diabetes mellitus with diabetic neuropathy, unspecified ([place])    Type 2 diabetes mellitus with other specified complication (HCC)    Unspecified atrial fibrillation [facility])     Patient Active Problem List   Diagnosis Date Noted   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   Essential (primary) hypertension    Hyperlipidemia, unspecified    Restless legs syndrome    Adverse effect of unspecified drugs, medicaments and biological substances, initial encounter    Type 2 diabetes mellitus with diabetic neuropathy, unspecified ([place])    Malignant melanoma of skin, unspecified ([place])    Malignant neoplasm of prostate ([place])    Non-pressure chronic ulcer of other part of unspecified foot with unspecified severity ([place])    Tinea unguium    Type 2 diabetes mellitus with other specified  complication ([place])    Unspecified atrial fibrillation ([place])    Malignant melanoma of skin of upper lip ([place]) 12/17/2018   A-fib ([place]) 06/23/2014    Past Surgical History:  Procedure Laterality Date   CATARACT EXTRACTION     HERNIA REPAIR     melanoma removal     PROSTATE SURGERY      Current Outpatient Medications on File Prior to Visit  Medication Sig Dispense Refill   atenolol-chlorthalidone (TENORETIC) 50-25 MG per tablet Take 1 tablet by mouth daily. Takes 1/2 daily per Dr [name] [name]     azithromycin (ZITHROMAX) 250 MG tablet Take by mouth.     diclofenac sodium (VOLTAREN) 1 % GEL Apply 2 g topically daily as needed.     gabapentin (NEURONTIN) 300 MG capsule Take 300 mg by mouth every 4 (four) hours.     loperamide (IMODIUM A-D) 2 MG tablet Take 2 mg by mouth 4 (four) times daily as needed for diarrhea or loose stools.     losartan (COZAAR) 100 MG tablet Take 1 tablet by mouth daily.     metFORMIN (GLUCOPHAGE) 500 MG tablet Take 500 mg by mouth 2 (two) times daily with a meal.     pantoprazole (PROTONIX) 40 MG tablet Take 40 mg by mouth daily.     No current facility-administered medications on file prior to visit.     Allergies  Allergen Reactions   Penicillins Rash    Also swelling reaction  Social History   Occupational History   Not on file  Tobacco Use   Smoking status: Never   Smokeless tobacco: Never  Substance and Sexual Activity   Alcohol use: No    Alcohol/week: 0.0 standard drinks   Drug use: No   Sexual activity: Not Currently    No family history on file.  Immunization History  Administered Date(s) Administered   Influenza Inj Mdck Quad Pf 12/23/2015   Influenza, High Dose Seasonal PF 12/20/2016, 12/18/2017   Influenza,inj,Quad PF,6+ Mos 01/29/2019   Influenza-Unspecified 02/05/2014   Pneumococcal Conjugate-13 02/09/2016   Pneumococcal Polysaccharide-23 03/14/2011, 12/20/2016    Objective: Vitals:   11/16/20 1334  BP: (!) 144/86  Pulse:  66  Temp: 97.8 F (36.6 C)    [name] [name] is a pleasant 75 y.o. male WD, WN in NAD. AAO X 3.  Vascular Examination: Capillary fill time to digits <3 seconds b/l lower extremities. Faintly palpable DP pulse(s) b/l lower extremities. Faintly palpable PT pulse(s) b/l lower extremities. Pedal hair absent. Lower extremity skin temperature gradient within normal limits. No pain with calf compression b/l. No edema noted b/l lower extremities.  Dermatological Examination: Pedal skin with normal turgor, texture and tone b/l lower extremities. No open wounds b/l lower extremities. No interdigital macerations b/l lower extremities. Toenails 1-5 b/l elongated, discolored, dystrophic, thickened, crumbly with subungual debris and tenderness to dorsal palpation. Hyperkeratotic lesion(s) b/l lower extremities.  No erythema, no edema, no drainage, no fluctuance.  Musculoskeletal Examination: Normal muscle strength 5/5 to all lower extremity muscle groups bilaterally. Hallux valgus with bunion deformity noted b/l lower extremities. Hammertoe(s) noted to the 2-5 bilaterally.  Footwear Assessment: Does the patient wear appropriate shoes? Yes. Does the patient need inserts/orthotics?  Yes.  Neurological Examination: Protective sensation diminished with 10g monofilament b/l. Vibratory sensation decreased b/l.  Assessment: 1. Pain due to onychomycosis of toenails of both feet   2. Corns and callosities   3. Hallux valgus, acquired, bilateral   4. Acquired hammertoes of both feet   5. Diabetic peripheral neuropathy associated with type 2 diabetes mellitus ([place])   6. Encounter for diabetic foot exam ([place])     ADA Risk Categorization: High Risk:  Patient has one or more of the following: Loss of protective sensation Absent pedal pulses Severe Foot deformity History of foot ulcer  Plan: -Examined patient. -Diabetic foot examination performed today. -Continue diabetic foot care principles:  inspect feet daily, monitor glucose as recommended by PCP and/or Endocrinologist, and follow prescribed diet per PCP, Endocrinologist and/or dietician. -Patient to continue soft, supportive shoe gear daily. Start procedure for diabetic shoes. Patient qualifies based on diagnoses. -Toenails 1-5 b/l were debrided in length and girth with sterile nail nippers and dremel without iatrogenic bleeding.  -Callus(es) b/l feet pared utilizing sterile scalpel blade without complication or incident. Total number debrided =2. -Patient to report any pedal injuries to medical professional immediately. -Patient/POA to call should there be question/concern in the interim. -He will continue his routine foot care with [name] [name].  No follow-ups on file.  Marzetta Board, DPM

## 2020-11-16 NOTE — Patient Instructions (Signed)
Diabetes Mellitus and Foot Care Foot care is an important part of your health, especially when you have diabetes. Diabetes may cause you to have problems because of poor blood flow (circulation) to your feet and legs, which can cause your skin to: Become thinner and drier. Break more easily. Heal more slowly. Peel and crack. You may also have nerve damage (neuropathy) in your legs and feet, causing decreased feeling in them. This means that you may not notice minor injuries to your feet that could lead to more serious problems. Noticing and addressing any potential problems early is the best wayto prevent future foot problems. How to care for your feet Foot hygiene  Wash your feet daily with warm water and mild soap. Do not use hot water. Then, pat your feet and the areas between your toes until they are completely dry. Do not soak your feet as this can dry your skin. Trim your toenails straight across. Do not dig under them or around the cuticle. File the edges of your nails with an emery board or nail file. Apply a moisturizing lotion or petroleum jelly to the skin on your feet and to dry, brittle toenails. Use lotion that does not contain alcohol and is unscented. Do not apply lotion between your toes.  Shoes and socks Wear clean socks or stockings every day. Make sure they are not too tight. Do not wear knee-high stockings since they may decrease blood flow to your legs. Wear shoes that fit properly and have enough cushioning. Always look in your shoes before you put them on to be sure there are no objects inside. To break in new shoes, wear them for just a few hours a day. This prevents injuries on your feet. Wounds, scrapes, corns, and calluses  Check your feet daily for blisters, cuts, bruises, sores, and redness. If you cannot see the bottom of your feet, use a mirror or ask someone for help. Do not cut corns or calluses or try to remove them with medicine. If you find a minor scrape,  cut, or break in the skin on your feet, keep it and the skin around it clean and dry. You may clean these areas with mild soap and water. Do not clean the area with peroxide, alcohol, or iodine. If you have a wound, scrape, corn, or callus on your foot, look at it several times a day to make sure it is healing and not infected. Check for: Redness, swelling, or pain. Fluid or blood. Warmth. Pus or a bad smell.  General tips Do not cross your legs. This may decrease blood flow to your feet. Do not use heating pads or hot water bottles on your feet. They may burn your skin. If you have lost feeling in your feet or legs, you may not know this is happening until it is too late. Protect your feet from hot and cold by wearing shoes, such as at the beach or on hot pavement. Schedule a complete foot exam at least once a year (annually) or more often if you have foot problems. Report any cuts, sores, or bruises to your health care provider immediately. Where to find more information American Diabetes Association: www.diabetes.org Association of Diabetes Care & Education Specialists: www.diabeteseducator.org Contact a health care provider if: You have a medical condition that increases your risk of infection and you have any cuts, sores, or bruises on your feet. You have an injury that is not healing. You have redness on your legs or feet.  You feel burning or tingling in your legs or feet. You have pain or cramps in your legs and feet. Your legs or feet are numb. Your feet always feel cold. You have pain around any toenails. Get help right away if: You have a wound, scrape, corn, or callus on your foot and: You have pain, swelling, or redness that gets worse. You have fluid or blood coming from the wound, scrape, corn, or callus. Your wound, scrape, corn, or callus feels warm to the touch. You have pus or a bad smell coming from the wound, scrape, corn, or callus. You have a fever. You have a red  line going up your leg. Summary Check your feet every day for blisters, cuts, bruises, sores, and redness. Apply a moisturizing lotion or petroleum jelly to the skin on your feet and to dry, brittle toenails. Wear shoes that fit properly and have enough cushioning. If you have foot problems, report any cuts, sores, or bruises to your health care provider immediately. Schedule a complete foot exam at least once a year (annually) or more often if you have foot problems. This information is not intended to replace advice given to you by your health care provider. Make sure you discuss any questions you have with your healthcare provider. Document Revised: 10/08/2019 Document Reviewed: 10/08/2019 Elsevier Patient Education  [place].  Diabetic Neuropathy Diabetic neuropathy refers to nerve damage that is caused by diabetes. Over time, people with diabetes can develop nerve damage throughout the body. There are several types of diabetic neuropathy: Peripheral neuropathy. This is the most common type of diabetic neuropathy. It damages the nerves that carry signals between the spinal cord and other parts of the body (peripheral nerves). This usually affects nerves in the feet, legs, hands, and arms. Autonomic neuropathy. This type causes damage to nerves that control involuntary functions (autonomic nerves). Involuntary functions are functions of the body that you do not control. They include heartbeat, body temperature, blood pressure, urination, digestion, sweating, sexual function, or response to changes in blood glucose. Focal neuropathy. This type of nerve damage affects one area of the body, such as an arm, a leg, or the face. The injury may involve one nerve or a small group of nerves. Focal neuropathy can be painful and unpredictable. It occurs most often in older adults with diabetes. This often develops suddenly, but usually improves over time and does not cause long-term  problems. Proximal neuropathy. This type of nerve damage affects the nerves of the thighs, hips, buttocks, or legs. It causes severe pain, weakness, and muscle death (atrophy), usually in the thigh muscles. It is more common among older men and people who have type 2 diabetes. The length of recovery time may vary. What are the causes? Peripheral, autonomic, and focal neuropathies are caused by diabetes that is not well controlled with treatment. The cause of proximal neuropathy is not known, but it may be caused by inflammation related to uncontrolled bloodglucose levels. What are the signs or symptoms? Peripheral neuropathy Peripheral neuropathy develops slowly over time. When the nerves of the feet and legs no longer work, you may experience: Burning, stabbing, or aching pain in the legs or feet. Pain or cramping in the legs or feet. Loss of feeling (numbness) and inability to feel pressure or pain in the feet. This can lead to: Thick calluses or sores on areas of constant pressure. Ulcers. Reduced ability to feel temperature changes. Foot deformities. Muscle weakness. Loss of balance or coordination. Autonomic  neuropathy The symptoms of autonomic neuropathy vary depending on which nerves are affected. Symptoms may include: Problems with digestion, such as: Nausea or vomiting. Poor appetite. Bloating. Diarrhea or constipation. Trouble swallowing. Losing weight without trying to. Problems with the heart, blood, and lungs, such as: Dizziness, especially when standing up. Fainting. Shortness of breath. Irregular heartbeat. Bladder problems, such as: Trouble starting or stopping urination. Leaking urine. Trouble emptying the bladder. Urinary tract infections (UTIs). Problems with other body functions, such as: Sweat. You may sweat too much or too little. Temperature. You might get hot easily. Or, you might feel cold more than usual. Sexual function. Men may not be able to get or  maintain an erection. Women may have vaginal dryness and difficulty with arousal. Focal neuropathy Symptoms affect only one area of the body. Common symptoms include: Numbness. Tingling. Burning pain. Prickling feeling. Very sensitive skin. Weakness. Inability to move (paralysis). Muscle twitching. Muscles getting smaller (wasting). Poor coordination. Double or blurred vision. Proximal neuropathy Sudden, severe pain in the hip, thigh, or buttocks. Pain may spread from the back into the legs (sciatica). Pain and numbness in the arms and legs. Tingling. Loss of bladder control or bowel control. Weakness and wasting of thigh muscles. Difficulty getting up from a seated position. Abdominal swelling. Unexplained weight loss. How is this diagnosed? Diagnosis varies depending on the type of neuropathy your health care providersuspects. Peripheral neuropathy Your health care provider will do a neurologic exam. This exam checks your reflexes, how you move, and what you can feel. You may have other tests, such as: Blood tests. Tests of the fluid that surrounds the spinal cord (lumbar puncture). CT scan. MRI. Checking the nerves that control muscles (electromyogram, or EMG). Checking how quickly signals pass through your nerves (nerve conduction study). Checking a small piece of a nerve using a microscope (biopsy). Autonomic neuropathy You may have tests, such as: Tests to measure your blood pressure and heart rate. You may be secured to an exam table that moves you from a lying position to an upright position (table tilt test). Breathing tests to check your lungs. Tests to check how food moves through the digestive system (gastric emptying tests). Blood, sweat, or urine tests. Ultrasound of your bladder. Spinal fluid tests. Focal neuropathy This condition may be diagnosed with: A neurologic exam. CT scan. MRI. EMG. Nerve conduction study. Proximal neuropathy There is no test  to diagnose this type of neuropathy. You may have tests to rule out other possible causes of this type of neuropathy. Tests may include: X-rays of your spine and lumbar region. Lumbar puncture. MRI. How is this treated? The goal of treatment is to keep nerve damage from getting worse. Treatment may include: Following your diabetes management plan. This will help keep your blood glucose level and your A1C level within your target range. This is the most important treatment. Using prescription pain medicine. Follow these instructions at home: Diabetes management Follow your diabetes management plan as told by your health care provider. Check your blood glucose levels. Keep your blood glucose in your target range. Have your A1C level checked at least two times a year, or as often as told. Take over the counter and prescription medicines only as told by your health care provider. This includes insulin and diabetes medicine.  Lifestyle  Do not use any products that contain nicotine or tobacco, such as cigarettes, e-cigarettes, and chewing tobacco. If you need help quitting, ask your health care provider. Be physically active every day.  Include strength training and balance exercises. Follow a healthy meal plan. Work with your health care provider to manage your blood pressure.  General instructions Ask your health care provider if the medicine prescribed to you requires you to avoid driving or using machinery. Check your skin and feet every day for cuts, bruises, redness, blisters, or sores. Keep all follow-up visits. This is important. Contact a health care provider if: You have burning, stabbing, or aching pain in your legs or feet. You are unable to feel pressure or pain in your feet. You develop problems with digestion, such as: Nausea. Vomiting. Bloating. Constipation. Diarrhea. Abdominal pain. You have difficulty with urination, such as: Inability to control when you urinate  (incontinence). Inability to completely empty the bladder (retention). You feel as if your heart is racing (palpitations). You feel dizzy, weak, or faint when you stand up. Get help right away if: You cannot urinate. You have sudden weakness or loss of coordination. You have trouble speaking. You have pain or pressure in your chest. You have an irregular heartbeat. You have sudden inability to move a part of your body. These symptoms may represent a serious problem that is an emergency. Do not wait to see if the symptoms will go away. Get medical help right away. Call your local emergency services (911 in the [place] Do not drive yourself to the hospital. Summary Diabetic neuropathy is nerve damage that is caused by diabetes. It can cause numbness and pain in the arms, legs, digestive tract, heart, and other body systems. This condition is treated by keeping your blood glucose level and your A1C level within your target range. This can help prevent neuropathy from getting worse. Check your skin and feet every day for cuts, bruises, redness, blisters, or sores. Do not use any products that contain nicotine or tobacco, such as cigarettes, e-cigarettes, and chewing tobacco. This information is not intended to replace advice given to you by your health care provider. Make sure you discuss any questions you have with your healthcare provider. Document Revised: 07/30/2019 Document Reviewed: 07/30/2019 Elsevier Patient Education  2022 [place] are small areas of thickened skin that form on the top, sides, or tip of a toe. Corns have a cone-shaped core with a point that can press on a nervebelow. This causes pain. Calluses are areas of thickened skin that can form anywhere on the body, including the hands, fingers, palms, soles of the feet, and heels. Calluses areusually larger than corns. What are the causes? Corns and calluses are caused by rubbing (friction) or  pressure, such as from shoes that are too tight or do not fit properly. What increases the risk? Corns are more likely to develop in people who have misshapen toes (toe deformities), such as hammer toes. Calluses can form with friction to any area of the skin. They are more likely to develop in people who: Work with their hands. Wear shoes that fit poorly, are too tight, or are high-heeled. Have toe deformities. What are the signs or symptoms? Symptoms of a corn or callus include: A hard growth on the skin. Pain or tenderness under the skin. Redness and swelling. Increased discomfort while wearing tight-fitting shoes, if your feet are affected. If a corn or callus becomes infected, symptoms may include: Redness and swelling that gets worse. Pain. Fluid, blood, or pus draining from the corn or callus. How is this diagnosed? Corns and calluses may be diagnosed based on your symptoms, your medicalhistory,  and a physical exam. How is this treated? Treatment for corns and calluses may include: Removing the cause of the friction or pressure. This may involve: Changing your shoes. Wearing shoe inserts (orthotics) or other protective layers in your shoes, such as a corn pad. Wearing gloves. Applying medicine to the skin (topical medicine) to help soften skin in the hardened, thickened areas. Removing layers of dead skin with a file to reduce the size of the corn or callus. Removing the corn or callus with a scalpel or laser. Taking antibiotic medicines, if your corn or callus is infected. Having surgery, if a toe deformity is the cause. Follow these instructions at home:  Take over-the-counter and prescription medicines only as told by your health care provider. If you were prescribed an antibiotic medicine, take it as told by your health care provider. Do not stop taking it even if your condition improves. Wear shoes that fit well. Avoid wearing high-heeled shoes and shoes that are too  tight or too loose. Wear any padding, protective layers, gloves, or orthotics as told by your health care provider. Soak your hands or feet. Then use a file or pumice stone to soften your corn or callus. Do this as told by your health care provider. Check your corn or callus every day for signs of infection. Contact a health care provider if: Your symptoms do not improve with treatment. You have redness or swelling that gets worse. Your corn or callus becomes painful. You have fluid, blood, or pus coming from your corn or callus. You have new symptoms. Get help right away if: You develop severe pain with redness. Summary Corns are small areas of thickened skin that form on the top, sides, or tip of a toe. These can be painful. Calluses are areas of thickened skin that can form anywhere on the body, including the hands, fingers, palms, and soles of the feet. Calluses are usually larger than corns. Corns and calluses are caused by rubbing (friction) or pressure, such as from shoes that are too tight or do not fit properly. Treatment may include wearing padding, protective layers, gloves, or orthotics as told by your health care provider. This information is not intended to replace advice given to you by your health care provider. Make sure you discuss any questions you have with your healthcare provider. Document Revised: 07/16/2019 Document Reviewed: 07/16/2019 Elsevier Patient Education  2022 [employer].

## 2020-11-17 DIAGNOSIS — D2262 Melanocytic nevi of left upper limb, including shoulder: Secondary | ICD-10-CM | POA: Diagnosis not present

## 2020-11-17 DIAGNOSIS — Z08 Encounter for follow-up examination after completed treatment for malignant neoplasm: Secondary | ICD-10-CM | POA: Diagnosis not present

## 2020-11-17 DIAGNOSIS — Z85820 Personal history of malignant melanoma of skin: Secondary | ICD-10-CM | POA: Diagnosis not present

## 2020-11-17 DIAGNOSIS — D225 Melanocytic nevi of trunk: Secondary | ICD-10-CM | POA: Diagnosis not present

## 2020-11-17 DIAGNOSIS — Z1283 Encounter for screening for malignant neoplasm of skin: Secondary | ICD-10-CM | POA: Diagnosis not present

## 2020-11-17 DIAGNOSIS — D485 Neoplasm of uncertain behavior of skin: Secondary | ICD-10-CM | POA: Diagnosis not present

## 2020-11-18 ENCOUNTER — Telehealth: Payer: Self-pay | Admitting: Podiatry

## 2020-11-18 NOTE — Telephone Encounter (Signed)
Pt left message stating he is needing an appt for diabetic shoes..   I returned call and left message for pt to call to schedule an appt that my next available is 8.26 or 9.12

## 2020-11-20 ENCOUNTER — Encounter: Payer: Self-pay | Admitting: Podiatry

## 2020-11-25 ENCOUNTER — Other Ambulatory Visit: Payer: Self-pay

## 2020-11-25 ENCOUNTER — Ambulatory Visit (INDEPENDENT_AMBULATORY_CARE_PROVIDER_SITE_OTHER): Payer: Medicare Other

## 2020-11-25 DIAGNOSIS — E1142 Type 2 diabetes mellitus with diabetic polyneuropathy: Secondary | ICD-10-CM

## 2020-11-25 NOTE — Progress Notes (Signed)
Patient in office today for diabetic shoe selection and custom inserts. Patient is a size 10 wide in men's shoe. Dr. Nevada Crane currently is treating patient for diabetes. Patient selected shoe [id number] Apex Strap Walker as a first choice and Shoe 1260M Apex conform double strap as the backup selection. Patient was advised the office will call when shoes are ready for pick-up. Patient verbalized understanding.

## 2020-12-09 DIAGNOSIS — E11319 Type 2 diabetes mellitus with unspecified diabetic retinopathy without macular edema: Secondary | ICD-10-CM | POA: Diagnosis not present

## 2020-12-09 DIAGNOSIS — H5212 Myopia, left eye: Secondary | ICD-10-CM | POA: Diagnosis not present

## 2020-12-19 ENCOUNTER — Telehealth: Payer: Self-pay | Admitting: Podiatry

## 2020-12-19 NOTE — Telephone Encounter (Signed)
Pt left message asking if we had received the documents from pcp for his diabetic shoes and to call if we had not.  I returned call and the first time it was busy and the second time it just rings. The fax number has changed for Dr Nevada Crane and I have the correct fax number and faxed it to the correct number today to get Dr Nevada Crane to sign the documents.

## 2021-01-13 ENCOUNTER — Telehealth: Payer: Self-pay | Admitting: Podiatry

## 2021-01-13 NOTE — Telephone Encounter (Signed)
Diabetic shoes/inserts in..lvm for pt to call to schedule an appt to pick them up. 

## 2021-01-18 ENCOUNTER — Ambulatory Visit (INDEPENDENT_AMBULATORY_CARE_PROVIDER_SITE_OTHER): Payer: Medicare Other | Admitting: Podiatry

## 2021-01-18 ENCOUNTER — Other Ambulatory Visit: Payer: Self-pay

## 2021-01-18 DIAGNOSIS — M2041 Other hammer toe(s) (acquired), right foot: Secondary | ICD-10-CM | POA: Diagnosis not present

## 2021-01-18 DIAGNOSIS — M2012 Hallux valgus (acquired), left foot: Secondary | ICD-10-CM | POA: Diagnosis not present

## 2021-01-18 DIAGNOSIS — M2011 Hallux valgus (acquired), right foot: Secondary | ICD-10-CM | POA: Diagnosis not present

## 2021-01-18 DIAGNOSIS — E1142 Type 2 diabetes mellitus with diabetic polyneuropathy: Secondary | ICD-10-CM

## 2021-01-18 DIAGNOSIS — M2042 Other hammer toe(s) (acquired), left foot: Secondary | ICD-10-CM | POA: Diagnosis not present

## 2021-01-18 NOTE — Progress Notes (Signed)
Here for diabetic shoe dispense.

## 2021-02-07 ENCOUNTER — Inpatient Hospital Stay (HOSPITAL_COMMUNITY): Payer: Medicare Other | Attending: Hematology

## 2021-02-07 ENCOUNTER — Encounter (HOSPITAL_COMMUNITY)
Admission: RE | Admit: 2021-02-07 | Discharge: 2021-02-07 | Disposition: A | Discharge status: Home / Self Care | Payer: Medicare Other | Source: Ambulatory Visit | Attending: Hematology | Admitting: Hematology

## 2021-02-07 ENCOUNTER — Other Ambulatory Visit: Payer: Self-pay

## 2021-02-07 DIAGNOSIS — D472 Monoclonal gammopathy: Secondary | ICD-10-CM

## 2021-02-07 DIAGNOSIS — Z7984 Long term (current) use of oral hypoglycemic drugs: Secondary | ICD-10-CM | POA: Insufficient documentation

## 2021-02-07 DIAGNOSIS — E1142 Type 2 diabetes mellitus with diabetic polyneuropathy: Secondary | ICD-10-CM | POA: Insufficient documentation

## 2021-02-07 DIAGNOSIS — C9000 Multiple myeloma not having achieved remission: Secondary | ICD-10-CM | POA: Insufficient documentation

## 2021-02-07 LAB — CBC WITH DIFFERENTIAL/PLATELET
Abs Immature Granulocytes: 0.03 10*3/uL (ref 0.00–0.07)
Basophils Absolute: 0.1 10*3/uL (ref 0.0–0.1)
Basophils Relative: 1 %
Eosinophils Absolute: 0.7 10*3/uL — ABNORMAL HIGH (ref 0.0–0.5)
Eosinophils Relative: 8 %
HCT: 40 % (ref 39.0–52.0)
Hemoglobin: 14 g/dL (ref 13.0–17.0)
Immature Granulocytes: 0 %
Lymphocytes Relative: 21 %
Lymphs Abs: 1.9 10*3/uL (ref 0.7–4.0)
MCH: 34.3 pg — ABNORMAL HIGH (ref 26.0–34.0)
MCHC: 35 g/dL (ref 30.0–36.0)
MCV: 98 fL (ref 80.0–100.0)
Monocytes Absolute: 0.8 10*3/uL (ref 0.1–1.0)
Monocytes Relative: 9 %
Neutro Abs: 5.6 10*3/uL (ref 1.7–7.7)
Neutrophils Relative %: 61 %
Platelets: 245 10*3/uL (ref 150–400)
RBC: 4.08 MIL/uL — ABNORMAL LOW (ref 4.22–5.81)
RDW: 14.4 % (ref 11.5–15.5)
WBC: 9.1 10*3/uL (ref 4.0–10.5)
nRBC: 0 % (ref 0.0–0.2)

## 2021-02-07 LAB — LACTATE DEHYDROGENASE: LDH: 100 U/L (ref 98–192)

## 2021-02-07 MED ORDER — TECHNETIUM TC 99M MEDRONATE IV KIT
20.0000 | PACK | Freq: Once | INTRAVENOUS | Status: AC | PRN
  Administered 2021-02-07: 22 via INTRAVENOUS

## 2021-02-08 LAB — IGG, IGA, IGM
IgA: 86 mg/dL (ref 61–437)
IgG (Immunoglobin G), Serum: 4717 mg/dL — ABNORMAL HIGH (ref 603–1613)
IgM (Immunoglobulin M), Srm: 58 mg/dL (ref 15–143)

## 2021-02-08 LAB — KAPPA/LAMBDA LIGHT CHAINS
Kappa free light chain: 336.2 mg/L — ABNORMAL HIGH (ref 3.3–19.4)
Kappa, lambda light chain ratio: 29.75 — ABNORMAL HIGH (ref 0.26–1.65)
Lambda free light chains: 11.3 mg/L (ref 5.7–26.3)

## 2021-02-09 LAB — PROTEIN ELECTROPHORESIS, SERUM
A/G Ratio: 0.7 (ref 0.7–1.7)
Albumin ELP: 3.7 g/dL (ref 2.9–4.4)
Alpha-1-Globulin: 0.2 g/dL (ref 0.0–0.4)
Alpha-2-Globulin: 0.7 g/dL (ref 0.4–1.0)
Beta Globulin: 0.9 g/dL (ref 0.7–1.3)
Gamma Globulin: 3.8 g/dL — ABNORMAL HIGH (ref 0.4–1.8)
Globulin, Total: 5.6 g/dL — ABNORMAL HIGH (ref 2.2–3.9)
M-Spike, %: 3.4 g/dL — ABNORMAL HIGH
Total Protein ELP: 9.3 g/dL — ABNORMAL HIGH (ref 6.0–8.5)

## 2021-02-09 LAB — BETA-2-GLYCOPROTEIN I ABS, IGG/M/A
Beta-2 Glyco I IgG: <9 GPI IgG units (ref 0–20)
Beta-2-Glycoprotein I IgA: <9 GPI IgA units (ref 0–25)
Beta-2-Glycoprotein I IgM: <9 GPI IgM units (ref 0–32)

## 2021-02-10 DIAGNOSIS — I10 Essential (primary) hypertension: Secondary | ICD-10-CM | POA: Diagnosis not present

## 2021-02-13 NOTE — Progress Notes (Signed)
[name] [name], [facility] [address]   CLINIC:  Medical Oncology/Hematology  PCP:  Celene Squibb, MD [address] [place] [address] [phone]   REASON FOR VISIT:  Follow-up for smoldering multiple myeloma  PRIOR THERAPY: none  CURRENT THERAPY: surveillance  BRIEF ONCOLOGIC HISTORY:  Oncology History   No history exists.    CANCER STAGING: Cancer Staging No matching staging information was found for the patient.  INTERVAL HISTORY:  [name] [name], a 75 y.o. male, returns for routine follow-up of his smoldering multiple myeloma. [name] [name] was last seen on 11/07/2020.   Today he reports feeling good. He reports continued neuropathy on the top of his feet which is stable and for which he takes Gabapentin four times daily. He denies any current pains or bone pains. He denies any recent infection, antibiotic use, and rash.   REVIEW OF SYSTEMS:  Review of Systems  Constitutional:  Negative for appetite change and fatigue (75%).  Musculoskeletal:  Negative for arthralgias and myalgias.  Skin:  Negative for rash.  Neurological:  Positive for numbness (feet neuropathy).  All other systems reviewed and are negative.  PAST MEDICAL/SURGICAL HISTORY:  Past Medical History:  Diagnosis Date   Adverse effect of unspecified drugs, medicaments and biological substances, initial encounter    Essential (primary) hypertension    Hyperlipidemia, unspecified    Impotence of organic origin    Malignant melanoma of skin, unspecified ([place])    Malignant neoplasm of prostate ([place])    Multiple myeloma (HCC)    Neuropathy    Non-pressure chronic ulcer of other part of unspecified foot with unspecified severity (HCC)    Reflux esophagitis    Restless legs syndrome    Tinea unguium    Type 2 diabetes mellitus with diabetic neuropathy, unspecified ([place])    Type 2 diabetes mellitus with other specified complication (HCC)    Unspecified atrial  fibrillation (HCC)    Past Surgical History:  Procedure Laterality Date   CATARACT EXTRACTION     HERNIA REPAIR     melanoma removal     PROSTATE SURGERY      SOCIAL HISTORY:  Social History   Socioeconomic History   Marital status: Married    Spouse name: Not on file   Number of children: Not on file   Years of education: Not on file   Highest education level: Not on file  Occupational History   Not on file  Tobacco Use   Smoking status: Never   Smokeless tobacco: Never  Substance and Sexual Activity   Alcohol use: No    Alcohol/week: 0.0 standard drinks   Drug use: No   Sexual activity: Not Currently  Other Topics Concern   Not on file  Social History Narrative   Not on file   Social Determinants of Health   Financial Resource Strain: Low Risk    Difficulty of Paying Living Expenses: Not hard at all  Food Insecurity: No Food Insecurity   Worried About Charity fundraiser in the Last Year: Never true   Ran Out of Food in the Last Year: Never true  Transportation Needs: No Transportation Needs   Lack of Transportation (Medical): No   Lack of Transportation (Non-Medical): No  Physical Activity: Insufficiently Active   Days of Exercise per Week: 3 days   Minutes of Exercise per Session: 20 min  Stress: No Stress Concern Present   Feeling of Stress : Not  at all  Social Connections: Moderately Integrated   Frequency of Communication with Friends and Family: Twice a week   Frequency of Social Gatherings with Friends and Family: Twice a week   Attends Religious Services: 1 to 4 times per year   Active Member of [employer] or Organizations: No   Attends Archivist Meetings: Never   Marital Status: Married  Human resources officer Violence: Not At Risk   Fear of Current or Ex-Partner: No   Emotionally Abused: No   Physically Abused: No   Sexually Abused: No    FAMILY HISTORY:  No family history on file.  CURRENT MEDICATIONS:  Current Outpatient Medications   Medication Sig Dispense Refill   atenolol-chlorthalidone (TENORETIC) 50-25 MG per tablet Take 1 tablet by mouth daily. Takes 1/2 daily per Dr Luan Pulling     azithromycin (ZITHROMAX) 250 MG tablet Take by mouth.     diclofenac sodium (VOLTAREN) 1 % GEL Apply 2 g topically daily as needed.     gabapentin (NEURONTIN) 300 MG capsule Take 300 mg by mouth every 4 (four) hours.     loperamide (IMODIUM A-D) 2 MG tablet Take 2 mg by mouth 4 (four) times daily as needed for diarrhea or loose stools.     losartan (COZAAR) 100 MG tablet Take 1 tablet by mouth daily.     metFORMIN (GLUCOPHAGE) 500 MG tablet Take 500 mg by mouth 2 (two) times daily with a meal.     pantoprazole (PROTONIX) 40 MG tablet Take 40 mg by mouth daily.     No current facility-administered medications for this visit.    ALLERGIES:  Allergies  Allergen Reactions   Penicillins Rash    Also swelling reaction     PHYSICAL EXAM:  Performance status (ECOG): 1 - Symptomatic but completely ambulatory  There were no vitals filed for this visit. Wt Readings from Last 3 Encounters:  11/07/20 188 lb 11.4 oz (85.6 kg)  10/07/20 190 lb 3.2 oz (86.3 kg)  09/20/20 191 lb 8 oz (86.9 kg)   Physical Exam Vitals reviewed.  Constitutional:      Appearance: Normal appearance.  Cardiovascular:     Rate and Rhythm: Normal rate and regular rhythm.     Pulses: Normal pulses.     Heart sounds: Normal heart sounds.  Pulmonary:     Effort: Pulmonary effort is normal.     Breath sounds: Normal breath sounds.  Neurological:     General: No focal deficit present.     Mental Status: He is alert and oriented to person, place, and time.  Psychiatric:        Mood and Affect: Mood normal.        Behavior: Behavior normal.     LABORATORY DATA:  I have reviewed the labs as listed.  CBC Latest Ref Rng & Units 02/07/2021 10/31/2020 09/20/2020  WBC 4.0 - 10.5 K/uL 9.1 8.8 8.7  Hemoglobin 13.0 - 17.0 g/dL 14.0 14.5 13.1  Hematocrit 39.0 - 52.0 % 40.0  40.5 37.9(L)  Platelets 150 - 400 K/uL 245 243 271   CMP Latest Ref Rng & Units 10/31/2020 09/13/2020 06/10/2020  Glucose 70 - 99 mg/dL 171(H) 307(H) 145(H)  BUN 8 - 23 mg/dL 25(H) 21 19  Creatinine 0.61 - 1.24 mg/dL 1.29(H) 1.29(H) 1.15  Sodium 135 - 145 mmol/L 133(L) 132(L) 135  Potassium 3.5 - 5.1 mmol/L 3.4(L) 4.0 3.3(L)  Chloride 98 - 111 mmol/L 98 99 100  CO2 22 - 32 mmol/L 27 22 24  Calcium 8.9 - 10.3 mg/dL 8.8(L) 8.6(L) 8.6(L)  Total Protein 6.5 - 8.1 g/dL 10.0(H) 9.1(H) 9.1(H)  Total Bilirubin 0.3 - 1.2 mg/dL 1.0 1.3(H) 0.9  Alkaline Phos 38 - 126 U/L 77 76 75  AST 15 - 41 U/L 14(L) 22 14(L)  ALT 0 - 44 U/L _0 DIAGNOSTIC IMAGING:  I have independently reviewed the scans and discussed with the patient. NM Bone Scan Whole Body  Result Date: 02/08/2021 CLINICAL DATA:  Prostate carcinoma, myeloma EXAM: NUCLEAR MEDICINE WHOLE BODY BONE SCAN TECHNIQUE: Whole body anterior and posterior images were obtained approximately 3 hours after intravenous injection of radiopharmaceutical. RADIOPHARMACEUTICALS:  Twenty-two mCi Technetium-23mMDP IV COMPARISON:  None FINDINGS: No significant abnormal foci of uptake are noted. There is subtle increased activity in both knees, possibly due to degenerative arthritis. Activity in the kidneys and urinary bladder is unremarkable. IMPRESSION: No focal abnormality is seen in the radionuclide bone scan. Electronically Signed   By: PElmer PickerM.D.   On: 02/08/2021 16:08     ASSESSMENT:  IgG kappa smoldering multiple myeloma: - Skeletal survey in February 2022 was negative. - Bone marrow biopsy on 06/06/2020 with 20% plasma cells.  Myeloma FISH panel with monosomy 13/deletion 13.  Chromosome analysis 46, XY (20). - No "crab" features.   2.  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 - No major chemical exposures.  He had personal history of melanoma of the upper lip. - Father had acute  myeloid leukemia.  Mother had multiple myeloma.  Son died of acute lymphocytic leukemia   PLAN:  1.  IgG kappa smoldering multiple myeloma: - Reviewed myeloma labs from 02/07/2021.  M spike is stable at 3.4 g. - CBC shows hemoglobin is stable at 14.  Free light chain ratio is 29.75, slightly up from 26.7.  Kappa light chains at 336. - CMP shows creatinine 1.14 and calcium 8.8. - No "crab" features.  2.  Peripheral neuropathy: - He has neuropathy in the feet for 10 years.  He also has diabetes.  He has burning pain. - Continue gabapentin 300 mg 4 times daily.    Orders placed this encounter:  No orders of the defined types were placed in this encounter.    SDerek Jack MD [phone]  I, KVista Lawman am acting as a scribe for Dr. SDerek Jack  I, SDerek JackMD, have reviewed the above documentation for accuracy and completeness, and I agree with the above.

## 2021-02-14 ENCOUNTER — Inpatient Hospital Stay (HOSPITAL_COMMUNITY): Payer: Medicare Other | Admitting: Hematology

## 2021-02-14 ENCOUNTER — Other Ambulatory Visit: Payer: Self-pay

## 2021-02-14 DIAGNOSIS — D472 Monoclonal gammopathy: Secondary | ICD-10-CM

## 2021-02-14 DIAGNOSIS — E1142 Type 2 diabetes mellitus with diabetic polyneuropathy: Secondary | ICD-10-CM | POA: Diagnosis not present

## 2021-02-14 DIAGNOSIS — Z7984 Long term (current) use of oral hypoglycemic drugs: Secondary | ICD-10-CM | POA: Diagnosis not present

## 2021-02-14 DIAGNOSIS — C9000 Multiple myeloma not having achieved remission: Secondary | ICD-10-CM | POA: Diagnosis not present

## 2021-02-14 LAB — COMPREHENSIVE METABOLIC PANEL
ALT: 13 U/L (ref 0–44)
AST: 16 U/L (ref 15–41)
Albumin: 3.3 g/dL — ABNORMAL LOW (ref 3.5–5.0)
Alkaline Phosphatase: 69 U/L (ref 38–126)
Anion gap: 10 (ref 5–15)
BUN: 21 mg/dL (ref 8–23)
CO2: 23 mmol/L (ref 22–32)
Calcium: 8.8 mg/dL — ABNORMAL LOW (ref 8.9–10.3)
Chloride: 100 mmol/L (ref 98–111)
Creatinine, Ser: 1.14 mg/dL (ref 0.61–1.24)
GFR, Estimated: >60 mL/min (ref >60)
Glucose, Bld: 200 mg/dL — ABNORMAL HIGH (ref 70–99)
Potassium: 3.2 mmol/L — ABNORMAL LOW (ref 3.5–5.1)
Sodium: 133 mmol/L — ABNORMAL LOW (ref 135–145)
Total Bilirubin: 0.8 mg/dL (ref 0.3–1.2)
Total Protein: 9.6 g/dL — ABNORMAL HIGH (ref 6.5–8.1)

## 2021-02-14 NOTE — Patient Instructions (Signed)
[place] at [facility] Discharge Instructions  You were seen and examined today by Dr. Delton Coombes. He reviewed your most recent labs and everything looks okay. Please keep follow up as scheduled.   Thank you for choosing [place] at [facility] to provide your oncology and hematology care.  To afford each patient quality time with our provider, please arrive at least 15 minutes before your scheduled appointment time.   If you have a lab appointment with the [facility] please come in thru the Main Entrance and check in at the main information desk.  You need to re-schedule your appointment should you arrive 10 or more minutes late.  We strive to give you quality time with our providers, and arriving late affects you and other patients whose appointments are after yours.  Also, if you no show three or more times for appointments you may be dismissed from the clinic at the providers discretion.     Again, thank you for choosing [facility].  Our hope is that these requests will decrease the amount of time that you wait before being seen by our physicians.       _____________________________________________________________  Should you have questions after your visit to [facility], please contact our office at [phone] and follow the prompts.  Our office hours are 8:00 a.m. and 4:30 p.m. Monday - Friday.  Please note that voicemails left after 4:00 p.m. may not be returned until the following business day.  We are closed weekends and major holidays.  You do have access to a nurse 24-7, just call the main number to the clinic [phone] and do not press any options, hold on the line and a nurse will answer the phone.    For prescription refill requests, have your pharmacy contact our office and allow 72 hours.    Due to Covid, you will need to wear a mask upon entering the hospital. If you do not have a mask, a mask  will be given to you at the Main Entrance upon arrival. For doctor visits, patients may have 1 support person age 7 or older with them. For treatment visits, patients can not have anyone with them due to social distancing guidelines and our immunocompromised population.

## 2021-02-15 DIAGNOSIS — I10 Essential (primary) hypertension: Secondary | ICD-10-CM | POA: Diagnosis not present

## 2021-02-15 DIAGNOSIS — C9000 Multiple myeloma not having achieved remission: Secondary | ICD-10-CM | POA: Diagnosis not present

## 2021-02-15 DIAGNOSIS — M791 Myalgia, unspecified site: Secondary | ICD-10-CM | POA: Diagnosis not present

## 2021-02-15 DIAGNOSIS — Z23 Encounter for immunization: Secondary | ICD-10-CM | POA: Diagnosis not present

## 2021-02-15 DIAGNOSIS — G3184 Mild cognitive impairment, so stated: Secondary | ICD-10-CM | POA: Diagnosis not present

## 2021-02-15 DIAGNOSIS — C439 Malignant melanoma of skin, unspecified: Secondary | ICD-10-CM | POA: Diagnosis not present

## 2021-02-15 DIAGNOSIS — K219 Gastro-esophageal reflux disease without esophagitis: Secondary | ICD-10-CM | POA: Diagnosis not present

## 2021-02-15 DIAGNOSIS — E782 Mixed hyperlipidemia: Secondary | ICD-10-CM | POA: Diagnosis not present

## 2021-02-15 DIAGNOSIS — G720 Drug-induced myopathy: Secondary | ICD-10-CM | POA: Diagnosis not present

## 2021-02-15 DIAGNOSIS — E1140 Type 2 diabetes mellitus with diabetic neuropathy, unspecified: Secondary | ICD-10-CM | POA: Diagnosis not present

## 2021-02-15 DIAGNOSIS — Z0001 Encounter for general adult medical examination with abnormal findings: Secondary | ICD-10-CM | POA: Diagnosis not present

## 2021-02-15 DIAGNOSIS — N182 Chronic kidney disease, stage 2 (mild): Secondary | ICD-10-CM | POA: Diagnosis not present

## 2021-06-12 DIAGNOSIS — E1140 Type 2 diabetes mellitus with diabetic neuropathy, unspecified: Secondary | ICD-10-CM | POA: Diagnosis not present

## 2021-06-12 DIAGNOSIS — E782 Mixed hyperlipidemia: Secondary | ICD-10-CM | POA: Diagnosis not present

## 2021-06-13 ENCOUNTER — Inpatient Hospital Stay (HOSPITAL_COMMUNITY): Payer: Medicare Other | Attending: Hematology

## 2021-06-13 ENCOUNTER — Ambulatory Visit (HOSPITAL_COMMUNITY)
Admission: RE | Admit: 2021-06-13 | Discharge: 2021-06-13 | Disposition: A | Discharge status: Home / Self Care | Payer: Medicare Other | Source: Ambulatory Visit | Attending: Hematology | Admitting: Hematology

## 2021-06-13 ENCOUNTER — Other Ambulatory Visit: Payer: Self-pay

## 2021-06-13 DIAGNOSIS — G629 Polyneuropathy, unspecified: Secondary | ICD-10-CM | POA: Insufficient documentation

## 2021-06-13 DIAGNOSIS — D472 Monoclonal gammopathy: Secondary | ICD-10-CM | POA: Insufficient documentation

## 2021-06-13 DIAGNOSIS — Z79899 Other long term (current) drug therapy: Secondary | ICD-10-CM | POA: Insufficient documentation

## 2021-06-13 DIAGNOSIS — C9000 Multiple myeloma not having achieved remission: Secondary | ICD-10-CM | POA: Diagnosis not present

## 2021-06-13 LAB — CBC WITH DIFFERENTIAL/PLATELET
Abs Immature Granulocytes: 0.03 10*3/uL (ref 0.00–0.07)
Basophils Absolute: 0.1 10*3/uL (ref 0.0–0.1)
Basophils Relative: 1 %
Eosinophils Absolute: 0.8 10*3/uL — ABNORMAL HIGH (ref 0.0–0.5)
Eosinophils Relative: 9 %
HCT: 39.8 % (ref 39.0–52.0)
Hemoglobin: 13.9 g/dL (ref 13.0–17.0)
Immature Granulocytes: 0 %
Lymphocytes Relative: 26 %
Lymphs Abs: 2.4 10*3/uL (ref 0.7–4.0)
MCH: 34.8 pg — ABNORMAL HIGH (ref 26.0–34.0)
MCHC: 34.9 g/dL (ref 30.0–36.0)
MCV: 99.5 fL (ref 80.0–100.0)
Monocytes Absolute: 0.9 10*3/uL (ref 0.1–1.0)
Monocytes Relative: 9 %
Neutro Abs: 5.4 10*3/uL (ref 1.7–7.7)
Neutrophils Relative %: 55 %
Platelets: 270 10*3/uL (ref 150–400)
RBC: 4 MIL/uL — ABNORMAL LOW (ref 4.22–5.81)
RDW: 15 % (ref 11.5–15.5)
WBC: 9.6 10*3/uL (ref 4.0–10.5)
nRBC: 0 % (ref 0.0–0.2)

## 2021-06-13 LAB — COMPREHENSIVE METABOLIC PANEL
ALT: 13 U/L (ref 0–44)
AST: 14 U/L — ABNORMAL LOW (ref 15–41)
Albumin: 3.3 g/dL — ABNORMAL LOW (ref 3.5–5.0)
Alkaline Phosphatase: 74 U/L (ref 38–126)
Anion gap: 10 (ref 5–15)
BUN: 19 mg/dL (ref 8–23)
CO2: 25 mmol/L (ref 22–32)
Calcium: 8.9 mg/dL (ref 8.9–10.3)
Chloride: 97 mmol/L — ABNORMAL LOW (ref 98–111)
Creatinine, Ser: 1.2 mg/dL (ref 0.61–1.24)
GFR, Estimated: >60 mL/min (ref >60)
Glucose, Bld: 146 mg/dL — ABNORMAL HIGH (ref 70–99)
Potassium: 3.6 mmol/L (ref 3.5–5.1)
Sodium: 132 mmol/L — ABNORMAL LOW (ref 135–145)
Total Bilirubin: 0.9 mg/dL (ref 0.3–1.2)
Total Protein: 10.4 g/dL — ABNORMAL HIGH (ref 6.5–8.1)

## 2021-06-13 LAB — LACTATE DEHYDROGENASE: LDH: 121 U/L (ref 98–192)

## 2021-06-14 LAB — PROTEIN ELECTROPHORESIS, SERUM
A/G Ratio: 0.6 — ABNORMAL LOW (ref 0.7–1.7)
Albumin ELP: 4 g/dL (ref 2.9–4.4)
Alpha-1-Globulin: 0.3 g/dL (ref 0.0–0.4)
Alpha-2-Globulin: 0.8 g/dL (ref 0.4–1.0)
Beta Globulin: 0.9 g/dL (ref 0.7–1.3)
Gamma Globulin: 4.3 g/dL — ABNORMAL HIGH (ref 0.4–1.8)
Globulin, Total: 6.3 g/dL — ABNORMAL HIGH (ref 2.2–3.9)
M-Spike, %: 3.8 g/dL — ABNORMAL HIGH
Total Protein ELP: 10.3 g/dL — ABNORMAL HIGH (ref 6.0–8.5)

## 2021-06-14 LAB — KAPPA/LAMBDA LIGHT CHAINS
Kappa free light chain: 426.2 mg/L — ABNORMAL HIGH (ref 3.3–19.4)
Kappa, lambda light chain ratio: 38.4 — ABNORMAL HIGH (ref 0.26–1.65)
Lambda free light chains: 11.1 mg/L (ref 5.7–26.3)

## 2021-06-15 DIAGNOSIS — L84 Corns and callosities: Secondary | ICD-10-CM | POA: Diagnosis not present

## 2021-06-15 DIAGNOSIS — B351 Tinea unguium: Secondary | ICD-10-CM | POA: Diagnosis not present

## 2021-06-15 DIAGNOSIS — E1142 Type 2 diabetes mellitus with diabetic polyneuropathy: Secondary | ICD-10-CM | POA: Diagnosis not present

## 2021-06-15 DIAGNOSIS — M79676 Pain in unspecified toe(s): Secondary | ICD-10-CM | POA: Diagnosis not present

## 2021-06-16 DIAGNOSIS — G720 Drug-induced myopathy: Secondary | ICD-10-CM | POA: Diagnosis not present

## 2021-06-16 DIAGNOSIS — G3184 Mild cognitive impairment, so stated: Secondary | ICD-10-CM | POA: Diagnosis not present

## 2021-06-16 DIAGNOSIS — N182 Chronic kidney disease, stage 2 (mild): Secondary | ICD-10-CM | POA: Diagnosis not present

## 2021-06-16 DIAGNOSIS — I10 Essential (primary) hypertension: Secondary | ICD-10-CM | POA: Diagnosis not present

## 2021-06-16 DIAGNOSIS — M791 Myalgia, unspecified site: Secondary | ICD-10-CM | POA: Diagnosis not present

## 2021-06-16 DIAGNOSIS — E782 Mixed hyperlipidemia: Secondary | ICD-10-CM | POA: Diagnosis not present

## 2021-06-16 DIAGNOSIS — E1140 Type 2 diabetes mellitus with diabetic neuropathy, unspecified: Secondary | ICD-10-CM | POA: Diagnosis not present

## 2021-06-16 DIAGNOSIS — C439 Malignant melanoma of skin, unspecified: Secondary | ICD-10-CM | POA: Diagnosis not present

## 2021-06-16 DIAGNOSIS — K219 Gastro-esophageal reflux disease without esophagitis: Secondary | ICD-10-CM | POA: Diagnosis not present

## 2021-06-16 DIAGNOSIS — C9000 Multiple myeloma not having achieved remission: Secondary | ICD-10-CM | POA: Diagnosis not present

## 2021-06-20 ENCOUNTER — Inpatient Hospital Stay (HOSPITAL_BASED_OUTPATIENT_CLINIC_OR_DEPARTMENT_OTHER): Payer: Medicare Other | Admitting: Hematology

## 2021-06-20 ENCOUNTER — Other Ambulatory Visit: Payer: Self-pay

## 2021-06-20 VITALS — BP 130/79 | HR 73 | Temp 98.1°F | Resp 18 | Ht 68.0 in | Wt 190.1 lb

## 2021-06-20 DIAGNOSIS — G629 Polyneuropathy, unspecified: Secondary | ICD-10-CM | POA: Insufficient documentation

## 2021-06-20 DIAGNOSIS — D472 Monoclonal gammopathy: Secondary | ICD-10-CM | POA: Diagnosis not present

## 2021-06-20 DIAGNOSIS — Z79899 Other long term (current) drug therapy: Secondary | ICD-10-CM | POA: Diagnosis not present

## 2021-06-20 NOTE — Progress Notes (Addendum)
? ?[place] ?[address] ?[place], [place] [address] ? ? ?CLINIC:  ?Medical Oncology/Hematology ? ?PCP:  ?Celene Squibb, MD ?[address] [place] [address]  ?[phone] ? ?REASON FOR VISIT:  ?Follow-up for smoldering multiple myeloma ? ?PRIOR THERAPY: none ? ?CURRENT THERAPY: surveillance ? ?INTERVAL HISTORY:  ?[name] [name], a 76 y.o. male, returns for routine follow-up for his smoldering multiple myeloma. [name] [name] was last seen on 02/14/2021. ? ?Today he reports feeling good. He denies new pains and infections.  ? ?REVIEW OF SYSTEMS:  ?Review of Systems  ?Constitutional:  Negative for appetite change and fatigue.  ?Musculoskeletal:  Positive for arthralgias (6/10 feet and hands).  ?Neurological:  Positive for numbness.  ?Psychiatric/Behavioral:  Positive for sleep disturbance.   ?All other systems reviewed and are negative. ? ?PAST MEDICAL/SURGICAL HISTORY:  ?Past Medical History:  ?Diagnosis Date  ? Adverse effect of unspecified drugs, medicaments and biological substances, initial encounter   ? Essential (primary) hypertension   ? Hyperlipidemia, unspecified   ? Impotence of organic origin   ? Malignant melanoma of skin, unspecified ([place])   ? Malignant neoplasm of prostate ([place])   ? Multiple myeloma ([place])   ? Neuropathy   ? Non-pressure chronic ulcer of other part of unspecified foot with unspecified severity ([place])   ? Reflux esophagitis   ? Restless legs syndrome   ? Tinea unguium   ? Type 2 diabetes mellitus with diabetic neuropathy, unspecified ([place])   ? Type 2 diabetes mellitus with other specified complication ([place])   ? Unspecified atrial fibrillation ([place])   ? ?Past Surgical History:  ?Procedure Laterality Date  ? CATARACT EXTRACTION    ? HERNIA REPAIR    ? melanoma removal    ? PROSTATE SURGERY    ? ? ?SOCIAL HISTORY:  ?Social History  ? ?Socioeconomic History  ? Marital status: Married  ?  Spouse name: Not on file  ? Number of children: Not on file  ? Years of  education: Not on file  ? Highest education level: Not on file  ?Occupational History  ? Not on file  ?Tobacco Use  ? Smoking status: Never  ? Smokeless tobacco: Never  ?Substance and Sexual Activity  ? Alcohol use: No  ?  Alcohol/week: 0.0 standard drinks  ? Drug use: No  ? Sexual activity: Not Currently  ?Other Topics Concern  ? Not on file  ?Social History Narrative  ? Not on file  ? ?Social Determinants of Health  ? ?Financial Resource Strain: Not on file  ?Food Insecurity: Not on file  ?Transportation Needs: Not on file  ?Physical Activity: Not on file  ?Stress: Not on file  ?Social Connections: Not on file  ?Intimate Partner Violence: Not on file  ? ? ?FAMILY HISTORY:  ?No family history on file. ? ?CURRENT MEDICATIONS:  ?Current Outpatient Medications  ?Medication Sig Dispense Refill  ? atenolol-chlorthalidone (TENORETIC) 50-25 MG per tablet Take 1 tablet by mouth daily. Takes 1/2 daily per Dr Luan Pulling    ? azithromycin (ZITHROMAX) 250 MG tablet Take by mouth.    ? diclofenac sodium (VOLTAREN) 1 % GEL Apply 2 g topically daily as needed.    ? gabapentin (NEURONTIN) 300 MG capsule Take 300 mg by mouth every 4 (four) hours.    ? loperamide (IMODIUM A-D) 2 MG tablet Take 2 mg by mouth 4 (four) times daily as needed for diarrhea or loose stools.    ? losartan (COZAAR) 50 MG  tablet Take 50 mg by mouth daily.    ? metFORMIN (GLUCOPHAGE) 500 MG tablet Take 500 mg by mouth 2 (two) times daily with a meal.    ? pantoprazole (PROTONIX) 40 MG tablet Take 40 mg by mouth daily.    ? silver sulfADIAZINE (SILVADENE) 1 % cream Apply topically.    ? ?No current facility-administered medications for this visit.  ? ? ?ALLERGIES:  ?Allergies  ?Allergen Reactions  ? Penicillins Rash  ?  Also swelling reaction   ? ? ?PHYSICAL EXAM:  ?Performance status (ECOG): 1 - Symptomatic but completely ambulatory ? ?Vitals:  ? 06/20/21 1459  ?BP: 130/79  ?Pulse: 73  ?Resp: 18  ?Temp: 98.1 ?F (36.7 ?C)  ?SpO2: 98%  ? ?Wt Readings from Last 3  Encounters:  ?06/20/21 190 lb 1.6 oz (86.2 kg)  ?02/14/21 188 lb 1.6 oz (85.3 kg)  ?11/07/20 188 lb 11.4 oz (85.6 kg)  ? ?Physical Exam ?Vitals reviewed.  ?Constitutional:   ?   Appearance: Normal appearance.  ?Cardiovascular:  ?   Rate and Rhythm: Normal rate and regular rhythm.  ?   Pulses: Normal pulses.  ?   Heart sounds: Normal heart sounds.  ?Pulmonary:  ?   Effort: Pulmonary effort is normal.  ?   Breath sounds: Normal breath sounds.  ?Neurological:  ?   General: No focal deficit present.  ?   Mental Status: He is alert and oriented to person, place, and time.  ?Psychiatric:     ?   Mood and Affect: Mood normal.     ?   Behavior: Behavior normal.  ? ? ?LABORATORY DATA:  ?I have reviewed the labs as listed.  ?CBC Latest Ref Rng & Units 06/13/2021 02/07/2021 10/31/2020  ?WBC 4.0 - 10.5 K/uL 9.6 9.1 8.8  ?Hemoglobin 13.0 - 17.0 g/dL 13.9 14.0 14.5  ?Hematocrit 39.0 - 52.0 % 39.8 40.0 40.5  ?Platelets 150 - 400 K/uL 270 245 243  ? ?CMP Latest Ref Rng & Units 06/13/2021 02/14/2021 10/31/2020  ?Glucose 70 - 99 mg/dL 146(H) 200(H) 171(H)  ?BUN 8 - 23 mg/dL 19 21 25(H)  ?Creatinine 0.61 - 1.24 mg/dL 1.20 1.14 1.29(H)  ?Sodium 135 - 145 mmol/L 132(L) 133(L) 133(L)  ?Potassium 3.5 - 5.1 mmol/L 3.6 3.2(L) 3.4(L)  ?Chloride 98 - 111 mmol/L 97(L) 100 98  ?CO2 22 - 32 mmol/L _0 ?Calcium 8.9 - 10.3 mg/dL 8.9 8.8(L) 8.8(L)  ?Total Protein 6.5 - 8.1 g/dL 10.4(H) 9.6(H) 10.0(H)  ?Total Bilirubin 0.3 - 1.2 mg/dL 0.9 0.8 1.0  ?Alkaline Phos 38 - 126 U/L 74 69 77  ?AST 15 - 41 U/L 14(L) 16 14(L)  ?ALT 0 - 44 U/L _1 ? ?   ?Component Value Date/Time  ? RBC 4.00 (L) 06/13/2021 1314  ? MCV 99.5 06/13/2021 1314  ? MCH 34.8 (H) 06/13/2021 1314  ? MCHC 34.9 06/13/2021 1314  ? RDW 15.0 06/13/2021 1314  ? LYMPHSABS 2.4 06/13/2021 1314  ? MONOABS 0.9 06/13/2021 1314  ? EOSABS 0.8 (H) 06/13/2021 1314  ? BASOSABS 0.1 06/13/2021 1314  ? ? ?DIAGNOSTIC IMAGING:  ?I have independently reviewed the scans and discussed with the patient. ?DG  Bone Survey Met ? ?Result Date: 06/15/2021 ?CLINICAL DATA:  Small ring multiple myeloma EXAM: METASTATIC BONE SURVEY COMPARISON:  05/18/2020 FINDINGS: No suspicious focal lytic lesion. Degenerative changes in the thoracolumbar spine. No acute bony abnormality. No fracture, subluxation or dislocation. Heart and mediastinal contours are within normal limits. No  focal opacities or effusions. No acute bony abnormality. IMPRESSION: No acute findings. Electronically Signed   By: Rolm Baptise M.D.   On: 06/15/2021 01:14    ? ?ASSESSMENT:  ?IgG kappa smoldering multiple myeloma: ?- Skeletal survey in February 2022 was negative. ?- Bone marrow biopsy on 06/06/2020 with 20% plasma cells.  Myeloma FISH panel with monosomy 13/deletion 13.  Chromosome analysis 46, XY (20). ?- No "crab" features. ?  ?2.  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 ?- No major chemical exposures.  He had personal history of melanoma of the upper lip. ?- Father had acute myeloid leukemia.  Mother had multiple myeloma.  Son died of acute lymphocytic leukemia ? ? ?PLAN:  ?1.  IgG kappa smoldering multiple myeloma: ?- We reviewed myeloma labs from 06/13/2021.  M spike continue to increase.  It is 3.8 g, up from 3.4 g previously.  Creatinine was 1.2, calcium normal.  Hemoglobin was normal.  Kappa light chains increased to 426 from 336.  Ratio increased to 38 from 29. ?- Skeletal survey from 06/13/2021 was negative for lytic lesions. ?- Because of worsening M spike and free light chain ratio, recommend whole-body PET CT scan.  RTC after PET scan. ?- We also discussed treatment options if PET scan is positive for myeloma. ? ?2.  Peripheral neuropathy: ?- Continue gabapentin 300 mg 4 times daily. ? ?Orders placed this encounter:  ?Orders Placed This Encounter  ?Procedures  ? NM PET Image Initial (PI) Whole Body  ? ? ? ?Derek Jack, MD ?[place] ?[phone] ? ? ?I, Thana Ates,  am acting as a scribe for Dr. Derek Jack. ? ?I, Derek Jack MD, have reviewed the above documentation for accuracy and completeness, and I agree with the above. ?  ? ? ?

## 2021-06-20 NOTE — Patient Instructions (Signed)
[place] at [facility] ?Discharge Instructions ? ?You were seen and examined today by Dr. Delton Coombes. He reviewed your most recent labs and your abnormal protein levels are going up. Dr. Delton Coombes recommends having a whole body PET scan to see if treatment is needed. Please keep follow up appointments as scheduled. ? ? ?Thank you for choosing [place] at [facility] to provide your oncology and hematology care.  To afford each patient quality time with our provider, please arrive at least 15 minutes before your scheduled appointment time.  ? ?If you have a lab appointment with the [facility] please come in thru the Main Entrance and check in at the main information desk. ? ?You need to re-schedule your appointment should you arrive 10 or more minutes late.  We strive to give you quality time with our providers, and arriving late affects you and other patients whose appointments are after yours.  Also, if you no show three or more times for appointments you may be dismissed from the clinic at the providers discretion.     ?Again, thank you for choosing [facility].  Our hope is that these requests will decrease the amount of time that you wait before being seen by our physicians.       ?_____________________________________________________________ ? ?Should you have questions after your visit to [facility], please contact our office at [phone] and follow the prompts.  Our office hours are 8:00 a.m. and 4:30 p.m. Monday - Friday.  Please note that voicemails left after 4:00 p.m. may not be returned until the following business day.  We are closed weekends and major holidays.  You do have access to a nurse 24-7, just call the main number to the clinic [phone] and do not press any options, hold on the line and a nurse will answer the phone.   ? ?For prescription refill requests, have your pharmacy contact our office and allow  72 hours.   ? ?Due to Covid, you will need to wear a mask upon entering the hospital. If you do not have a mask, a mask will be given to you at the Main Entrance upon arrival. For doctor visits, patients may have 1 support person age 77 or older with them. For treatment visits, patients can not have anyone with them due to social distancing guidelines and our immunocompromised population.  ? ?  ?

## 2021-06-29 ENCOUNTER — Encounter (HOSPITAL_COMMUNITY)
Admission: RE | Admit: 2021-06-29 | Discharge: 2021-06-29 | Disposition: A | Discharge status: Home / Self Care | Payer: Medicare Other | Source: Ambulatory Visit | Attending: Hematology | Admitting: Hematology

## 2021-06-29 DIAGNOSIS — D472 Monoclonal gammopathy: Secondary | ICD-10-CM | POA: Insufficient documentation

## 2021-06-29 DIAGNOSIS — C8590 Non-Hodgkin lymphoma, unspecified, unspecified site: Secondary | ICD-10-CM | POA: Diagnosis not present

## 2021-06-29 MED ORDER — FLUDEOXYGLUCOSE F - 18 (FDG) INJECTION
10.3700 | Freq: Once | INTRAVENOUS | Status: AC | PRN
  Administered 2021-06-29: 10.37 via INTRAVENOUS

## 2021-07-05 ENCOUNTER — Inpatient Hospital Stay (HOSPITAL_COMMUNITY): Payer: Medicare Other | Attending: Hematology | Admitting: Hematology

## 2021-07-05 VITALS — BP 131/78 | HR 81 | Temp 96.8°F | Resp 18 | Ht 67.72 in | Wt 186.7 lb

## 2021-07-05 DIAGNOSIS — D472 Monoclonal gammopathy: Secondary | ICD-10-CM | POA: Diagnosis not present

## 2021-07-05 DIAGNOSIS — G629 Polyneuropathy, unspecified: Secondary | ICD-10-CM | POA: Diagnosis not present

## 2021-07-05 DIAGNOSIS — Z79899 Other long term (current) drug therapy: Secondary | ICD-10-CM | POA: Diagnosis not present

## 2021-07-05 NOTE — Patient Instructions (Signed)
[place] at [facility] ?Discharge Instructions ? ? ?You were seen and examined today by Dr. Delton Coombes. ? ?He reviewed the results of you PET scan which is completely normal.  ? ?Return as scheduled in 3 months.  ? ? ?Thank you for choosing [place] at [facility] to provide your oncology and hematology care.  To afford each patient quality time with our provider, please arrive at least 15 minutes before your scheduled appointment time.  ? ?If you have a lab appointment with the [facility] please come in thru the Main Entrance and check in at the main information desk. ? ?You need to re-schedule your appointment should you arrive 10 or more minutes late.  We strive to give you quality time with our providers, and arriving late affects you and other patients whose appointments are after yours.  Also, if you no show three or more times for appointments you may be dismissed from the clinic at the providers discretion.     ?Again, thank you for choosing [facility].  Our hope is that these requests will decrease the amount of time that you wait before being seen by our physicians.       ?_____________________________________________________________ ? ?Should you have questions after your visit to [facility], please contact our office at [phone] and follow the prompts.  Our office hours are 8:00 a.m. and 4:30 p.m. Monday - Friday.  Please note that voicemails left after 4:00 p.m. may not be returned until the following business day.  We are closed weekends and major holidays.  You do have access to a nurse 24-7, just call the main number to the clinic [phone] and do not press any options, hold on the line and a nurse will answer the phone.   ? ?For prescription refill requests, have your pharmacy contact our office and allow 72 hours.   ? ?Due to Covid, you will need to wear a mask upon entering the hospital. If you do not have a  mask, a mask will be given to you at the Main Entrance upon arrival. For doctor visits, patients may have 1 support person age 53 or older with them. For treatment visits, patients can not have anyone with them due to social distancing guidelines and our immunocompromised population.  ? ?   ?

## 2021-07-05 NOTE — Progress Notes (Signed)
? ?[place] ?[address] ?[place], [place] [address] ? ? ?CLINIC:  ?Medical Oncology/Hematology ? ?PCP:  ?Celene Squibb, MD ?[address] [place] [address] ?[phone] ? ? ?REASON FOR VISIT:  ?Follow-up for smoldering multiple myeloma ? ?PRIOR THERAPY: none ? ?CURRENT THERAPY: surveillance ? ?BRIEF ONCOLOGIC HISTORY:  ?Oncology History  ? No history exists.  ? ? ?CANCER STAGING: ? Cancer Staging  ?No matching staging information was found for the patient. ? ?INTERVAL HISTORY:  ?[name], a 76 y.o. male, returns for routine follow-up of his smoldering multiple myeloma. [name] was last seen on 06/20/2021.  ? ?Today he reports feeling well. He denies new pains.  ? ?REVIEW OF SYSTEMS:  ?Review of Systems  ?Constitutional:  Positive for fatigue. Negative for appetite change.  ?Neurological:  Positive for numbness (feet).  ?Psychiatric/Behavioral:  Positive for sleep disturbance (d/t foot pain).   ?All other systems reviewed and are negative. ? ?PAST MEDICAL/SURGICAL HISTORY:  ?Past Medical History:  ?Diagnosis Date  ? Adverse effect of unspecified drugs, medicaments and biological substances, initial encounter   ? Essential (primary) hypertension   ? Hyperlipidemia, unspecified   ? Impotence of organic origin   ? Malignant melanoma of skin, unspecified ([place])   ? Malignant neoplasm of prostate ([place])   ? Multiple myeloma ([place])   ? Neuropathy   ? Non-pressure chronic ulcer of other part of unspecified foot with unspecified severity ([place])   ? Reflux esophagitis   ? Restless legs syndrome   ? Tinea unguium   ? Type 2 diabetes mellitus with diabetic neuropathy, unspecified ([place])   ? Type 2 diabetes mellitus with other specified complication ([place])   ? Unspecified atrial fibrillation ([place])   ? ?Past Surgical History:  ?Procedure Laterality Date  ? CATARACT EXTRACTION    ? HERNIA REPAIR    ? melanoma removal    ? PROSTATE SURGERY    ? ? ?SOCIAL HISTORY:  ?Social History  ? ?Socioeconomic  History  ? Marital status: Married  ?  Spouse name: Not on file  ? Number of children: Not on file  ? Years of education: Not on file  ? Highest education level: Not on file  ?Occupational History  ? Not on file  ?Tobacco Use  ? Smoking status: Never  ? Smokeless tobacco: Never  ?Substance and Sexual Activity  ? Alcohol use: No  ?  Alcohol/week: 0.0 standard drinks  ? Drug use: No  ? Sexual activity: Not Currently  ?Other Topics Concern  ? Not on file  ?Social History Narrative  ? Not on file  ? ?Social Determinants of Health  ? ?Financial Resource Strain: Not on file  ?Food Insecurity: Not on file  ?Transportation Needs: Not on file  ?Physical Activity: Not on file  ?Stress: Not on file  ?Social Connections: Not on file  ?Intimate Partner Violence: Not on file  ? ? ?FAMILY HISTORY:  ?No family history on file. ? ?CURRENT MEDICATIONS:  ?Current Outpatient Medications  ?Medication Sig Dispense Refill  ? atenolol-chlorthalidone (TENORETIC) 50-25 MG per tablet Take 1 tablet by mouth daily. Takes 1/2 daily per Dr Luan Pulling    ? azithromycin (ZITHROMAX) 250 MG tablet Take by mouth.    ? diclofenac sodium (VOLTAREN) 1 % GEL Apply 2 g topically daily as needed.    ? gabapentin (NEURONTIN) 300 MG capsule Take 300 mg by mouth every 4 (four) hours.    ? loperamide (IMODIUM A-D) 2 MG tablet  Take 2 mg by mouth 4 (four) times daily as needed for diarrhea or loose stools.    ? losartan (COZAAR) 50 MG tablet Take 50 mg by mouth daily.    ? metFORMIN (GLUCOPHAGE) 500 MG tablet Take 500 mg by mouth 2 (two) times daily with a meal.    ? pantoprazole (PROTONIX) 40 MG tablet Take 40 mg by mouth daily.    ? silver sulfADIAZINE (SILVADENE) 1 % cream Apply topically.    ? ?No current facility-administered medications for this visit.  ? ? ?ALLERGIES:  ?Allergies  ?Allergen Reactions  ? Penicillins Rash  ?  Also swelling reaction   ? ? ?PHYSICAL EXAM:  ?Performance status (ECOG): 1 - Symptomatic but completely ambulatory ? ?Vitals:  ?  07/05/21 1133  ?BP: 131/78  ?Pulse: 81  ?Resp: 18  ?Temp: (!) 96.8 ?F (36 ?C)  ?SpO2: 97%  ? ?Wt Readings from Last 3 Encounters:  ?07/05/21 186 lb 11.2 oz (84.7 kg)  ?06/20/21 190 lb 1.6 oz (86.2 kg)  ?02/14/21 188 lb 1.6 oz (85.3 kg)  ? ?Physical Exam ?Vitals reviewed.  ?Constitutional:   ?   Appearance: Normal appearance.  ?Cardiovascular:  ?   Rate and Rhythm: Normal rate and regular rhythm.  ?   Pulses: Normal pulses.  ?   Heart sounds: Normal heart sounds.  ?Pulmonary:  ?   Effort: Pulmonary effort is normal.  ?   Breath sounds: Normal breath sounds.  ?Neurological:  ?   General: No focal deficit present.  ?   Mental Status: He is alert and oriented to person, place, and time.  ?Psychiatric:     ?   Mood and Affect: Mood normal.     ?   Behavior: Behavior normal.  ?  ? ?LABORATORY DATA:  ?I have reviewed the labs as listed.  ? ?  Latest Ref Rng & Units 06/13/2021  ?  1:14 PM 02/07/2021  ?  8:48 AM 10/31/2020  ? 10:39 AM  ?CBC  ?WBC 4.0 - 10.5 K/uL 9.6   9.1   8.8    ?Hemoglobin 13.0 - 17.0 g/dL 13.9   14.0   14.5    ?Hematocrit 39.0 - 52.0 % 39.8   40.0   40.5    ?Platelets 150 - 400 K/uL 270   245   243    ? ? ?  Latest Ref Rng & Units 06/13/2021  ?  1:14 PM 02/14/2021  ?  2:25 PM 10/31/2020  ? 10:39 AM  ?CMP  ?Glucose 70 - 99 mg/dL 146   200   171    ?BUN 8 - 23 mg/dL _0 ?Creatinine 0.61 - 1.24 mg/dL 1.20   1.14   1.29    ?Sodium 135 - 145 mmol/L 132   133   133    ?Potassium 3.5 - 5.1 mmol/L 3.6   3.2   3.4    ?Chloride 98 - 111 mmol/L 97   100   98    ?CO2 22 - 32 mmol/L _1 ?Calcium 8.9 - 10.3 mg/dL 8.9   8.8   8.8    ?Total Protein 6.5 - 8.1 g/dL 10.4   9.6   10.0    ?Total Bilirubin 0.3 - 1.2 mg/dL 0.9   0.8   1.0    ?Alkaline Phos 38 - 126 U/L 74   69   77    ?  AST 15 - 41 U/L _0 ?ALT 0 - 44 U/L _1 ? ? ?DIAGNOSTIC IMAGING:  ?I have independently reviewed the scans and discussed with the patient. ?NM PET Image Initial (PI) Whole Body ? ?Result Date:  06/30/2021 ?CLINICAL DATA:  Initial treatment strategy for lymphoma. EXAM: NUCLEAR MEDICINE PET WHOLE BODY TECHNIQUE: 10.37 mCi F-18 FDG was injected intravenously. Full-ring PET imaging was performed from the head to foot after the radiotracer. CT data was obtained and used for attenuation correction and anatomic localization. Fasting blood glucose: 129 mg/dl COMPARISON:  Bone scan 02/07/2021 and metastatic bone survey 06/13/2021. FINDINGS: Mediastinal blood pool activity: SUV max 2.50 HEAD/NECK: No hypermetabolic activity in the scalp. No hypermetabolic cervical lymph nodes. Incidental CT findings: Bilateral carotid artery calcifications noted. CHEST: No hypermetabolic mediastinal or hilar nodes. No suspicious pulmonary nodules on the CT scan. Incidental CT findings: Aortic and coronary artery calcifications are noted. Borderline cardiac enlargement. ABDOMEN/PELVIS: No abnormal hypermetabolic activity within the liver, pancreas, adrenal glands, or spleen. No hypermetabolic lymph nodes in the abdomen or pelvis. Incidental CT findings: Moderate atherosclerotic calcifications involving the aorta and branch vessels but no aneurysm. Surgical changes from a prostatectomy. No pelvic adenopathy. SKELETON: No focal hypermetabolic activity to suggest skeletal metastasis or active myeloma. Incidental CT findings: No lytic myelomatous lesions are identified. Age related degenerative changes involving the spine. EXTREMITIES: No abnormal hypermetabolic activity in the lower extremities. Incidental CT findings: none IMPRESSION: No PET-CT findings for metabolically active bone lesions to suggest active myeloma. Electronically Signed   By: Marijo Sanes M.D.   On: 06/30/2021 14:49  ? ?DG Bone Survey Met ? ?Result Date: 06/15/2021 ?CLINICAL DATA:  Small ring multiple myeloma EXAM: METASTATIC BONE SURVEY COMPARISON:  05/18/2020 FINDINGS: No suspicious focal lytic lesion. Degenerative changes in the thoracolumbar spine. No acute bony  abnormality. No fracture, subluxation or dislocation. Heart and mediastinal contours are within normal limits. No focal opacities or effusions. No acute bony abnormality. IMPRESSION: No acute findings. Electronically Si

## 2021-07-18 DIAGNOSIS — L97511 Non-pressure chronic ulcer of other part of right foot limited to breakdown of skin: Secondary | ICD-10-CM | POA: Diagnosis not present

## 2021-08-03 DIAGNOSIS — L97511 Non-pressure chronic ulcer of other part of right foot limited to breakdown of skin: Secondary | ICD-10-CM | POA: Diagnosis not present

## 2021-08-15 DIAGNOSIS — L97521 Non-pressure chronic ulcer of other part of left foot limited to breakdown of skin: Secondary | ICD-10-CM | POA: Diagnosis not present

## 2021-08-24 DIAGNOSIS — L97521 Non-pressure chronic ulcer of other part of left foot limited to breakdown of skin: Secondary | ICD-10-CM | POA: Diagnosis not present

## 2021-09-07 DIAGNOSIS — L97511 Non-pressure chronic ulcer of other part of right foot limited to breakdown of skin: Secondary | ICD-10-CM | POA: Diagnosis not present

## 2021-10-04 ENCOUNTER — Inpatient Hospital Stay (HOSPITAL_COMMUNITY): Payer: Medicare Other | Attending: Hematology

## 2021-10-04 DIAGNOSIS — Z79899 Other long term (current) drug therapy: Secondary | ICD-10-CM | POA: Diagnosis not present

## 2021-10-04 DIAGNOSIS — D472 Monoclonal gammopathy: Secondary | ICD-10-CM | POA: Insufficient documentation

## 2021-10-04 DIAGNOSIS — G629 Polyneuropathy, unspecified: Secondary | ICD-10-CM | POA: Insufficient documentation

## 2021-10-04 LAB — CBC WITH DIFFERENTIAL/PLATELET
Abs Immature Granulocytes: 0.02 10*3/uL (ref 0.00–0.07)
Basophils Absolute: 0 10*3/uL (ref 0.0–0.1)
Basophils Relative: 1 %
Eosinophils Absolute: 0.6 10*3/uL — ABNORMAL HIGH (ref 0.0–0.5)
Eosinophils Relative: 8 %
HCT: 38 % — ABNORMAL LOW (ref 39.0–52.0)
Hemoglobin: 13.4 g/dL (ref 13.0–17.0)
Immature Granulocytes: 0 %
Lymphocytes Relative: 25 %
Lymphs Abs: 2 10*3/uL (ref 0.7–4.0)
MCH: 34.6 pg — ABNORMAL HIGH (ref 26.0–34.0)
MCHC: 35.3 g/dL (ref 30.0–36.0)
MCV: 98.2 fL (ref 80.0–100.0)
Monocytes Absolute: 0.6 10*3/uL (ref 0.1–1.0)
Monocytes Relative: 8 %
Neutro Abs: 4.7 10*3/uL (ref 1.7–7.7)
Neutrophils Relative %: 58 %
Platelets: 254 10*3/uL (ref 150–400)
RBC: 3.87 MIL/uL — ABNORMAL LOW (ref 4.22–5.81)
RDW: 15.3 % (ref 11.5–15.5)
WBC: 8 10*3/uL (ref 4.0–10.5)
nRBC: 0 % (ref 0.0–0.2)

## 2021-10-04 LAB — COMPREHENSIVE METABOLIC PANEL
ALT: 10 U/L (ref 0–44)
AST: 13 U/L — ABNORMAL LOW (ref 15–41)
Albumin: 3 g/dL — ABNORMAL LOW (ref 3.5–5.0)
Alkaline Phosphatase: 59 U/L (ref 38–126)
Anion gap: 9 (ref 5–15)
BUN: 21 mg/dL (ref 8–23)
CO2: 24 mmol/L (ref 22–32)
Calcium: 8.5 mg/dL — ABNORMAL LOW (ref 8.9–10.3)
Chloride: 99 mmol/L (ref 98–111)
Creatinine, Ser: 1.25 mg/dL — ABNORMAL HIGH (ref 0.61–1.24)
GFR, Estimated: 60 mL/min — ABNORMAL LOW (ref >60)
Glucose, Bld: 150 mg/dL — ABNORMAL HIGH (ref 70–99)
Potassium: 3.2 mmol/L — ABNORMAL LOW (ref 3.5–5.1)
Sodium: 132 mmol/L — ABNORMAL LOW (ref 135–145)
Total Bilirubin: 1 mg/dL (ref 0.3–1.2)
Total Protein: 10.5 g/dL — ABNORMAL HIGH (ref 6.5–8.1)

## 2021-10-04 LAB — LACTATE DEHYDROGENASE: LDH: 107 U/L (ref 98–192)

## 2021-10-05 LAB — KAPPA/LAMBDA LIGHT CHAINS
Kappa free light chain: 570.5 mg/L — ABNORMAL HIGH (ref 3.3–19.4)
Kappa, lambda light chain ratio: 54.33 — ABNORMAL HIGH (ref 0.26–1.65)
Lambda free light chains: 10.5 mg/L (ref 5.7–26.3)

## 2021-10-06 LAB — PROTEIN ELECTROPHORESIS, SERUM
A/G Ratio: 0.6 — ABNORMAL LOW (ref 0.7–1.7)
Albumin ELP: 3.9 g/dL (ref 2.9–4.4)
Alpha-1-Globulin: 0.2 g/dL (ref 0.0–0.4)
Alpha-2-Globulin: 0.5 g/dL (ref 0.4–1.0)
Beta Globulin: 0.8 g/dL (ref 0.7–1.3)
Gamma Globulin: 4.7 g/dL — ABNORMAL HIGH (ref 0.4–1.8)
Globulin, Total: 6.2 g/dL — ABNORMAL HIGH (ref 2.2–3.9)
M-Spike, %: 4.4 g/dL — ABNORMAL HIGH
Total Protein ELP: 10.1 g/dL — ABNORMAL HIGH (ref 6.0–8.5)

## 2021-10-10 ENCOUNTER — Ambulatory Visit: Payer: Medicare Other | Admitting: Podiatry

## 2021-10-10 DIAGNOSIS — E0843 Diabetes mellitus due to underlying condition with diabetic autonomic (poly)neuropathy: Secondary | ICD-10-CM

## 2021-10-10 DIAGNOSIS — L97512 Non-pressure chronic ulcer of other part of right foot with fat layer exposed: Secondary | ICD-10-CM

## 2021-10-10 DIAGNOSIS — R52 Pain, unspecified: Secondary | ICD-10-CM

## 2021-10-10 NOTE — Progress Notes (Signed)
Subjective:  76 y.o. male with PMHx of diabetes mellitus presenting today for second opinion regarding an ulcer that has been present for about 3 months now to the plantar aspect of the right foot.  He has traditionally been seeing Dr. Irving Shows, podiatry, for management.  His wife is very concerned and presents for second opinion.  Currently they are applying mupirocin ointment with a light dressing to the wound.  He has diabetic shoes and insoles that he received here from our office   Past Medical History:  Diagnosis Date   Adverse effect of unspecified drugs, medicaments and biological substances, initial encounter    Essential (primary) hypertension    Hyperlipidemia, unspecified    Impotence of organic origin    Malignant melanoma of skin, unspecified ([place])    Malignant neoplasm of prostate ([place])    Multiple myeloma ([place])    Neuropathy    Non-pressure chronic ulcer of other part of unspecified foot with unspecified severity ([place])    Reflux esophagitis    Restless legs syndrome    Tinea unguium    Type 2 diabetes mellitus with diabetic neuropathy, unspecified ([place])    Type 2 diabetes mellitus with other specified complication (HCC)    Unspecified atrial fibrillation ([place])    Past Surgical History:  Procedure Laterality Date   CATARACT EXTRACTION     HERNIA REPAIR     melanoma removal     PROSTATE SURGERY     Allergies  Allergen Reactions   Penicillins Rash    Also swelling reaction        Objective/Physical Exam General: The patient is alert and oriented x3 in no acute distress.  Dermatology:  Wound #1 noted to the plantar aspect of the right first MTP joint measuring approximately 3.0 x 3.0 x 0.2 cm (LxWxD).  Please see above noted photo  To the noted ulceration(s), there is no eschar. There is a moderate amount of slough, fibrin, and necrotic tissue noted. Granulation tissue and wound base is red. There is a minimal amount of serosanguineous drainage noted. There is  no exposed bone muscle-tendon ligament or joint. There is no malodor. Periwound integrity is intact. Skin is warm, dry and supple bilateral lower extremities.  Vascular: Palpable pedal pulses bilaterally. No edema or erythema noted. Capillary refill within normal limits.  Neurological: Epicritic and protective threshold diminished bilaterally.   Musculoskeletal Exam: No gross pedal deformity  Assessment: 1. ulcer right plantar foot secondary to diabetes mellitus 2. diabetes mellitus w/ peripheral neuropathy   Plan of Care:  1. Patient was evaluated.  Reassured the patient and spouse that I do believe Dr. Irving Shows has been managing the wound appropriately.  He has been performing routine serial debridements and offloading techniques. 2. medically necessary excisional debridement including subcutaneous tissue was performed using a tissue nipper and a chisel blade. Excisional debridement of all the necrotic nonviable tissue down to healthy bleeding viable tissue was performed with post-debridement measurements same as pre-. 3. the wound was cleansed and dry sterile dressing applied. 4.  Continue mupirocin ointment  5.  Offloading felt metatarsal pads were applied to the insoles in the shoes.  Continue diabetic shoes and insoles 6.  Patient is to return to clinic as needed.  Will defer management and treatment to Dr. Irving Shows who is actively managing the wound   Edrick Kins, DPM [facility]  Dr. Edrick Kins, DPM    [address]  [place], [place] [address]                Office [phone]  Fax [phone]     

## 2021-10-11 ENCOUNTER — Inpatient Hospital Stay (HOSPITAL_COMMUNITY): Payer: Medicare Other | Admitting: Hematology

## 2021-10-11 VITALS — BP 157/91 | HR 86 | Temp 98.4°F | Resp 18 | Ht 67.72 in | Wt 184.5 lb

## 2021-10-11 DIAGNOSIS — G629 Polyneuropathy, unspecified: Secondary | ICD-10-CM | POA: Diagnosis not present

## 2021-10-11 DIAGNOSIS — D472 Monoclonal gammopathy: Secondary | ICD-10-CM

## 2021-10-11 DIAGNOSIS — Z79899 Other long term (current) drug therapy: Secondary | ICD-10-CM | POA: Diagnosis not present

## 2021-10-11 NOTE — Progress Notes (Signed)
[place], [place] [address]   CLINIC:  Medical Oncology/Hematology  PCP:  Celene Squibb, MD [address] [place] [address]  [phone]  REASON FOR VISIT:  Follow-up for smoldering multiple myeloma  PRIOR THERAPY: none  CURRENT THERAPY: surveillance  INTERVAL HISTORY:  [name] [name], a 76 y.o. male, returns for routine follow-up for his smoldering multiple myeloma. [name] [name] was last seen on 07/05/2021.  Today he reports feeling good. He started Doxycycline this month and completed this course 2 days ago; he reports he will be starting another course of doxycycline following this course. He reports a sore on his right metatarsal-phalangeal joint.   REVIEW OF SYSTEMS:  Review of Systems  Constitutional:  Negative for appetite change and fatigue.  HENT:   Positive for trouble swallowing.   Genitourinary:  Positive for frequency.   Musculoskeletal:  Positive for arthralgias (5/10).  Skin:  Positive for wound (sore on R foot).  Neurological:  Positive for dizziness and numbness.  All other systems reviewed and are negative.   PAST MEDICAL/SURGICAL HISTORY:  Past Medical History:  Diagnosis Date   Adverse effect of unspecified drugs, medicaments and biological substances, initial encounter    Essential (primary) hypertension    Hyperlipidemia, unspecified    Impotence of organic origin    Malignant melanoma of skin, unspecified ([place])    Malignant neoplasm of prostate ([place])    Multiple myeloma (HCC)    Neuropathy    Non-pressure chronic ulcer of other part of unspecified foot with unspecified severity ([facility])    Reflux esophagitis    Restless legs syndrome    Tinea unguium    Type 2 diabetes mellitus with diabetic neuropathy, unspecified ([place])    Type 2 diabetes mellitus with other specified complication (HCC)    Unspecified atrial fibrillation (HCC)    Past Surgical History:  Procedure Laterality Date   CATARACT  EXTRACTION     HERNIA REPAIR     melanoma removal     PROSTATE SURGERY      SOCIAL HISTORY:  Social History   Socioeconomic History   Marital status: Married    Spouse name: Not on file   Number of children: Not on file   Years of education: Not on file   Highest education level: Not on file  Occupational History   Not on file  Tobacco Use   Smoking status: Never   Smokeless tobacco: Never  Substance and Sexual Activity   Alcohol use: No    Alcohol/week: 0.0 standard drinks of alcohol   Drug use: No   Sexual activity: Not Currently  Other Topics Concern   Not on file  Social History Narrative   Not on file   Social Determinants of Health   Financial Resource Strain: Low Risk  (05/13/2020)   Overall Financial Resource Strain (CARDIA)    Difficulty of Paying Living Expenses: Not hard at all  Food Insecurity: No Food Insecurity (05/13/2020)   Hunger Vital Sign    Worried About Running Out of Food in the Last Year: Never true    Ran Out of Food in the Last Year: Never true  Transportation Needs: No Transportation Needs (05/13/2020)   PRAPARE - Hydrologist (Medical): No    Lack of Transportation (Non-Medical): No  Physical Activity: Insufficiently Active (05/13/2020)   Exercise Vital Sign    Days of Exercise per Week: 3 days  Minutes of Exercise per Session: 20 min  Stress: No Stress Concern Present (05/13/2020)   [place]    Feeling of Stress : Not at all  Social Connections: Moderately Integrated (05/13/2020)   Social Connection and Isolation Panel [NHANES]    Frequency of Communication with Friends and Family: Twice a week    Frequency of Social Gatherings with Friends and Family: Twice a week    Attends Religious Services: 1 to 4 times per year    Active Member of [employer] or Organizations: No    Attends Archivist Meetings: Never    Marital Status: Married   Human resources officer Violence: Not At Risk (05/13/2020)   Humiliation, Afraid, Rape, and Kick questionnaire    Fear of Current or Ex-Partner: No    Emotionally Abused: No    Physically Abused: No    Sexually Abused: No    FAMILY HISTORY:  No family history on file.  CURRENT MEDICATIONS:  Current Outpatient Medications  Medication Sig Dispense Refill   atenolol-chlorthalidone (TENORETIC) 50-25 MG per tablet Take 1 tablet by mouth daily. Takes 1/2 daily per Dr Luan Pulling     azithromycin (ZITHROMAX) 250 MG tablet Take by mouth.     diclofenac sodium (VOLTAREN) 1 % GEL Apply 2 g topically daily as needed.     gabapentin (NEURONTIN) 300 MG capsule Take 300 mg by mouth every 4 (four) hours.     loperamide (IMODIUM A-D) 2 MG tablet Take 2 mg by mouth 4 (four) times daily as needed for diarrhea or loose stools.     losartan (COZAAR) 50 MG tablet Take 50 mg by mouth daily.     metFORMIN (GLUCOPHAGE) 500 MG tablet Take 500 mg by mouth 2 (two) times daily with a meal.     pantoprazole (PROTONIX) 40 MG tablet Take 40 mg by mouth daily.     silver sulfADIAZINE (SILVADENE) 1 % cream Apply topically.     No current facility-administered medications for this visit.    ALLERGIES:  Allergies  Allergen Reactions   Penicillins Rash    Also swelling reaction     PHYSICAL EXAM:  Performance status (ECOG): 1 - Symptomatic but completely ambulatory  There were no vitals filed for this visit. Wt Readings from Last 3 Encounters:  07/05/21 186 lb 11.2 oz (84.7 kg)  06/20/21 190 lb 1.6 oz (86.2 kg)  02/14/21 188 lb 1.6 oz (85.3 kg)   Physical Exam Vitals reviewed.  Constitutional:      Appearance: Normal appearance.  Cardiovascular:     Rate and Rhythm: Normal rate and regular rhythm.     Pulses: Normal pulses.     Heart sounds: Normal heart sounds.  Pulmonary:     Effort: Pulmonary effort is normal.     Breath sounds: Normal breath sounds.  Neurological:     General: No focal deficit present.      Mental Status: He is alert and oriented to person, place, and time.  Psychiatric:        Mood and Affect: Mood normal.        Behavior: Behavior normal.     LABORATORY DATA:  I have reviewed the labs as listed.     Latest Ref Rng & Units 10/04/2021    8:24 AM 06/13/2021    1:14 PM 02/07/2021    8:48 AM  CBC  WBC 4.0 - 10.5 K/uL 8.0  9.6  9.1   Hemoglobin 13.0 - 17.0 g/dL  13.4  13.9  14.0   Hematocrit 39.0 - 52.0 % 38.0  39.8  40.0   Platelets 150 - 400 K/uL 254  270  245       Latest Ref Rng & Units 10/04/2021    8:24 AM 06/13/2021    1:14 PM 02/14/2021    2:25 PM  CMP  Glucose 70 - 99 mg/dL 150  146  200   BUN 8 - 23 mg/dL '21  19  21   ' Creatinine 0.61 - 1.24 mg/dL 1.25  1.20  1.14   Sodium 135 - 145 mmol/L 132  132  133   Potassium 3.5 - 5.1 mmol/L 3.2  3.6  3.2   Chloride 98 - 111 mmol/L 99  97  100   CO2 22 - 32 mmol/L '24  25  23   ' Calcium 8.9 - 10.3 mg/dL 8.5  8.9  8.8   Total Protein 6.5 - 8.1 g/dL 10.5  10.4  9.6   Total Bilirubin 0.3 - 1.2 mg/dL 1.0  0.9  0.8   Alkaline Phos 38 - 126 U/L 59  74  69   AST 15 - 41 U/L '13  14  16   ' ALT 0 - 44 U/L '10  13  13       ' Component Value Date/Time   RBC 3.87 (L) 10/04/2021 0824   MCV 98.2 10/04/2021 0824   MCH 34.6 (H) 10/04/2021 0824   MCHC 35.3 10/04/2021 0824   RDW 15.3 10/04/2021 0824   LYMPHSABS 2.0 10/04/2021 0824   MONOABS 0.6 10/04/2021 0824   EOSABS 0.6 (H) 10/04/2021 0824   BASOSABS 0.0 10/04/2021 0824    DIAGNOSTIC IMAGING:  I have independently reviewed the scans and discussed with the patient. No results found.   ASSESSMENT:  IgG kappa smoldering multiple myeloma: - Skeletal survey in February 2022 was negative. - Bone marrow biopsy on 06/06/2020 with 20% plasma cells.  Myeloma FISH panel with monosomy 13/deletion 13.  Chromosome analysis 46, XY (20). - No "crab" features.   2.  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 - No major chemical  exposures.  He had personal history of melanoma of the upper lip. - Father had acute myeloid leukemia.  Mother had multiple myeloma.  Son died of acute lymphocytic leukemia   PLAN:  1.  IgG kappa smoldering multiple myeloma: - PET scan from 06/29/2021: No evidence of myeloma or plasmacytoma. - Skeletal survey on 06/13/2021: No lytic lesions. - Labs from 10/04/2021: Creatinine has increased to 1.25.  Calcium is 8.5, albumin 3.0.  M spike increased to 4.4 from 3.8 at last visit.  Hemoglobin is 13.4.  Kappa light chains increased to 570 and ratio increased to 54 from 38 previously. - As his numbers are worsening, I have recommended MRI of the cervical, thoracic, lumbar spine.  We will see him back after that.  If there is any lytic lesions identified, we will proceed with treatment for multiple myeloma.  2.  Peripheral neuropathy: - This is stable.  Continue gabapentin 300 mg 3 times daily.  Orders placed this encounter:  No orders of the defined types were placed in this encounter.    Derek Jack, MD [place] [phone]   I, Thana Ates, am acting as a scribe for Dr. Derek Jack.  I, Derek Jack MD, have reviewed the above documentation for accuracy and completeness, and I agree with the above.

## 2021-10-11 NOTE — Patient Instructions (Addendum)
[place] at [facility] Discharge Instructions   You were seen and examined today by Dr. Delton Coombes.  He reviewed the results of your lab work. Your m-spike has gone up to 4.4 and your kidney function has gotten worse. Dr. Raliegh Ip would like to start you on treatment for multiple myeloma, but we need to get MRI of your spine to see if there are any lesions there so we can start treatment. Treatment would involve taking shots and pills for this.   We will see you back after the spine MRI to discuss treatment in more detail.   Thank you for choosing [place] at [facility] to provide your oncology and hematology care.  To afford each patient quality time with our provider, please arrive at least 15 minutes before your scheduled appointment time.   If you have a lab appointment with the [facility] please come in thru the Main Entrance and check in at the main information desk.  You need to re-schedule your appointment should you arrive 10 or more minutes late.  We strive to give you quality time with our providers, and arriving late affects you and other patients whose appointments are after yours.  Also, if you no show three or more times for appointments you may be dismissed from the clinic at the providers discretion.     Again, thank you for choosing [facility].  Our hope is that these requests will decrease the amount of time that you wait before being seen by our physicians.       _____________________________________________________________  Should you have questions after your visit to [facility], please contact our office at [phone] and follow the prompts.  Our office hours are 8:00 a.m. and 4:30 p.m. Monday - Friday.  Please note that voicemails left after 4:00 p.m. may not be returned until the following business day.  We are closed weekends and major holidays.  You do have access to a nurse 24-7, just call  the main number to the clinic [phone] and do not press any options, hold on the line and a nurse will answer the phone.    For prescription refill requests, have your pharmacy contact our office and allow 72 hours.    Due to Covid, you will need to wear a mask upon entering the hospital. If you do not have a mask, a mask will be given to you at the Main Entrance upon arrival. For doctor visits, patients may have 1 support person age 43 or older with them. For treatment visits, patients can not have anyone with them due to social distancing guidelines and our immunocompromised population.

## 2021-10-12 DIAGNOSIS — L84 Corns and callosities: Secondary | ICD-10-CM | POA: Diagnosis not present

## 2021-10-12 DIAGNOSIS — B351 Tinea unguium: Secondary | ICD-10-CM | POA: Diagnosis not present

## 2021-10-12 DIAGNOSIS — E1142 Type 2 diabetes mellitus with diabetic polyneuropathy: Secondary | ICD-10-CM | POA: Diagnosis not present

## 2021-10-18 DIAGNOSIS — E1140 Type 2 diabetes mellitus with diabetic neuropathy, unspecified: Secondary | ICD-10-CM | POA: Diagnosis not present

## 2021-10-18 DIAGNOSIS — E782 Mixed hyperlipidemia: Secondary | ICD-10-CM | POA: Diagnosis not present

## 2021-10-20 ENCOUNTER — Ambulatory Visit (HOSPITAL_COMMUNITY)
Admission: RE | Admit: 2021-10-20 | Discharge: 2021-10-20 | Disposition: A | Discharge status: Home / Self Care | Payer: Medicare Other | Source: Ambulatory Visit | Attending: Hematology | Admitting: Hematology

## 2021-10-20 DIAGNOSIS — M47816 Spondylosis without myelopathy or radiculopathy, lumbar region: Secondary | ICD-10-CM | POA: Diagnosis not present

## 2021-10-20 DIAGNOSIS — D472 Monoclonal gammopathy: Secondary | ICD-10-CM | POA: Insufficient documentation

## 2021-10-20 DIAGNOSIS — C9000 Multiple myeloma not having achieved remission: Secondary | ICD-10-CM | POA: Diagnosis not present

## 2021-10-20 DIAGNOSIS — M5134 Other intervertebral disc degeneration, thoracic region: Secondary | ICD-10-CM | POA: Diagnosis not present

## 2021-10-20 DIAGNOSIS — M5124 Other intervertebral disc displacement, thoracic region: Secondary | ICD-10-CM | POA: Diagnosis not present

## 2021-10-20 MED ORDER — GADOBUTROL 1 MMOL/ML IV SOLN
8.0000 mL | Freq: Once | INTRAVENOUS | Status: AC | PRN
  Administered 2021-10-20: 8 mL via INTRAVENOUS

## 2021-10-23 ENCOUNTER — Ambulatory Visit (HOSPITAL_COMMUNITY)
Admission: RE | Admit: 2021-10-23 | Discharge: 2021-10-23 | Disposition: A | Discharge status: Home / Self Care | Payer: Medicare Other | Source: Ambulatory Visit | Attending: Hematology | Admitting: Hematology

## 2021-10-23 DIAGNOSIS — M50321 Other cervical disc degeneration at C4-C5 level: Secondary | ICD-10-CM | POA: Diagnosis not present

## 2021-10-23 DIAGNOSIS — M5031 Other cervical disc degeneration,  high cervical region: Secondary | ICD-10-CM | POA: Diagnosis not present

## 2021-10-23 DIAGNOSIS — D472 Monoclonal gammopathy: Secondary | ICD-10-CM | POA: Diagnosis not present

## 2021-10-23 DIAGNOSIS — M5126 Other intervertebral disc displacement, lumbar region: Secondary | ICD-10-CM | POA: Diagnosis not present

## 2021-10-23 DIAGNOSIS — C9000 Multiple myeloma not having achieved remission: Secondary | ICD-10-CM | POA: Diagnosis not present

## 2021-10-23 DIAGNOSIS — M4802 Spinal stenosis, cervical region: Secondary | ICD-10-CM | POA: Diagnosis not present

## 2021-10-23 DIAGNOSIS — M48061 Spinal stenosis, lumbar region without neurogenic claudication: Secondary | ICD-10-CM | POA: Diagnosis not present

## 2021-10-23 DIAGNOSIS — M4186 Other forms of scoliosis, lumbar region: Secondary | ICD-10-CM | POA: Diagnosis not present

## 2021-10-23 DIAGNOSIS — M4316 Spondylolisthesis, lumbar region: Secondary | ICD-10-CM | POA: Diagnosis not present

## 2021-10-23 MED ORDER — GADOBUTROL 1 MMOL/ML IV SOLN
8.0000 mL | Freq: Once | INTRAVENOUS | Status: AC | PRN
  Administered 2021-10-23: 8 mL via INTRAVENOUS

## 2021-10-25 DIAGNOSIS — E782 Mixed hyperlipidemia: Secondary | ICD-10-CM | POA: Diagnosis not present

## 2021-10-25 DIAGNOSIS — G3184 Mild cognitive impairment, so stated: Secondary | ICD-10-CM | POA: Diagnosis not present

## 2021-10-25 DIAGNOSIS — K219 Gastro-esophageal reflux disease without esophagitis: Secondary | ICD-10-CM | POA: Diagnosis not present

## 2021-10-25 DIAGNOSIS — N182 Chronic kidney disease, stage 2 (mild): Secondary | ICD-10-CM | POA: Diagnosis not present

## 2021-10-25 DIAGNOSIS — E1140 Type 2 diabetes mellitus with diabetic neuropathy, unspecified: Secondary | ICD-10-CM | POA: Diagnosis not present

## 2021-10-25 DIAGNOSIS — L97512 Non-pressure chronic ulcer of other part of right foot with fat layer exposed: Secondary | ICD-10-CM | POA: Diagnosis not present

## 2021-10-25 DIAGNOSIS — C439 Malignant melanoma of skin, unspecified: Secondary | ICD-10-CM | POA: Diagnosis not present

## 2021-10-25 DIAGNOSIS — I10 Essential (primary) hypertension: Secondary | ICD-10-CM | POA: Diagnosis not present

## 2021-10-25 DIAGNOSIS — G720 Drug-induced myopathy: Secondary | ICD-10-CM | POA: Diagnosis not present

## 2021-10-25 DIAGNOSIS — C9000 Multiple myeloma not having achieved remission: Secondary | ICD-10-CM | POA: Diagnosis not present

## 2021-10-26 ENCOUNTER — Inpatient Hospital Stay (HOSPITAL_COMMUNITY): Payer: Medicare Other | Admitting: Hematology

## 2021-10-26 VITALS — BP 149/89 | HR 75 | Resp 16 | Wt 186.7 lb

## 2021-10-26 DIAGNOSIS — G629 Polyneuropathy, unspecified: Secondary | ICD-10-CM | POA: Diagnosis not present

## 2021-10-26 DIAGNOSIS — D472 Monoclonal gammopathy: Secondary | ICD-10-CM

## 2021-10-26 DIAGNOSIS — Z79899 Other long term (current) drug therapy: Secondary | ICD-10-CM | POA: Diagnosis not present

## 2021-10-26 NOTE — Progress Notes (Signed)
[name], [place] [id number]   CLINIC:  Medical Oncology/Hematology  PCP:  Celene Squibb, MD [address] [name] [address]  [phone]  REASON FOR VISIT:  Follow-up for smoldering multiple myeloma  PRIOR THERAPY: none  CURRENT THERAPY: surveillance  INTERVAL HISTORY:  [name] [name], a 76 y.o. male, returns for routine follow-up for his smoldering multiple myeloma. [name] [name] was last seen on 10/11/2021.  Today he reports feeling good. He reports pain in his right toes. He also reports numbness in his hands.   REVIEW OF SYSTEMS:  Review of Systems  Constitutional:  Negative for appetite change and fatigue.  Musculoskeletal:  Positive for arthralgias (5/10 toes).  Neurological:  Positive for numbness (neuropathy - hands).  All other systems reviewed and are negative.   PAST MEDICAL/SURGICAL HISTORY:  Past Medical History:  Diagnosis Date   Adverse effect of unspecified drugs, medicaments and biological substances, initial encounter    Essential (primary) hypertension    Hyperlipidemia, unspecified    Impotence of organic origin    Malignant melanoma of skin, unspecified ([place])    Malignant neoplasm of prostate ([place])    Multiple myeloma (HCC)    Neuropathy    Non-pressure chronic ulcer of other part of unspecified foot with unspecified severity (HCC)    Reflux esophagitis    Restless legs syndrome    Tinea unguium    Type 2 diabetes mellitus with diabetic neuropathy, unspecified ([place])    Type 2 diabetes mellitus with other specified complication (HCC)    Unspecified atrial fibrillation (HCC)    Past Surgical History:  Procedure Laterality Date   CATARACT EXTRACTION     HERNIA REPAIR     melanoma removal     PROSTATE SURGERY      SOCIAL HISTORY:  Social History   Socioeconomic History   Marital status: Married    Spouse name: Not on file   Number of children: Not on file   Years of education: Not on  file   Highest education level: Not on file  Occupational History   Not on file  Tobacco Use   Smoking status: Never   Smokeless tobacco: Never  Substance and Sexual Activity   Alcohol use: No    Alcohol/week: 0.0 standard drinks of alcohol   Drug use: No   Sexual activity: Not Currently  Other Topics Concern   Not on file  Social History Narrative   Not on file   Social Determinants of Health   Financial Resource Strain: Low Risk  (05/13/2020)   Overall Financial Resource Strain (CARDIA)    Difficulty of Paying Living Expenses: Not hard at all  Food Insecurity: No Food Insecurity (05/13/2020)   Hunger Vital Sign    Worried About Running Out of Food in the Last Year: Never true    Ran Out of Food in the Last Year: Never true  Transportation Needs: No Transportation Needs (05/13/2020)   PRAPARE - Hydrologist (Medical): No    Lack of Transportation (Non-Medical): No  Physical Activity: Insufficiently Active (05/13/2020)   Exercise Vital Sign    Days of Exercise per Week: 3 days    Minutes of Exercise per Session: 20 min  Stress: No Stress Concern Present (05/13/2020)   [place]    Feeling of Stress : Not at all  Social Connections: Moderately Integrated (05/13/2020)   Social  Connection and Isolation Panel [NHANES]    Frequency of Communication with Friends and Family: Twice a week    Frequency of Social Gatherings with Friends and Family: Twice a week    Attends Religious Services: 1 to 4 times per year    Active Member of [employer] or Organizations: No    Attends Archivist Meetings: Never    Marital Status: Married  Human resources officer Violence: Not At Risk (05/13/2020)   Humiliation, Afraid, Rape, and Kick questionnaire    Fear of Current or Ex-Partner: No    Emotionally Abused: No    Physically Abused: No    Sexually Abused: No    FAMILY HISTORY:  No family history on  file.  CURRENT MEDICATIONS:  Current Outpatient Medications  Medication Sig Dispense Refill   Accu-Chek FastClix Lancets MISC Apply topically 2 (two) times daily.     atenolol-chlorthalidone (TENORETIC) 50-25 MG per tablet Take 1 tablet by mouth daily. Takes 1/2 daily per Dr Luan Pulling     diclofenac sodium (VOLTAREN) 1 % GEL Apply 2 g topically daily as needed.     gabapentin (NEURONTIN) 300 MG capsule Take 300 mg by mouth every 4 (four) hours.     loperamide (IMODIUM A-D) 2 MG tablet Take 2 mg by mouth 4 (four) times daily as needed for diarrhea or loose stools.     losartan (COZAAR) 50 MG tablet Take 50 mg by mouth daily.     metFORMIN (GLUCOPHAGE) 500 MG tablet Take 500 mg by mouth 2 (two) times daily with a meal.     pantoprazole (PROTONIX) 40 MG tablet Take 40 mg by mouth daily.     silver sulfADIAZINE (SILVADENE) 1 % cream Apply topically.     No current facility-administered medications for this visit.    ALLERGIES:  Allergies  Allergen Reactions   Penicillins Rash    Also swelling reaction    Gadavist [Gadobutrol] Nausea Only    Patient became nauseated immediately after contrast injection.  Was better after a few minutes.     PHYSICAL EXAM:  Performance status (ECOG): 1 - Symptomatic but completely ambulatory  Vitals:   10/26/21 1046  BP: (!) 149/89  Pulse: 75  Resp: 16  SpO2: 99%   Wt Readings from Last 3 Encounters:  10/26/21 186 lb 11.7 oz (84.7 kg)  10/11/21 184 lb 8 oz (83.7 kg)  07/05/21 186 lb 11.2 oz (84.7 kg)   Physical Exam Vitals reviewed.  Constitutional:      Appearance: Normal appearance.  Cardiovascular:     Rate and Rhythm: Normal rate and regular rhythm.     Pulses: Normal pulses.     Heart sounds: Normal heart sounds.  Pulmonary:     Effort: Pulmonary effort is normal.     Breath sounds: Normal breath sounds.  Neurological:     General: No focal deficit present.     Mental Status: He is alert and oriented to person, place, and time.   Psychiatric:        Mood and Affect: Mood normal.        Behavior: Behavior normal.     LABORATORY DATA:  I have reviewed the labs as listed.     Latest Ref Rng & Units 10/04/2021    8:24 AM 06/13/2021    1:14 PM 02/07/2021    8:48 AM  CBC  WBC 4.0 - 10.5 K/uL 8.0  9.6  9.1   Hemoglobin 13.0 - 17.0 g/dL 13.4  13.9  14.0  Hematocrit 39.0 - 52.0 % 38.0  39.8  40.0   Platelets 150 - 400 K/uL 254  270  245       Latest Ref Rng & Units 10/04/2021    8:24 AM 06/13/2021    1:14 PM 02/14/2021    2:25 PM  CMP  Glucose 70 - 99 mg/dL 150  146  200   BUN 8 - 23 mg/dL _0 Creatinine 0.61 - 1.24 mg/dL 1.25  1.20  1.14   Sodium 135 - 145 mmol/L 132  132  133   Potassium 3.5 - 5.1 mmol/L 3.2  3.6  3.2   Chloride 98 - 111 mmol/L 99  97  100   CO2 22 - 32 mmol/L _1 Calcium 8.9 - 10.3 mg/dL 8.5  8.9  8.8   Total Protein 6.5 - 8.1 g/dL 10.5  10.4  9.6   Total Bilirubin 0.3 - 1.2 mg/dL 1.0  0.9  0.8   Alkaline Phos 38 - 126 U/L 59  74  69   AST 15 - 41 U/L _2 ALT 0 - 44 U/L _3 Component Value Date/Time   RBC 3.87 (L) 10/04/2021 0824   MCV 98.2 10/04/2021 0824   MCH 34.6 (H) 10/04/2021 0824   MCHC 35.3 10/04/2021 0824   RDW 15.3 10/04/2021 0824   LYMPHSABS 2.0 10/04/2021 0824   MONOABS 0.6 10/04/2021 0824   EOSABS 0.6 (H) 10/04/2021 0824   BASOSABS 0.0 10/04/2021 0824    DIAGNOSTIC IMAGING:  I have independently reviewed the scans and discussed with the patient. MR Cervical Spine W Wo Contrast  Result Date: 10/24/2021 CLINICAL DATA:  Smoldering myeloma with worsening M spike EXAM: MRI CERVICAL SPINE WITHOUT AND WITH CONTRAST TECHNIQUE: Multiplanar and multiecho pulse sequences of the cervical spine, to include the craniocervical junction and cervicothoracic junction, were obtained without and with intravenous contrast. CONTRAST:  25m GADAVIST GADOBUTROL 1 MMOL/ML IV SOLN COMPARISON:  06/29/2021 PET-CT FINDINGS: Alignment: Physiologic.  Vertebrae: No fracture, evidence of discitis, or bone lesion. Cord: Normal signal and morphology. Posterior Fossa, vertebral arteries, paraspinal tissues: Negative. Disc levels: C3-4 central disc protrusion contacting the ventral cord without compression C4-5 disc narrowing and endplate degeneration with uncovertebral spurs and bilateral spinal stenosis, moderate on the right and moderate to advanced on the left. No impingement or notable degeneration at the other levels. IMPRESSION: 1. Negative for bone lesion. 2. C3-4 and C4-5 disc degeneration. Electronically Signed   By: JJorje GuildM.D.   On: 10/24/2021 07:12   MR Lumbar Spine W Wo Contrast  Result Date: 10/24/2021 CLINICAL DATA:  Hematologic malignancy monitoring. Worsening M spike and elevated creatinine EXAM: MRI LUMBAR SPINE WITHOUT AND WITH CONTRAST TECHNIQUE: Multiplanar and multiecho pulse sequences of the lumbar spine were obtained without and with intravenous contrast. CONTRAST:  818mGADAVIST GADOBUTROL 1 MMOL/ML IV SOLN COMPARISON:  None Available. FINDINGS: Segmentation:  5 lumbar type vertebrae Alignment:  L4-5 anterolisthesis, grade 1.  Mild scoliosis Vertebrae:  No fracture, evidence of discitis, or bone lesion. Conus medullaris and cauda equina: Conus extends to the L1 level. Conus and cauda equina appear normal. Paraspinal and other soft tissues: Negative for perispinal mass or inflammation Disc levels: T12- L1: Unremarkable. L1-L2: Unremarkable. L2-L3: Disc narrowing and bulging with facet spurring eccentric to the right. Mild right foraminal narrowing and mild spinal stenosis L3-L4: Disc  narrowing and bulging with endplate degeneration. Degenerative facet spurring. Moderate right foraminal foraminal narrowing L4-L5: Facet osteoarthritis with asymmetric bulky spurring on the left. The disc is narrowed and bulging with left foraminal herniation. Advanced left foraminal impingement. L5-S1:Partial segmentation from the sacrum. No  degenerative changes or impingement. IMPRESSION: 1. Negative for bone lesion in the lumbar spine. 2. Degeneration and scoliosis with L4-5 anterolisthesis. 3. L4-5 advanced left foraminal impingement. L3-4 moderate right foraminal narrowing. Electronically Signed   By: Jorje Guild M.D.   On: 10/24/2021 07:09   MR SACRUM SI JOINTS W WO CONTRAST  Result Date: 10/22/2021 CLINICAL DATA:  History of multiple myeloma. Hematologic malignancy. EXAM: MRI SACRUM WITHOUT CONTRAST TECHNIQUE: Multiplanar multi-sequence MR imaging of the sacrum was performed. No intravenous contrast was administered. COMPARISON:  None Available. PET-CT 06/29/2021 FINDINGS: Bones/Joint/Cartilage No fracture or dislocation. Normal alignment. No joint effusion. No marrow signal abnormality. No aggressive osseous lesion. No SI joint widening or erosive changes. No subchondral reactive marrow changes. No SI joint effusion. Grade 1 anterolisthesis of L3 on L4 and L4 on L5. At L3-4 there is a broad-based disc bulge with a central annular fissure, mild bilateral facet arthropathy and left foraminal stenosis. At L5-S1 there is a broad-based disc bulge with a left paracentral disc protrusion, bilateral facet arthropathy, and moderate-severe left foraminal stenosis. Abnormal articulation of the right L5 transverse process with the sacrum. Ligaments, Muscles and Tendons Muscles are normal. No muscle atrophy. No muscle edema. Piriformis muscles are normal bilaterally without signal abnormality. Soft tissue No fluid collection or hematoma. No soft tissue mass. Normal neurovascular bundles. IMPRESSION: 1. No evidence of a sacroiliitis. 2. No aggressive osseous lesion to suggest malignancy. 3. Lower lumbar spine spondylosis as described above. If there is further clinical concern, recommend an MRI of the lumbar spine. Electronically Signed   By: Kathreen Devoid M.D.   On: 10/22/2021 10:05   MR Thoracic Spine W Wo Contrast  Result Date:  10/21/2021 CLINICAL DATA:  Hematologic malignancy.  Multiple myeloma. EXAM: MRI THORACIC WITHOUT AND WITH CONTRAST TECHNIQUE: Multiplanar and multiecho pulse sequences of the thoracic spine were obtained without and with intravenous contrast. CONTRAST:  18m GADAVIST GADOBUTROL 1 MMOL/ML IV SOLN COMPARISON:  None Available. FINDINGS: Alignment:  Physiologic. Vertebrae: No fracture, evidence of discitis, or bone lesion. Cord:  Normal signal and morphology. Paraspinal and other soft tissues: Negative. Disc levels: Small disc protrusions at T4-5, T8-9 and T10-11 without spinal canal stenosis. IMPRESSION: 1. No osseous lesions 2. Mild thoracic degenerative disc disease without spinal canal stenosis. Electronically Signed   By: KUlyses JarredM.D.   On: 10/21/2021 00:00     ASSESSMENT:  IgG kappa smoldering multiple myeloma: - Skeletal survey in February 2022 was negative. - Bone marrow biopsy on 06/06/2020 with 20% plasma cells.  Myeloma FISH panel with monosomy 13/deletion 13.  Chromosome analysis 46, XY (20). - No "crab" features. - PET scan from 06/29/2021: No evidence of myeloma or plasmacytoma. - Skeletal survey on 06/13/2021: No lytic lesions. - MRI of the cervical/thoracic/lumbar spine negative for bone lesions with some degenerative changes.   2.  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 - No major chemical exposures.  He had personal history of melanoma of the upper lip. - Father had acute myeloid leukemia.  Mother had multiple myeloma.  Son died of acute lymphocytic leukemia   PLAN:  1.  IgG kappa smoldering multiple myeloma: - PET scan 06/29/2021 with  no evidence of myeloma. - However labs from 10/04/2021 showed increased creatinine, M spike and light chain ratio. - Reviewed MRI of the cervical, thoracic, lumbar spine which did not show any lytic lesions. - We discussed the high risk of progression to myeloma based on his recent labs. -  Recommend follow-up in 2 months with repeat myeloma labs 1 week prior.   2.  Peripheral neuropathy: - He has pain in the feet and numbness in the hands. - Continue gabapentin 300 mg 3 times daily.  Orders placed this encounter:  No orders of the defined types were placed in this encounter.    Derek Jack, MD [place] [phone]   I, Thana Ates, am acting as a scribe for Dr. Derek Jack.  I, Derek Jack MD, have reviewed the above documentation for accuracy and completeness, and I agree with the above.

## 2021-10-26 NOTE — Patient Instructions (Signed)
[place] at [facility] Discharge Instructions   You were seen and examined today by Dr. Delton Coombes.  He reviewed the results of the MRI of your spine, which did not show any myeloma in the bones of the spine.   We will need to closely monitor you every 2 months.   Return as scheduled.    Thank you for choosing [place] at [facility] to provide your oncology and hematology care.  To afford each patient quality time with our provider, please arrive at least 15 minutes before your scheduled appointment time.   If you have a lab appointment with the [place] [facility] please come in thru the Main Entrance and check in at the main information desk.  You need to re-schedule your appointment should you arrive 10 or more minutes late.  We strive to give you quality time with our providers, and arriving late affects you and other patients whose appointments are after yours.  Also, if you no show three or more times for appointments you may be dismissed from the clinic at the providers discretion.     Again, thank you for choosing [facility].  Our hope is that these requests will decrease the amount of time that you wait before being seen by our physicians.       _____________________________________________________________  Should you have questions after your visit to [facility], please contact our office at [phone] and follow the prompts.  Our office hours are 8:00 a.m. and 4:30 p.m. Monday - Friday.  Please note that voicemails left after 4:00 p.m. may not be returned until the following business day.  We are closed weekends and major holidays.  You do have access to a nurse 24-7, just call the main number to the clinic [phone] and do not press any options, hold on the line and a nurse will answer the phone.    For prescription refill requests, have your pharmacy contact our office and allow 72 hours.    Due to  Covid, you will need to wear a mask upon entering the hospital. If you do not have a mask, a mask will be given to you at the Main Entrance upon arrival. For doctor visits, patients may have 1 support person age 35 or older with them. For treatment visits, patients can not have anyone with them due to social distancing guidelines and our immunocompromised population.

## 2021-11-16 DIAGNOSIS — L97511 Non-pressure chronic ulcer of other part of right foot limited to breakdown of skin: Secondary | ICD-10-CM | POA: Diagnosis not present

## 2021-11-30 DIAGNOSIS — I129 Hypertensive chronic kidney disease with stage 1 through stage 4 chronic kidney disease, or unspecified chronic kidney disease: Secondary | ICD-10-CM | POA: Diagnosis not present

## 2021-11-30 DIAGNOSIS — N182 Chronic kidney disease, stage 2 (mild): Secondary | ICD-10-CM | POA: Diagnosis not present

## 2021-11-30 DIAGNOSIS — E1140 Type 2 diabetes mellitus with diabetic neuropathy, unspecified: Secondary | ICD-10-CM | POA: Diagnosis not present

## 2021-11-30 DIAGNOSIS — E782 Mixed hyperlipidemia: Secondary | ICD-10-CM | POA: Diagnosis not present

## 2021-12-17 DIAGNOSIS — U071 COVID-19: Secondary | ICD-10-CM | POA: Diagnosis not present

## 2021-12-17 DIAGNOSIS — Z20822 Contact with and (suspected) exposure to covid-19: Secondary | ICD-10-CM | POA: Diagnosis not present

## 2022-01-01 ENCOUNTER — Inpatient Hospital Stay: Payer: Medicare Other | Attending: Hematology

## 2022-01-01 DIAGNOSIS — G629 Polyneuropathy, unspecified: Secondary | ICD-10-CM | POA: Diagnosis not present

## 2022-01-01 DIAGNOSIS — D472 Monoclonal gammopathy: Secondary | ICD-10-CM | POA: Insufficient documentation

## 2022-01-01 DIAGNOSIS — Z79899 Other long term (current) drug therapy: Secondary | ICD-10-CM | POA: Insufficient documentation

## 2022-01-01 LAB — CBC WITH DIFFERENTIAL/PLATELET
Abs Immature Granulocytes: 0.03 10*3/uL (ref 0.00–0.07)
Basophils Absolute: 0.1 10*3/uL (ref 0.0–0.1)
Basophils Relative: 1 %
Eosinophils Absolute: 0.4 10*3/uL (ref 0.0–0.5)
Eosinophils Relative: 5 %
HCT: 33.2 % — ABNORMAL LOW (ref 39.0–52.0)
Hemoglobin: 11.7 g/dL — ABNORMAL LOW (ref 13.0–17.0)
Immature Granulocytes: 0 %
Lymphocytes Relative: 25 %
Lymphs Abs: 2 10*3/uL (ref 0.7–4.0)
MCH: 35 pg — ABNORMAL HIGH (ref 26.0–34.0)
MCHC: 35.2 g/dL (ref 30.0–36.0)
MCV: 99.4 fL (ref 80.0–100.0)
Monocytes Absolute: 0.8 10*3/uL (ref 0.1–1.0)
Monocytes Relative: 10 %
Neutro Abs: 4.5 10*3/uL (ref 1.7–7.7)
Neutrophils Relative %: 59 %
Platelets: 299 10*3/uL (ref 150–400)
RBC: 3.34 MIL/uL — ABNORMAL LOW (ref 4.22–5.81)
RDW: 15 % (ref 11.5–15.5)
WBC: 7.7 10*3/uL (ref 4.0–10.5)
nRBC: 0 % (ref 0.0–0.2)

## 2022-01-01 LAB — COMPREHENSIVE METABOLIC PANEL
ALT: 11 U/L (ref 0–44)
AST: 15 U/L (ref 15–41)
Albumin: 2.9 g/dL — ABNORMAL LOW (ref 3.5–5.0)
Alkaline Phosphatase: 74 U/L (ref 38–126)
Anion gap: 8 (ref 5–15)
BUN: 26 mg/dL — ABNORMAL HIGH (ref 8–23)
CO2: 24 mmol/L (ref 22–32)
Calcium: 8.6 mg/dL — ABNORMAL LOW (ref 8.9–10.3)
Chloride: 102 mmol/L (ref 98–111)
Creatinine, Ser: 1.49 mg/dL — ABNORMAL HIGH (ref 0.61–1.24)
GFR, Estimated: 48 mL/min — ABNORMAL LOW (ref >60)
Glucose, Bld: 133 mg/dL — ABNORMAL HIGH (ref 70–99)
Potassium: 3.2 mmol/L — ABNORMAL LOW (ref 3.5–5.1)
Sodium: 134 mmol/L — ABNORMAL LOW (ref 135–145)
Total Bilirubin: 1.1 mg/dL (ref 0.3–1.2)
Total Protein: 10.7 g/dL — ABNORMAL HIGH (ref 6.5–8.1)

## 2022-01-01 LAB — LACTATE DEHYDROGENASE: LDH: 93 U/L — ABNORMAL LOW (ref 98–192)

## 2022-01-02 LAB — KAPPA/LAMBDA LIGHT CHAINS
Kappa free light chain: 592.9 mg/L — ABNORMAL HIGH (ref 3.3–19.4)
Kappa, lambda light chain ratio: 49.82 — ABNORMAL HIGH (ref 0.26–1.65)
Lambda free light chains: 11.9 mg/L (ref 5.7–26.3)

## 2022-01-03 LAB — PROTEIN ELECTROPHORESIS, SERUM
A/G Ratio: 0.6 — ABNORMAL LOW (ref 0.7–1.7)
Albumin ELP: 3.7 g/dL (ref 2.9–4.4)
Alpha-1-Globulin: 0.2 g/dL (ref 0.0–0.4)
Alpha-2-Globulin: 0.6 g/dL (ref 0.4–1.0)
Beta Globulin: 0.8 g/dL (ref 0.7–1.3)
Gamma Globulin: 4.8 g/dL — ABNORMAL HIGH (ref 0.4–1.8)
Globulin, Total: 6.5 g/dL — ABNORMAL HIGH (ref 2.2–3.9)
M-Spike, %: 4.3 g/dL — ABNORMAL HIGH
Total Protein ELP: 10.2 g/dL — ABNORMAL HIGH (ref 6.0–8.5)

## 2022-01-08 ENCOUNTER — Encounter: Payer: Self-pay | Admitting: Hematology

## 2022-01-08 ENCOUNTER — Inpatient Hospital Stay: Payer: Medicare Other | Admitting: Hematology

## 2022-01-08 VITALS — BP 168/79 | HR 71 | Temp 96.5°F | Resp 16 | Ht 68.0 in | Wt 183.5 lb

## 2022-01-08 DIAGNOSIS — G629 Polyneuropathy, unspecified: Secondary | ICD-10-CM | POA: Diagnosis not present

## 2022-01-08 DIAGNOSIS — D472 Monoclonal gammopathy: Secondary | ICD-10-CM

## 2022-01-08 DIAGNOSIS — Z79899 Other long term (current) drug therapy: Secondary | ICD-10-CM | POA: Diagnosis not present

## 2022-01-08 NOTE — Progress Notes (Signed)
[name] [name], Page [phone]   CLINIC:  Medical Oncology/Hematology  PCP:  [name] Squibb, MD [address] [name] [name] [name] [name] [address]  [phone]  REASON FOR VISIT:  Follow-up for smoldering multiple myeloma  PRIOR THERAPY: none  CURRENT THERAPY: surveillance  INTERVAL HISTORY:  [name] [name], a 76 y.o. male, returns for follow-up of for smoldering myeloma.  Neuropathy in the legs has been stable.  He is taking gabapentin 300 mg 4 times daily.  Denies any new onset pains.  Denies any fevers or infections in the last 2 months.  REVIEW OF SYSTEMS:  Review of Systems  Neurological:  Positive for numbness (Tingling in the feet and legs).  All other systems reviewed and are negative.   PAST MEDICAL/SURGICAL HISTORY:  Past Medical History:  Diagnosis Date   Adverse effect of unspecified drugs, medicaments and biological substances, initial encounter    Essential (primary) hypertension    Hyperlipidemia, unspecified    Impotence of organic origin    Malignant melanoma of skin, unspecified ([place])    Malignant neoplasm of prostate ([place])    Multiple myeloma (HCC)    Neuropathy    Non-pressure chronic ulcer of other part of unspecified foot with unspecified severity (HCC)    Reflux esophagitis    Restless legs syndrome    Tinea unguium    Type 2 diabetes mellitus with diabetic neuropathy, unspecified ([place])    Type 2 diabetes mellitus with other specified complication (HCC)    Unspecified atrial fibrillation (HCC)    Past Surgical History:  Procedure Laterality Date   CATARACT EXTRACTION     HERNIA REPAIR     melanoma removal     PROSTATE SURGERY      SOCIAL HISTORY:  Social History   Socioeconomic History   Marital status: Married    Spouse name: Not on file   Number of children: Not on file   Years of education: Not on file   Highest education level: Not on file  Occupational History   Not on file  Tobacco Use    Smoking status: Never   Smokeless tobacco: Never  Substance and Sexual Activity   Alcohol use: No    Alcohol/week: 0.0 standard drinks of alcohol   Drug use: No   Sexual activity: Not Currently  Other Topics Concern   Not on file  Social History Narrative   Not on file   Social Determinants of Health   Financial Resource Strain: Low Risk  (05/13/2020)   Overall Financial Resource Strain (CARDIA)    Difficulty of Paying Living Expenses: Not hard at all  Food Insecurity: No Food Insecurity (05/13/2020)   Hunger Vital Sign    Worried About Running Out of Food in the Last Year: Never true    Ran Out of Food in the Last Year: Never true  Transportation Needs: No Transportation Needs (05/13/2020)   PRAPARE - Hydrologist (Medical): No    Lack of Transportation (Non-Medical): No  Physical Activity: Insufficiently Active (05/13/2020)   Exercise Vital Sign    Days of Exercise per Week: 3 days    Minutes of Exercise per Session: 20 min  Stress: No Stress Concern Present (05/13/2020)   [place]    Feeling of Stress : Not at all  Social Connections: Moderately Integrated (05/13/2020)   Social Connection and Isolation Panel [NHANES]  Frequency of Communication with Friends and Family: Twice a week    Frequency of Social Gatherings with Friends and Family: Twice a week    Attends Religious Services: 1 to 4 times per year    Active Member of Clubs or Organizations: No    Attends Club or Organization Meetings: Never    Marital Status: Married  Intimate Partner Violence: Not At Risk (05/13/2020)   Humiliation, Afraid, Rape, and Kick questionnaire    Fear of Current or Ex-Partner: No    Emotionally Abused: No    Physically Abused: No    Sexually Abused: No    FAMILY HISTORY:  No family history on file.  CURRENT MEDICATIONS:  Current Outpatient Medications  Medication Sig Dispense Refill    Accu-Chek FastClix Lancets MISC Apply topically 2 (two) times daily.     atenolol-chlorthalidone (TENORETIC) 50-25 MG per tablet Take 1 tablet by mouth daily. Takes 1/2 daily per Dr hawkins     diclofenac sodium (VOLTAREN) 1 % GEL Apply 2 g topically daily as needed.     gabapentin (NEURONTIN) 300 MG capsule Take 300 mg by mouth every 4 (four) hours.     loperamide (IMODIUM A-D) 2 MG tablet Take 2 mg by mouth 4 (four) times daily as needed for diarrhea or loose stools.     losartan (COZAAR) 50 MG tablet Take 50 mg by mouth daily.     metFORMIN (GLUCOPHAGE) 500 MG tablet Take 500 mg by mouth 2 (two) times daily with a meal.     pantoprazole (PROTONIX) 40 MG tablet Take 40 mg by mouth daily.     silver sulfADIAZINE (SILVADENE) 1 % cream Apply topically.     No current facility-administered medications for this visit.    ALLERGIES:  Allergies  Allergen Reactions   Penicillins Rash    Also swelling reaction    Gadavist [Gadobutrol] Nausea Only    Patient became nauseated immediately after contrast injection.  Was better after a few minutes.     PHYSICAL EXAM:  Performance status (ECOG): 1 - Symptomatic but completely ambulatory  Vitals:   01/08/22 1418  BP: (!) 168/79  Pulse: 71  Resp: 16  Temp: (!) 96.5 F (35.8 C)  SpO2: 97%   Wt Readings from Last 3 Encounters:  01/08/22 183 lb 8 oz (83.2 kg)  10/26/21 186 lb 11.7 oz (84.7 kg)  10/11/21 184 lb 8 oz (83.7 kg)   Physical Exam Vitals reviewed.  Constitutional:      Appearance: Normal appearance.  Cardiovascular:     Rate and Rhythm: Normal rate and regular rhythm.     Pulses: Normal pulses.     Heart sounds: Normal heart sounds.  Pulmonary:     Effort: Pulmonary effort is normal.     Breath sounds: Normal breath sounds.  Neurological:     General: No focal deficit present.     Mental Status: He is alert and oriented to person, place, and time.  Psychiatric:        Mood and Affect: Mood normal.        Behavior:  Behavior normal.     LABORATORY DATA:  I have reviewed the labs as listed.     Latest Ref Rng & Units 01/01/2022    1:20 PM 10/04/2021    8:24 AM 06/13/2021    1:14 PM  CBC  WBC 4.0 - 10.5 K/uL 7.7  8.0  9.6   Hemoglobin 13.0 - 17.0 g/dL 11.7  13.4  13.9     Hematocrit 39.0 - 52.0 % 33.2  38.0  39.8   Platelets 150 - 400 K/uL 299  254  270       Latest Ref Rng & Units 01/01/2022    1:20 PM 10/04/2021    8:24 AM 06/13/2021    1:14 PM  CMP  Glucose 70 - 99 mg/dL 133  150  146   BUN 8 - 23 mg/dL 26  21  19   Creatinine 0.61 - 1.24 mg/dL 1.49  1.25  1.20   Sodium 135 - 145 mmol/L 134  132  132   Potassium 3.5 - 5.1 mmol/L 3.2  3.2  3.6   Chloride 98 - 111 mmol/L 102  99  97   CO2 22 - 32 mmol/L 24  24  25   Calcium 8.9 - 10.3 mg/dL 8.6  8.5  8.9   Total Protein 6.5 - 8.1 g/dL 10.7  10.5  10.4   Total Bilirubin 0.3 - 1.2 mg/dL 1.1  1.0  0.9   Alkaline Phos 38 - 126 U/L 74  59  74   AST 15 - 41 U/L 15  13  14   ALT 0 - 44 U/L 11  10  13       Component Value Date/Time   RBC 3.34 (L) 01/01/2022 1320   MCV 99.4 01/01/2022 1320   MCH 35.0 (H) 01/01/2022 1320   MCHC 35.2 01/01/2022 1320   RDW 15.0 01/01/2022 1320   LYMPHSABS 2.0 01/01/2022 1320   MONOABS 0.8 01/01/2022 1320   EOSABS 0.4 01/01/2022 1320   BASOSABS 0.1 01/01/2022 1320    DIAGNOSTIC IMAGING:  I have independently reviewed the scans and discussed with the patient. No results found.   ASSESSMENT:  IgG kappa smoldering multiple myeloma: - Skeletal survey in February 2022 was negative. - Bone marrow biopsy on 06/06/2020 with 20% plasma cells.  Myeloma FISH panel with monosomy 13/deletion 13.  Chromosome analysis 46, XY (20). - No "crab" features. - PET scan from 06/29/2021: No evidence of myeloma or plasmacytoma. - Skeletal survey on 06/13/2021: No lytic lesions. - MRI of the cervical/thoracic/lumbar spine negative for bone lesions with some degenerative changes.   2.  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 - No major chemical exposures.  He had personal history of melanoma of the upper lip. - Father had acute myeloid leukemia.  Mother had multiple myeloma.  Son died of acute lymphocytic leukemia   PLAN:  1.  IgG kappa smoldering multiple myeloma: - He does not report any new onset bone pains.  Neuropathy in the legs has been stable. - Reviewed labs from 01/01/2022 which showed worsening of creatinine to 1.49 from 1.25 previously.  Calcium is 8.6 with albumin 2.9.  Total protein is continuing to get worse.  M spike is stable at 4.3 g.  We have also reviewed prior labs and scans. - His hemoglobin has decreased 11.7 from 13.4 in July.  Kappa light chains increased to 592 from 570.  Ratio is 49. - He is having gradual worsening of kappa light chains and decrease in hemoglobin.  He is highly likely to progress to myeloma in the next few months.  We have briefly talked about initiating him on myeloma therapy to prevent any endorgan damage like renal failure as his creatinine is gradually worsening.  He is agreeable to start therapy.  He has very bad peripheral neuropathy in the legs with pains.    Based on his comorbidities, I do not believe he is a candidate for bone marrow transplant.  We will consider it if there are any high risk features on the [facility] panel or chromosomes.  We will consider DRD regimen if standard risk.  We also discussed side effects of the regimen briefly. - Recommend bone marrow biopsy with cytogenetics and myeloma FISH panel. - RTC 4 weeks to talk about biopsy results.   2.  Peripheral neuropathy: - He has pain in the legs and feet with numbness in the hands. - Continue gabapentin 300 mg 4 times daily.  Orders placed this encounter:  Orders Placed This Encounter  Procedures   CT BONE MARROW BIOPSY & ASPIRATION   CBC with Differential/Platelet   Comprehensive metabolic panel   Kappa/lambda light chains      Derek Jack, MD [place] [phone]

## 2022-01-08 NOTE — Patient Instructions (Addendum)
[place]  Discharge Instructions  You were seen and examined today by Dr. Delton Coombes.  Dr. Delton Coombes discussed your most recent lab work which revealed that your myeloma labs and your kidney functions are elevated.   Be sure that you are drinking 2-3 liters of water daily to help your kidney functions. Dr. Delton Coombes recommends that you have a bone marrow biopsy.  Follow-up as scheduled in 4 weeks with labs.    Thank you for choosing [facility] to provide your oncology and hematology care.   To afford each patient quality time with our provider, please arrive at least 15 minutes before your scheduled appointment time. You may need to reschedule your appointment if you arrive late (10 or more minutes). Arriving late affects you and other patients whose appointments are after yours.  Also, if you miss three or more appointments without notifying the office, you may be dismissed from the clinic at the provider's discretion.    Again, thank you for choosing [facility].  Our hope is that these requests will decrease the amount of time that you wait before being seen by our physicians.   If you have a lab appointment with the [facility] please come in thru the Main Entrance and check in at the main information desk.           _____________________________________________________________  Should you have questions after your visit to [facility], please contact our office at [phone] and follow the prompts.  Our office hours are 8:00 a.m. to 4:30 p.m. Monday - Thursday and 8:00 a.m. to 2:30 p.m. Friday.  Please note that voicemails left after 4:00 p.m. may not be returned until the following business day.  We are closed weekends and all major holidays.  You do have access to a nurse 24-7, just call the main number to the clinic [phone] and do not press any options, hold on the line and a nurse will  answer the phone.    For prescription refill requests, have your pharmacy contact our office and allow 72 hours.    Masks are optional in the cancer centers. If you would like for your care team to wear a mask while they are taking care of you, please let them know. You may have one support person who is at least 77 years old accompany you for your appointments.

## 2022-01-11 DIAGNOSIS — L97511 Non-pressure chronic ulcer of other part of right foot limited to breakdown of skin: Secondary | ICD-10-CM | POA: Diagnosis not present

## 2022-01-25 DIAGNOSIS — L97511 Non-pressure chronic ulcer of other part of right foot limited to breakdown of skin: Secondary | ICD-10-CM | POA: Diagnosis not present

## 2022-01-30 DIAGNOSIS — E1140 Type 2 diabetes mellitus with diabetic neuropathy, unspecified: Secondary | ICD-10-CM | POA: Diagnosis not present

## 2022-01-30 DIAGNOSIS — E782 Mixed hyperlipidemia: Secondary | ICD-10-CM | POA: Diagnosis not present

## 2022-01-31 ENCOUNTER — Other Ambulatory Visit: Payer: Self-pay | Admitting: Internal Medicine

## 2022-01-31 ENCOUNTER — Other Ambulatory Visit (HOSPITAL_COMMUNITY): Payer: Self-pay | Admitting: Physician Assistant

## 2022-01-31 DIAGNOSIS — Z01818 Encounter for other preprocedural examination: Secondary | ICD-10-CM

## 2022-02-01 ENCOUNTER — Encounter (HOSPITAL_COMMUNITY): Payer: Self-pay

## 2022-02-01 ENCOUNTER — Ambulatory Visit (HOSPITAL_COMMUNITY)
Admission: RE | Admit: 2022-02-01 | Discharge: 2022-02-01 | Disposition: A | Discharge status: Home / Self Care | Payer: Medicare Other | Source: Ambulatory Visit | Attending: Hematology | Admitting: Hematology

## 2022-02-01 DIAGNOSIS — C9000 Multiple myeloma not having achieved remission: Secondary | ICD-10-CM | POA: Diagnosis not present

## 2022-02-01 DIAGNOSIS — D472 Monoclonal gammopathy: Secondary | ICD-10-CM | POA: Insufficient documentation

## 2022-02-01 DIAGNOSIS — D630 Anemia in neoplastic disease: Secondary | ICD-10-CM | POA: Diagnosis not present

## 2022-02-01 DIAGNOSIS — Z1379 Encounter for other screening for genetic and chromosomal anomalies: Secondary | ICD-10-CM | POA: Diagnosis not present

## 2022-02-01 DIAGNOSIS — D4989 Neoplasm of unspecified behavior of other specified sites: Secondary | ICD-10-CM | POA: Diagnosis not present

## 2022-02-01 DIAGNOSIS — D721 Eosinophilia, unspecified: Secondary | ICD-10-CM | POA: Insufficient documentation

## 2022-02-01 DIAGNOSIS — Z01818 Encounter for other preprocedural examination: Secondary | ICD-10-CM

## 2022-02-01 DIAGNOSIS — D539 Nutritional anemia, unspecified: Secondary | ICD-10-CM | POA: Insufficient documentation

## 2022-02-01 LAB — CBC WITH DIFFERENTIAL/PLATELET
Abs Immature Granulocytes: 0.02 10*3/uL (ref 0.00–0.07)
Basophils Absolute: 0.1 10*3/uL (ref 0.0–0.1)
Basophils Relative: 1 %
Eosinophils Absolute: 0.6 10*3/uL — ABNORMAL HIGH (ref 0.0–0.5)
Eosinophils Relative: 7 %
HCT: 35.4 % — ABNORMAL LOW (ref 39.0–52.0)
Hemoglobin: 12.3 g/dL — ABNORMAL LOW (ref 13.0–17.0)
Immature Granulocytes: 0 %
Lymphocytes Relative: 26 %
Lymphs Abs: 2 10*3/uL (ref 0.7–4.0)
MCH: 35.5 pg — ABNORMAL HIGH (ref 26.0–34.0)
MCHC: 34.7 g/dL (ref 30.0–36.0)
MCV: 102.3 fL — ABNORMAL HIGH (ref 80.0–100.0)
Monocytes Absolute: 0.7 10*3/uL (ref 0.1–1.0)
Monocytes Relative: 8 %
Neutro Abs: 4.6 10*3/uL (ref 1.7–7.7)
Neutrophils Relative %: 58 %
Platelets: 233 10*3/uL (ref 150–400)
RBC: 3.46 MIL/uL — ABNORMAL LOW (ref 4.22–5.81)
RDW: 15.9 % — ABNORMAL HIGH (ref 11.5–15.5)
WBC: 7.9 10*3/uL (ref 4.0–10.5)
nRBC: 0 % (ref 0.0–0.2)

## 2022-02-01 LAB — GLUCOSE, CAPILLARY: Glucose-Capillary: 149 mg/dL — ABNORMAL HIGH (ref 70–99)

## 2022-02-01 MED ORDER — SODIUM CHLORIDE 0.9 % IV SOLN: INTRAVENOUS | Status: DC

## 2022-02-01 MED ORDER — FLUMAZENIL 0.5 MG/5ML IV SOLN
INTRAVENOUS | Status: AC
  Filled 2022-02-01: qty 5

## 2022-02-01 MED ORDER — MIDAZOLAM HCL 2 MG/2ML IJ SOLN
INTRAMUSCULAR | Status: AC
  Filled 2022-02-01: qty 2

## 2022-02-01 MED ORDER — NALOXONE HCL 0.4 MG/ML IJ SOLN
INTRAMUSCULAR | Status: AC
  Filled 2022-02-01: qty 1

## 2022-02-01 MED ORDER — MIDAZOLAM HCL 2 MG/2ML IJ SOLN
INTRAMUSCULAR | Status: AC | PRN
  Administered 2022-02-01: 1 mg via INTRAVENOUS

## 2022-02-01 MED ORDER — LIDOCAINE HCL 1 % IJ SOLN
INTRAMUSCULAR | Status: AC | PRN
  Administered 2022-02-01: 10 mL via INTRADERMAL

## 2022-02-01 MED ORDER — FENTANYL CITRATE (PF) 100 MCG/2ML IJ SOLN
INTRAMUSCULAR | Status: AC | PRN
  Administered 2022-02-01: 50 ug via INTRAVENOUS

## 2022-02-01 MED ORDER — FENTANYL CITRATE (PF) 100 MCG/2ML IJ SOLN
INTRAMUSCULAR | Status: AC
  Filled 2022-02-01: qty 2

## 2022-02-01 NOTE — H&P (Signed)
Chief Complaint: Patient was seen in consultation today for bone marrow biopsy   Referring Physician(s): Katragadda,Sreedhar  Supervising Physician: Ruthann Cancer  Patient Status: [facility] - Out-pt  History of Present Illness: [name] [name] is a 77 y.o. male with hx of myeloma/MGUS. He is here today for bone marrow biopsy. PMHx, meds, labs, imaging, allergies reviewed. Feels well, no recent fevers, chills, illness. Has been NPO today as directed.    Past Medical History:  Diagnosis Date   Adverse effect of unspecified drugs, medicaments and biological substances, initial encounter    Essential (primary) hypertension    Hyperlipidemia, unspecified    Impotence of organic origin    Malignant melanoma of skin, unspecified ([place])    Malignant neoplasm of prostate ([place])    Multiple myeloma ([place])    Neuropathy    Non-pressure chronic ulcer of other part of unspecified foot with unspecified severity (HCC)    Reflux esophagitis    Restless legs syndrome    Tinea unguium    Type 2 diabetes mellitus with diabetic neuropathy, unspecified ([place])    Type 2 diabetes mellitus with other specified complication (HCC)    Unspecified atrial fibrillation (HCC)     Past Surgical History:  Procedure Laterality Date   CATARACT EXTRACTION     HERNIA REPAIR     melanoma removal     PROSTATE SURGERY      Allergies: Penicillins and Gadavist [gadobutrol]  Medications: Prior to Admission medications   Medication Sig Start Date End Date Taking? Authorizing Provider  atenolol-chlorthalidone (TENORETIC) 50-25 MG per tablet Take 1 tablet by mouth daily. Takes 1/2 daily per Dr Luan Pulling   Yes [provider]  diclofenac sodium (VOLTAREN) 1 % GEL Apply 2 g topically daily as needed.   Yes [provider]  gabapentin (NEURONTIN) 300 MG capsule Take 300 mg by mouth every 4 (four) hours.   Yes [provider]  loperamide (IMODIUM A-D) 2 MG tablet Take 2 mg by mouth 4  (four) times daily as needed for diarrhea or loose stools.   Yes [provider]  losartan (COZAAR) 50 MG tablet Take 50 mg by mouth daily. 06/16/21  Yes [provider]  metFORMIN (GLUCOPHAGE) 500 MG tablet Take 500 mg by mouth 2 (two) times daily with a meal.   Yes [provider]  pantoprazole (PROTONIX) 40 MG tablet Take 40 mg by mouth daily.   Yes [provider]  Accu-Chek FastClix Lancets MISC Apply topically 2 (two) times daily. 08/17/21   [provider]  silver sulfADIAZINE (SILVADENE) 1 % cream Apply topically. 06/15/21   [provider]     No family history on file.  Social History   Socioeconomic History   Marital status: Married    Spouse name: Not on file   Number of children: Not on file   Years of education: Not on file   Highest education level: Not on file  Occupational History   Not on file  Tobacco Use   Smoking status: Never   Smokeless tobacco: Never  Substance and Sexual Activity   Alcohol use: No    Alcohol/week: 0.0 standard drinks of alcohol   Drug use: No   Sexual activity: Not Currently  Other Topics Concern   Not on file  Social History Narrative   Not on file   Social Determinants of Health   Financial Resource Strain: Low Risk  (05/13/2020)   Overall Financial Resource Strain (CARDIA)    Difficulty of  Paying Living Expenses: Not hard at all  Food Insecurity: No Food Insecurity (05/13/2020)   Hunger Vital Sign    Worried About Running Out of Food in the Last Year: Never true    Ran Out of Food in the Last Year: Never true  Transportation Needs: No Transportation Needs (05/13/2020)   PRAPARE - Hydrologist (Medical): No    Lack of Transportation (Non-Medical): No  Physical Activity: Insufficiently Active (05/13/2020)   Exercise Vital Sign    Days of Exercise per Week: 3 days    Minutes of Exercise per Session: 20 min  Stress: No Stress Concern Present (05/13/2020)    [place]    Feeling of Stress : Not at all  Social Connections: Moderately Integrated (05/13/2020)   Social Connection and Isolation Panel [NHANES]    Frequency of Communication with Friends and Family: Twice a week    Frequency of Social Gatherings with Friends and Family: Twice a week    Attends Religious Services: 1 to 4 times per year    Active Member of [employer] or Organizations: No    Attends Archivist Meetings: Never    Marital Status: Married    Review of Systems: A 12 point ROS discussed and pertinent positives are indicated in the HPI above.  All other systems are negative.  Review of Systems  Vital Signs: BP (!) 147/99   Pulse 74   Temp (!) 97.4 F (36.3 C) (Oral)   Resp 18   SpO2 96%   Physical Exam Constitutional:      Appearance: He is not ill-appearing.  HENT:     Mouth/Throat:     Mouth: Mucous membranes are moist.     Pharynx: Oropharynx is clear.  Cardiovascular:     Rate and Rhythm: Normal rate and regular rhythm.     Heart sounds: Normal heart sounds.  Pulmonary:     Effort: Pulmonary effort is normal. No respiratory distress.     Breath sounds: Normal breath sounds.  Neurological:     General: No focal deficit present.     Mental Status: He is alert and oriented to person, place, and time.  Psychiatric:        Mood and Affect: Mood normal.        Thought Content: Thought content normal.     Imaging: No results found.  Labs:  CBC: Recent Labs    02/07/21 0848 06/13/21 1314 10/04/21 0824 01/01/22 1320  WBC 9.1 9.6 8.0 7.7  HGB 14.0 13.9 13.4 11.7*  HCT 40.0 39.8 38.0* 33.2*  PLT 245 270 254 299    COAGS: No results for input(s): "INR", "APTT" in the last 8760 hours.  BMP: Recent Labs    02/14/21 1425 06/13/21 1314 10/04/21 0824 01/01/22 1320  NA 133* 132* 132* 134*  K 3.2* 3.6 3.2* 3.2*  CL 100 97* 99 102  CO2 _0 GLUCOSE 200* 146*  150* 133*  BUN _1 26*  CALCIUM 8.8* 8.9 8.5* 8.6*  CREATININE 1.14 1.20 1.25* 1.49*  GFRNONAA >60 >60 60* 48*    LIVER FUNCTION TESTS: Recent Labs    02/14/21 1425 06/13/21 1314 10/04/21 0824 01/01/22 1320  BILITOT 0.8 0.9 1.0 1.1  AST 16 14* 13* 15  ALT _2 ALKPHOS 69 74 59 74  PROT 9.6* 10.4* 10.5* 10.7*  ALBUMIN 3.3* 3.3* 3.0* 2.9*  Assessment and Plan: Myeloma/MGUS For CT bone marrow bx Risks and benefits of bone marrow bx was discussed with the patient and/or patient's family including, but not limited to bleeding, infection, damage to adjacent structures or low yield requiring additional tests.  All of the questions were answered and there is agreement to proceed.  Consent signed and in chart.    Electronically Signed: Ascencion Dike, PA-C 02/01/2022, 8:09 AM   I spent a total of 20 in face to face in clinical consultation, greater than 50% of which was counseling/coordinating care for bone marrow bx

## 2022-02-01 NOTE — Procedures (Signed)
Interventional Radiology Procedure Note  Procedure: CT guided aspirate and core biopsy of right iliac bone  Complications: None  Recommendations: - Bedrest supine x 1 hrs - Hydrocodone PRN  Pain - Follow biopsy results   Cleophas Yoak, MD   

## 2022-02-05 LAB — SURGICAL PATHOLOGY

## 2022-02-06 DIAGNOSIS — C9000 Multiple myeloma not having achieved remission: Secondary | ICD-10-CM | POA: Diagnosis not present

## 2022-02-06 DIAGNOSIS — E1122 Type 2 diabetes mellitus with diabetic chronic kidney disease: Secondary | ICD-10-CM | POA: Diagnosis not present

## 2022-02-06 DIAGNOSIS — G3184 Mild cognitive impairment, so stated: Secondary | ICD-10-CM | POA: Diagnosis not present

## 2022-02-06 DIAGNOSIS — C439 Malignant melanoma of skin, unspecified: Secondary | ICD-10-CM | POA: Diagnosis not present

## 2022-02-06 DIAGNOSIS — I10 Essential (primary) hypertension: Secondary | ICD-10-CM | POA: Diagnosis not present

## 2022-02-06 DIAGNOSIS — K219 Gastro-esophageal reflux disease without esophagitis: Secondary | ICD-10-CM | POA: Diagnosis not present

## 2022-02-06 DIAGNOSIS — L97512 Non-pressure chronic ulcer of other part of right foot with fat layer exposed: Secondary | ICD-10-CM | POA: Diagnosis not present

## 2022-02-06 DIAGNOSIS — E1140 Type 2 diabetes mellitus with diabetic neuropathy, unspecified: Secondary | ICD-10-CM | POA: Diagnosis not present

## 2022-02-06 DIAGNOSIS — N1831 Chronic kidney disease, stage 3a: Secondary | ICD-10-CM | POA: Diagnosis not present

## 2022-02-06 DIAGNOSIS — E782 Mixed hyperlipidemia: Secondary | ICD-10-CM | POA: Diagnosis not present

## 2022-02-06 DIAGNOSIS — G720 Drug-induced myopathy: Secondary | ICD-10-CM | POA: Diagnosis not present

## 2022-02-07 ENCOUNTER — Encounter (HOSPITAL_COMMUNITY): Payer: Self-pay | Admitting: Hematology

## 2022-02-12 ENCOUNTER — Encounter (HOSPITAL_COMMUNITY): Payer: Self-pay | Admitting: Hematology

## 2022-02-13 ENCOUNTER — Inpatient Hospital Stay: Payer: Medicare Other | Attending: Hematology

## 2022-02-13 DIAGNOSIS — Z79899 Other long term (current) drug therapy: Secondary | ICD-10-CM | POA: Insufficient documentation

## 2022-02-13 DIAGNOSIS — G629 Polyneuropathy, unspecified: Secondary | ICD-10-CM | POA: Diagnosis not present

## 2022-02-13 DIAGNOSIS — D472 Monoclonal gammopathy: Secondary | ICD-10-CM | POA: Diagnosis not present

## 2022-02-14 LAB — KAPPA/LAMBDA LIGHT CHAINS
Kappa free light chain: 505.1 mg/L — ABNORMAL HIGH (ref 3.3–19.4)
Kappa, lambda light chain ratio: 55.51 — ABNORMAL HIGH (ref 0.26–1.65)
Lambda free light chains: 9.1 mg/L (ref 5.7–26.3)

## 2022-02-15 DIAGNOSIS — L97511 Non-pressure chronic ulcer of other part of right foot limited to breakdown of skin: Secondary | ICD-10-CM | POA: Diagnosis not present

## 2022-02-20 ENCOUNTER — Inpatient Hospital Stay: Payer: Medicare Other | Admitting: Hematology

## 2022-02-20 VITALS — BP 181/93 | HR 85 | Temp 96.1°F | Resp 18 | Ht 67.52 in | Wt 185.4 lb

## 2022-02-20 DIAGNOSIS — Z79899 Other long term (current) drug therapy: Secondary | ICD-10-CM | POA: Diagnosis not present

## 2022-02-20 DIAGNOSIS — D472 Monoclonal gammopathy: Secondary | ICD-10-CM | POA: Diagnosis not present

## 2022-02-20 DIAGNOSIS — Z7984 Long term (current) use of oral hypoglycemic drugs: Secondary | ICD-10-CM | POA: Diagnosis not present

## 2022-02-20 DIAGNOSIS — G629 Polyneuropathy, unspecified: Secondary | ICD-10-CM | POA: Diagnosis not present

## 2022-02-20 NOTE — Patient Instructions (Addendum)
[facility] [facility]  Discharge Instructions  You were seen and examined today by Dr. Delton Coombes.  Dr. Delton Coombes discussed your most recent lab work and bone marrow biopsy which revealed that you have 35% myeloma plasma cells in your bone marrow previously it was 20% in March 2022. Your kidney function has gotten slightly worse and your hemoglobin has decreased slightly. This is considered a Smoldering Myeloma meaning that it is heading toward multiple myeloma.   Dr. Delton Coombes is recommending having a follow up and see how your labs are looking. In the mean time if you have worsening symptoms call our office to get a sooner appointment. You can decrease the Lyrica to twice daily.  Follow-up as scheduled in 6 weeks with labs and PET scan prior.    Thank you for choosing [facility] to provide your oncology and hematology care.   To afford each patient quality time with our provider, please arrive at least 15 minutes before your scheduled appointment time. You may need to reschedule your appointment if you arrive late (10 or more minutes). Arriving late affects you and other patients whose appointments are after yours.  Also, if you miss three or more appointments without notifying the office, you may be dismissed from the clinic at the provider's discretion.    Again, thank you for choosing [facility].  Our hope is that these requests will decrease the amount of time that you wait before being seen by our physicians.   If you have a lab appointment with the [facility] please come in thru the Main Entrance and check in at the main information desk.           _____________________________________________________________  Should you have questions after your visit to [facility], please contact our office at [phone] and follow the prompts.  Our office hours are 8:00 a.m. to 4:30 p.m. Monday - Thursday and 8:00  a.m. to 2:30 p.m. Friday.  Please note that voicemails left after 4:00 p.m. may not be returned until the following business day.  We are closed weekends and all major holidays.  You do have access to a nurse 24-7, just call the main number to the clinic [phone] and do not press any options, hold on the line and a nurse will answer the phone.    For prescription refill requests, have your pharmacy contact our office and allow 72 hours.    Masks are optional in the cancer centers. If you would like for your care team to wear a mask while they are taking care of you, please let them know. You may have one support person who is at least 77 years old accompany you for your appointments.

## 2022-02-20 NOTE — Progress Notes (Signed)
[place], [place] [address]   CLINIC:  Medical Oncology/Hematology  PCP:  Celene Squibb, MD [address] [place] [address]  [phone]  REASON FOR VISIT:  Follow-up for smoldering multiple myeloma  PRIOR THERAPY: none  CURRENT THERAPY: surveillance  INTERVAL HISTORY:  [name], a 76 y.o. male, seen for follow-up of smoldering myeloma.  Neuropathy symptoms have been stable.  He was reportedly switched from gabapentin to Lyrica 100 mg 3 times daily 1 week ago.  He had a bone marrow biopsy done.  Denies any new onset bone pains.  REVIEW OF SYSTEMS:  Review of Systems  Respiratory:  Positive for shortness of breath.   Neurological:  Positive for numbness (Tingling in the feet and legs).  All other systems reviewed and are negative.   PAST MEDICAL/SURGICAL HISTORY:  Past Medical History:  Diagnosis Date   Adverse effect of unspecified drugs, medicaments and biological substances, initial encounter    Essential (primary) hypertension    Hyperlipidemia, unspecified    Impotence of organic origin    Malignant melanoma of skin, unspecified ([place])    Malignant neoplasm of prostate ([place])    Multiple myeloma (HCC)    Neuropathy    Non-pressure chronic ulcer of other part of unspecified foot with unspecified severity (HCC)    Reflux esophagitis    Restless legs syndrome    Tinea unguium    Type 2 diabetes mellitus with diabetic neuropathy, unspecified ([place])    Type 2 diabetes mellitus with other specified complication (HCC)    Unspecified atrial fibrillation (HCC)    Past Surgical History:  Procedure Laterality Date   CATARACT EXTRACTION     HERNIA REPAIR     melanoma removal     PROSTATE SURGERY      SOCIAL HISTORY:  Social History   Socioeconomic History   Marital status: Married    Spouse name: Not on file   Number of children: Not on file   Years of education: Not on file   Highest education level:  Not on file  Occupational History   Not on file  Tobacco Use   Smoking status: Never   Smokeless tobacco: Never  Vaping Use   Vaping Use: Never used  Substance and Sexual Activity   Alcohol use: No    Alcohol/week: 0.0 standard drinks of alcohol   Drug use: No   Sexual activity: Not Currently  Other Topics Concern   Not on file  Social History Narrative   Not on file   Social Determinants of Health   Financial Resource Strain: Low Risk  (05/13/2020)   Overall Financial Resource Strain (CARDIA)    Difficulty of Paying Living Expenses: Not hard at all  Food Insecurity: No Food Insecurity (05/13/2020)   Hunger Vital Sign    Worried About Running Out of Food in the Last Year: Never true    Ran Out of Food in the Last Year: Never true  Transportation Needs: No Transportation Needs (05/13/2020)   PRAPARE - Hydrologist (Medical): No    Lack of Transportation (Non-Medical): No  Physical Activity: Insufficiently Active (05/13/2020)   Exercise Vital Sign    Days of Exercise per Week: 3 days    Minutes of Exercise per Session: 20 min  Stress: No Stress Concern Present (05/13/2020)   [place]    Feeling of Stress :  Not at all  Social Connections: Moderately Integrated (05/13/2020)   Social Connection and Isolation Panel [NHANES]    Frequency of Communication with Friends and Family: Twice a week    Frequency of Social Gatherings with Friends and Family: Twice a week    Attends Religious Services: 1 to 4 times per year    Active Member of [employer] or Organizations: No    Attends Archivist Meetings: Never    Marital Status: Married  Human resources officer Violence: Not At Risk (05/13/2020)   Humiliation, Afraid, Rape, and Kick questionnaire    Fear of Current or Ex-Partner: No    Emotionally Abused: No    Physically Abused: No    Sexually Abused: No    FAMILY HISTORY:  No family  history on file.  CURRENT MEDICATIONS:  Current Outpatient Medications  Medication Sig Dispense Refill   Accu-Chek FastClix Lancets MISC Apply topically 2 (two) times daily.     atenolol-chlorthalidone (TENORETIC) 50-25 MG per tablet Take 1 tablet by mouth daily. Takes 1/2 daily per Dr Luan Pulling     diclofenac sodium (VOLTAREN) 1 % GEL Apply 2 g topically daily as needed.     loperamide (IMODIUM A-D) 2 MG tablet Take 2 mg by mouth 4 (four) times daily as needed for diarrhea or loose stools.     losartan (COZAAR) 50 MG tablet Take 50 mg by mouth daily.     metFORMIN (GLUCOPHAGE) 500 MG tablet Take 500 mg by mouth 2 (two) times daily with a meal.     pantoprazole (PROTONIX) 40 MG tablet Take 40 mg by mouth daily.     pregabalin (LYRICA) 100 MG capsule Take 100 mg by mouth 3 (three) times daily.     silver sulfADIAZINE (SILVADENE) 1 % cream Apply topically.     No current facility-administered medications for this visit.    ALLERGIES:  Allergies  Allergen Reactions   Penicillins Rash    Also swelling reaction    Gadavist [Gadobutrol] Nausea Only    Patient became nauseated immediately after contrast injection.  Was better after a few minutes.     PHYSICAL EXAM:  Performance status (ECOG): 1 - Symptomatic but completely ambulatory  Vitals:   02/20/22 1516  BP: (!) 181/93  Pulse: 85  Resp: 18  Temp: (!) 96.1 F (35.6 C)  SpO2: 100%   Wt Readings from Last 3 Encounters:  02/20/22 185 lb 6.4 oz (84.1 kg)  02/01/22 183 lb (83 kg)  01/08/22 183 lb 8 oz (83.2 kg)   Physical Exam Vitals reviewed.  Constitutional:      Appearance: Normal appearance.  Cardiovascular:     Rate and Rhythm: Normal rate and regular rhythm.     Pulses: Normal pulses.     Heart sounds: Normal heart sounds.  Pulmonary:     Effort: Pulmonary effort is normal.     Breath sounds: Normal breath sounds.  Neurological:     General: No focal deficit present.     Mental Status: He is alert and oriented to  person, place, and time.  Psychiatric:        Mood and Affect: Mood normal.        Behavior: Behavior normal.     LABORATORY DATA:  I have reviewed the labs as listed.     Latest Ref Rng & Units 02/01/2022    8:20 AM 01/01/2022    1:20 PM 10/04/2021    8:24 AM  CBC  WBC 4.0 - 10.5 K/uL 7.9  7.7  8.0   Hemoglobin 13.0 - 17.0 g/dL 12.3  11.7  13.4   Hematocrit 39.0 - 52.0 % 35.4  33.2  38.0   Platelets 150 - 400 K/uL 233  299  254       Latest Ref Rng & Units 01/01/2022    1:20 PM 10/04/2021    8:24 AM 06/13/2021    1:14 PM  CMP  Glucose 70 - 99 mg/dL 133  150  146   BUN 8 - 23 mg/dL _0 Creatinine 0.61 - 1.24 mg/dL 1.49  1.25  1.20   Sodium 135 - 145 mmol/L 134  132  132   Potassium 3.5 - 5.1 mmol/L 3.2  3.2  3.6   Chloride 98 - 111 mmol/L 102  99  97   CO2 22 - 32 mmol/L _1 Calcium 8.9 - 10.3 mg/dL 8.6  8.5  8.9   Total Protein 6.5 - 8.1 g/dL 10.7  10.5  10.4   Total Bilirubin 0.3 - 1.2 mg/dL 1.1  1.0  0.9   Alkaline Phos 38 - 126 U/L 74  59  74   AST 15 - 41 U/L _2 ALT 0 - 44 U/L _3 Component Value Date/Time   RBC 3.46 (L) 02/01/2022 0820   MCV 102.3 (H) 02/01/2022 0820   MCH 35.5 (H) 02/01/2022 0820   MCHC 34.7 02/01/2022 0820   RDW 15.9 (H) 02/01/2022 0820   LYMPHSABS 2.0 02/01/2022 0820   MONOABS 0.7 02/01/2022 0820   EOSABS 0.6 (H) 02/01/2022 0820   BASOSABS 0.1 02/01/2022 0820    DIAGNOSTIC IMAGING:  I have independently reviewed the scans and discussed with the patient. CT BONE MARROW BIOPSY & ASPIRATION  Result Date: 02/01/2022 INDICATION: 76 year old male with history of monoclonal gammopathy of undetermined significance. EXAM: CT-GUIDED BONE MARROW BIOPSY AND ASPIRATION MEDICATIONS: None ANESTHESIA/SEDATION: Fentanyl 100 mcg IV; Versed 2 mg IV Sedation Time: 10 minutes; The patient was continuously monitored during the procedure by the interventional radiology nurse under my direct supervision. COMPLICATIONS: None  immediate. PROCEDURE: Informed consent was obtained from the patient following an explanation of the procedure, risks, benefits and alternatives. The patient understands, agrees and consents for the procedure. All questions were addressed. A time out was performed prior to the initiation of the procedure. The patient was positioned prone and non-contrast localization CT was performed of the pelvis to demonstrate the iliac marrow spaces. The operative site was prepped and draped in the usual sterile fashion. Under sterile conditions and local anesthesia, a 22 gauge spinal needle was utilized for procedural planning. Next, an 11 gauge coaxial bone biopsy needle was advanced into the right iliac marrow space. Needle position was confirmed with CT imaging. Initially, a bone marrow aspiration was performed. Next, a bone marrow biopsy was obtained with the 11 gauge outer bone marrow device. Samples were prepared with the cytotechnologist and deemed adequate. The needle was removed and superficial hemostasis was obtained with manual compression. A dressing was applied. The patient tolerated the procedure well without immediate post procedural complication. IMPRESSION: Successful CT guided right iliac bone marrow aspiration and core biopsy. Ruthann Cancer, MD [facility] Rad[facility] Electronically Signed   By: Ruthann Cancer M.D.   On: 02/01/2022 10:51     ASSESSMENT:  IgG kappa smoldering multiple myeloma: - Skeletal survey in  February 2022 was negative. - Bone marrow biopsy on 06/06/2020 with 20% plasma cells.  Myeloma FISH panel with monosomy 13/deletion 13.  Chromosome analysis 46, XY (20). - No "crab" features. - PET scan from 06/29/2021: No evidence of myeloma or plasmacytoma. - Skeletal survey on 06/13/2021: No lytic lesions. - MRI of the cervical/thoracic/lumbar spine negative for bone lesions with some degenerative changes. - BMBX (02/01/2022): Hypercellular marrow  with plasma cells representing 35% of all cells.  They display kappa light chain restriction.  Cytogenetics-46, XY (20). - Myeloma FISH panel: Monosomy 13.   2.  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 - No major chemical exposures.  He had personal history of melanoma of the upper lip. - Father had acute myeloid leukemia.  Mother had multiple myeloma.  Son died of acute lymphocytic leukemia   PLAN:  1.  IgG kappa smoldering multiple myeloma: - Due to his worsening myeloma labs, we have recommended bone marrow biopsy. - I discussed bone marrow biopsy results which showed myeloma cells accounting for 35% of the total cell population. - Light chains on 02/13/2022 shows kappa light chains 505, lambda light chains 9.1 and ratio 55.  CBC shows hemoglobin 12.3. - His myeloma is gradually getting worse with increasing plasma cells in the bone marrow. - We discussed the pros and cons of proceeding with treatment versus waiting until "crab" symptoms develop.  He does not want to do anything until after the holidays. - I have recommended follow-up in 6 weeks.  Will repeat myeloma labs, LDH beta-2 microglobulin. - We will also repeat PET CT scan of the whole body. - Because of his comorbidities, he will not be a good candidate for bone marrow transplant.  He has a bad peripheral neuropathy in the legs. - When we start treatment, we will consider DRD regimen as he is standard risk. - RTC 6 weeks after labs.   2.  Peripheral neuropathy: - He was on gabapentin 300 mg 4 times daily.  This was switched to Lyrica. - He started taking Lyrica 100 mg 3 times daily 1 week ago. - 300 mg is the maximum dose based on his GFR of 48 mL/min.  He is very concerned that Lyrica will affect his kidney function.  He may decrease Lyrica to 100 mg twice daily if his symptoms allow.  Orders placed this encounter:  Orders Placed This Encounter  Procedures   NM  PET Image Initial (PI) Whole Body   Protein electrophoresis, serum   Kappa/lambda light chains   CBC with Differential/Platelet   Comprehensive metabolic panel   Lactate dehydrogenase   Beta 2 microglobulin, serum      Derek Jack, MD [place] [phone]

## 2022-02-26 ENCOUNTER — Telehealth: Payer: Self-pay | Admitting: *Deleted

## 2022-02-26 NOTE — Telephone Encounter (Signed)
Patient called to advise that Lyrica has not been effective and has gone back to taking gabapentin.

## 2022-03-02 DIAGNOSIS — E1140 Type 2 diabetes mellitus with diabetic neuropathy, unspecified: Secondary | ICD-10-CM | POA: Diagnosis not present

## 2022-03-02 DIAGNOSIS — D649 Anemia, unspecified: Secondary | ICD-10-CM | POA: Diagnosis not present

## 2022-03-02 DIAGNOSIS — E1122 Type 2 diabetes mellitus with diabetic chronic kidney disease: Secondary | ICD-10-CM | POA: Diagnosis not present

## 2022-03-02 DIAGNOSIS — G3184 Mild cognitive impairment, so stated: Secondary | ICD-10-CM | POA: Diagnosis not present

## 2022-03-02 DIAGNOSIS — I10 Essential (primary) hypertension: Secondary | ICD-10-CM | POA: Diagnosis not present

## 2022-03-02 DIAGNOSIS — L97512 Non-pressure chronic ulcer of other part of right foot with fat layer exposed: Secondary | ICD-10-CM | POA: Diagnosis not present

## 2022-03-02 DIAGNOSIS — C9000 Multiple myeloma not having achieved remission: Secondary | ICD-10-CM | POA: Diagnosis not present

## 2022-03-02 DIAGNOSIS — Z23 Encounter for immunization: Secondary | ICD-10-CM | POA: Diagnosis not present

## 2022-03-02 DIAGNOSIS — N1831 Chronic kidney disease, stage 3a: Secondary | ICD-10-CM | POA: Diagnosis not present

## 2022-03-13 DIAGNOSIS — L97511 Non-pressure chronic ulcer of other part of right foot limited to breakdown of skin: Secondary | ICD-10-CM | POA: Diagnosis not present

## 2022-04-04 ENCOUNTER — Other Ambulatory Visit: Payer: Self-pay

## 2022-04-04 DIAGNOSIS — D472 Monoclonal gammopathy: Secondary | ICD-10-CM

## 2022-04-05 ENCOUNTER — Inpatient Hospital Stay: Payer: Medicare Other | Attending: Hematology

## 2022-04-05 ENCOUNTER — Ambulatory Visit (HOSPITAL_COMMUNITY)
Admission: RE | Admit: 2022-04-05 | Discharge: 2022-04-05 | Disposition: A | Discharge status: Home / Self Care | Payer: Medicare Other | Source: Ambulatory Visit | Attending: Hematology | Admitting: Hematology

## 2022-04-05 DIAGNOSIS — G629 Polyneuropathy, unspecified: Secondary | ICD-10-CM | POA: Insufficient documentation

## 2022-04-05 DIAGNOSIS — C9000 Multiple myeloma not having achieved remission: Secondary | ICD-10-CM | POA: Diagnosis not present

## 2022-04-05 DIAGNOSIS — Z5112 Encounter for antineoplastic immunotherapy: Secondary | ICD-10-CM | POA: Diagnosis not present

## 2022-04-05 DIAGNOSIS — Z79899 Other long term (current) drug therapy: Secondary | ICD-10-CM | POA: Diagnosis not present

## 2022-04-05 DIAGNOSIS — D472 Monoclonal gammopathy: Secondary | ICD-10-CM | POA: Insufficient documentation

## 2022-04-05 LAB — CBC WITH DIFFERENTIAL/PLATELET
Abs Immature Granulocytes: 0.02 10*3/uL (ref 0.00–0.07)
Basophils Absolute: 0.1 10*3/uL (ref 0.0–0.1)
Basophils Relative: 1 %
Eosinophils Absolute: 0.6 10*3/uL — ABNORMAL HIGH (ref 0.0–0.5)
Eosinophils Relative: 9 %
HCT: 34.4 % — ABNORMAL LOW (ref 39.0–52.0)
Hemoglobin: 11.8 g/dL — ABNORMAL LOW (ref 13.0–17.0)
Immature Granulocytes: 0 %
Lymphocytes Relative: 24 %
Lymphs Abs: 1.5 10*3/uL (ref 0.7–4.0)
MCH: 35.5 pg — ABNORMAL HIGH (ref 26.0–34.0)
MCHC: 34.3 g/dL (ref 30.0–36.0)
MCV: 103.6 fL — ABNORMAL HIGH (ref 80.0–100.0)
Monocytes Absolute: 0.6 10*3/uL (ref 0.1–1.0)
Monocytes Relative: 9 %
Neutro Abs: 3.6 10*3/uL (ref 1.7–7.7)
Neutrophils Relative %: 57 %
Platelets: 231 10*3/uL (ref 150–400)
RBC: 3.32 MIL/uL — ABNORMAL LOW (ref 4.22–5.81)
RDW: 16.1 % — ABNORMAL HIGH (ref 11.5–15.5)
WBC: 6.4 10*3/uL (ref 4.0–10.5)
nRBC: 0 % (ref 0.0–0.2)

## 2022-04-05 LAB — COMPREHENSIVE METABOLIC PANEL
ALT: 8 U/L (ref 0–44)
AST: 15 U/L (ref 15–41)
Albumin: 2.7 g/dL — ABNORMAL LOW (ref 3.5–5.0)
Alkaline Phosphatase: 68 U/L (ref 38–126)
Anion gap: 7 (ref 5–15)
BUN: 18 mg/dL (ref 8–23)
CO2: 25 mmol/L (ref 22–32)
Calcium: 8.4 mg/dL — ABNORMAL LOW (ref 8.9–10.3)
Chloride: 100 mmol/L (ref 98–111)
Creatinine, Ser: 1.29 mg/dL — ABNORMAL HIGH (ref 0.61–1.24)
GFR, Estimated: 57 mL/min — ABNORMAL LOW (ref >60)
Glucose, Bld: 133 mg/dL — ABNORMAL HIGH (ref 70–99)
Potassium: 3.2 mmol/L — ABNORMAL LOW (ref 3.5–5.1)
Sodium: 132 mmol/L — ABNORMAL LOW (ref 135–145)
Total Bilirubin: 1.1 mg/dL (ref 0.3–1.2)
Total Protein: 11.1 g/dL — ABNORMAL HIGH (ref 6.5–8.1)

## 2022-04-05 LAB — VITAMIN B12: Vitamin B-12: 122 pg/mL — ABNORMAL LOW (ref 180–914)

## 2022-04-05 LAB — LACTATE DEHYDROGENASE: LDH: 89 U/L — ABNORMAL LOW (ref 98–192)

## 2022-04-05 MED ORDER — FLUDEOXYGLUCOSE F - 18 (FDG) INJECTION
9.8700 | Freq: Once | INTRAVENOUS | Status: AC | PRN
  Administered 2022-04-05: 9.87 via INTRAVENOUS

## 2022-04-06 LAB — KAPPA/LAMBDA LIGHT CHAINS
Kappa free light chain: 656.3 mg/L — ABNORMAL HIGH (ref 3.3–19.4)
Kappa, lambda light chain ratio: 71.34 — ABNORMAL HIGH (ref 0.26–1.65)
Lambda free light chains: 9.2 mg/L (ref 5.7–26.3)

## 2022-04-06 LAB — BETA 2 MICROGLOBULIN, SERUM: Beta-2 Microglobulin: 4.1 mg/L — ABNORMAL HIGH (ref 0.6–2.4)

## 2022-04-09 LAB — PROTEIN ELECTROPHORESIS, SERUM
A/G Ratio: 0.6 — ABNORMAL LOW (ref 0.7–1.7)
Albumin ELP: 4 g/dL (ref 2.9–4.4)
Alpha-1-Globulin: 0.2 g/dL (ref 0.0–0.4)
Alpha-2-Globulin: 0.5 g/dL (ref 0.4–1.0)
Beta Globulin: 0.8 g/dL (ref 0.7–1.3)
Gamma Globulin: 5.6 g/dL — ABNORMAL HIGH (ref 0.4–1.8)
Globulin, Total: 7.2 g/dL — ABNORMAL HIGH (ref 2.2–3.9)
M-Spike, %: 5.3 g/dL — ABNORMAL HIGH
Total Protein ELP: 11.2 g/dL — ABNORMAL HIGH (ref 6.0–8.5)

## 2022-04-11 ENCOUNTER — Inpatient Hospital Stay: Payer: Medicare Other | Admitting: Hematology

## 2022-04-11 VITALS — BP 120/80 | HR 80 | Temp 97.9°F | Resp 18 | Ht 67.5 in | Wt 183.0 lb

## 2022-04-11 DIAGNOSIS — Z79899 Other long term (current) drug therapy: Secondary | ICD-10-CM | POA: Diagnosis not present

## 2022-04-11 DIAGNOSIS — G629 Polyneuropathy, unspecified: Secondary | ICD-10-CM | POA: Diagnosis not present

## 2022-04-11 DIAGNOSIS — D472 Monoclonal gammopathy: Secondary | ICD-10-CM | POA: Diagnosis not present

## 2022-04-11 DIAGNOSIS — C9000 Multiple myeloma not having achieved remission: Secondary | ICD-10-CM | POA: Diagnosis not present

## 2022-04-11 DIAGNOSIS — Z5112 Encounter for antineoplastic immunotherapy: Secondary | ICD-10-CM | POA: Diagnosis not present

## 2022-04-11 MED ORDER — ACYCLOVIR 400 MG PO TABS: 400.0000 mg | ORAL_TABLET | Freq: Two times a day (BID) | ORAL | 6 refills | Status: DC

## 2022-04-11 MED ORDER — DEXAMETHASONE 4 MG PO TABS: 20.0000 mg | ORAL_TABLET | ORAL | 6 refills | Status: DC

## 2022-04-11 NOTE — Progress Notes (Signed)
[place], [place] [address]   CLINIC:  Medical Oncology/Hematology  PCP:  Celene Squibb, MD [address] Liana Crocker [place] [address]  [phone]  REASON FOR VISIT:  Follow-up for IgG kappa multiple myeloma  PRIOR THERAPY: none  CURRENT THERAPY: Daratumumab, Revlimid and dexamethasone  INTERVAL HISTORY:  [name], a 77 y.o. male, seen for follow-up of multiple myeloma.  Denies any new onset pains.  Has neuropathy in the hands and feet which is controlled with gabapentin 4 times daily.  REVIEW OF SYSTEMS:  Review of Systems  Respiratory:  Positive for cough.   Genitourinary:  Positive for frequency.   Neurological:  Positive for numbness (Tingling in the feet and legs).  Psychiatric/Behavioral:  Positive for sleep disturbance.   All other systems reviewed and are negative.   PAST MEDICAL/SURGICAL HISTORY:  Past Medical History:  Diagnosis Date   Adverse effect of unspecified drugs, medicaments and biological substances, initial encounter    Essential (primary) hypertension    Hyperlipidemia, unspecified    Impotence of organic origin    Malignant melanoma of skin, unspecified ([facility])    Malignant neoplasm of prostate ([facility])    Multiple myeloma (HCC)    Neuropathy    Non-pressure chronic ulcer of other part of unspecified foot with unspecified severity (HCC)    Reflux esophagitis    Restless legs syndrome    Tinea unguium    Type 2 diabetes mellitus with diabetic neuropathy, unspecified ([facility])    Type 2 diabetes mellitus with other specified complication (HCC)    Unspecified atrial fibrillation (HCC)    Past Surgical History:  Procedure Laterality Date   CATARACT EXTRACTION     HERNIA REPAIR     melanoma removal     PROSTATE SURGERY      SOCIAL HISTORY:  Social History   Socioeconomic History   Marital status: Married    Spouse name: Not on file   Number of children: Not on file   Years of education: Not  on file   Highest education level: Not on file  Occupational History   Not on file  Tobacco Use   Smoking status: Never   Smokeless tobacco: Never  Vaping Use   Vaping Use: Never used  Substance and Sexual Activity   Alcohol use: No    Alcohol/week: 0.0 standard drinks of alcohol   Drug use: No   Sexual activity: Not Currently  Other Topics Concern   Not on file  Social History Narrative   Not on file   Social Determinants of Health   Financial Resource Strain: Low Risk  (05/13/2020)   Overall Financial Resource Strain (CARDIA)    Difficulty of Paying Living Expenses: Not hard at all  Food Insecurity: No Food Insecurity (05/13/2020)   Hunger Vital Sign    Worried About Running Out of Food in the Last Year: Never true    Ran Out of Food in the Last Year: Never true  Transportation Needs: No Transportation Needs (05/13/2020)   PRAPARE - Hydrologist (Medical): No    Lack of Transportation (Non-Medical): No  Physical Activity: Insufficiently Active (05/13/2020)   Exercise Vital Sign    Days of Exercise per Week: 3 days    Minutes of Exercise per Session: 20 min  Stress: No Stress Concern Present (05/13/2020)   [place]    Feeling of Stress :  Not at all  Social Connections: Moderately Integrated (05/13/2020)   Social Connection and Isolation Panel [NHANES]    Frequency of Communication with Friends and Family: Twice a week    Frequency of Social Gatherings with Friends and Family: Twice a week    Attends Religious Services: 1 to 4 times per year    Active Member of [employer] or Organizations: No    Attends Archivist Meetings: Never    Marital Status: Married  Human resources officer Violence: Not At Risk (05/13/2020)   Humiliation, Afraid, Rape, and Kick questionnaire    Fear of Current or Ex-Partner: No    Emotionally Abused: No    Physically Abused: No    Sexually Abused: No     FAMILY HISTORY:  No family history on file.  CURRENT MEDICATIONS:  Current Outpatient Medications  Medication Sig Dispense Refill   Accu-Chek FastClix Lancets MISC Apply topically 2 (two) times daily.     acyclovir (ZOVIRAX) 400 MG tablet Take 1 tablet (400 mg total) by mouth 2 (two) times daily. 60 tablet 6   atenolol-chlorthalidone (TENORETIC) 50-25 MG per tablet Take 1 tablet by mouth daily. Takes 1/2 daily per Dr Luan Pulling     cyanocobalamin (VITAMIN B12) 1000 MCG/ML injection Inject 1,000 mcg into the muscle every 30 (thirty) days.     dexamethasone (DECADRON) 4 MG tablet Take 5 tablets (20 mg total) by mouth once a week. 30 tablet 6   diclofenac sodium (VOLTAREN) 1 % GEL Apply 2 g topically daily as needed.     gabapentin (NEURONTIN) 300 MG capsule Take 300 mg by mouth 3 (three) times daily.     loperamide (IMODIUM A-D) 2 MG tablet Take 2 mg by mouth 4 (four) times daily as needed for diarrhea or loose stools.     losartan (COZAAR) 50 MG tablet Take 50 mg by mouth daily.     metFORMIN (GLUCOPHAGE) 500 MG tablet Take 500 mg by mouth 2 (two) times daily with a meal.     pantoprazole (PROTONIX) 40 MG tablet Take 40 mg by mouth daily.     silver sulfADIAZINE (SILVADENE) 1 % cream Apply topically.     No current facility-administered medications for this visit.    ALLERGIES:  Allergies  Allergen Reactions   Penicillins Rash    Also swelling reaction    Gadavist [Gadobutrol] Nausea Only    Patient became nauseated immediately after contrast injection.  Was better after a few minutes.     PHYSICAL EXAM:  Performance status (ECOG): 1 - Symptomatic but completely ambulatory  Vitals:   04/11/22 1444  BP: 120/80  Pulse: 80  Resp: 18  Temp: 97.9 F (36.6 C)  SpO2: 99%   Wt Readings from Last 3 Encounters:  04/11/22 183 lb (83 kg)  02/20/22 185 lb 6.4 oz (84.1 kg)  02/01/22 183 lb (83 kg)   Physical Exam Vitals reviewed.  Constitutional:      Appearance: Normal  appearance.  Cardiovascular:     Rate and Rhythm: Normal rate and regular rhythm.     Pulses: Normal pulses.     Heart sounds: Normal heart sounds.  Pulmonary:     Effort: Pulmonary effort is normal.     Breath sounds: Normal breath sounds.  Neurological:     General: No focal deficit present.     Mental Status: He is alert and oriented to person, place, and time.  Psychiatric:        Mood and Affect: Mood normal.  Behavior: Behavior normal.     LABORATORY DATA:  I have reviewed the labs as listed.     Latest Ref Rng & Units 04/05/2022   10:23 AM 02/01/2022    8:20 AM 01/01/2022    1:20 PM  CBC  WBC 4.0 - 10.5 K/uL 6.4  7.9  7.7   Hemoglobin 13.0 - 17.0 g/dL 11.8  12.3  11.7   Hematocrit 39.0 - 52.0 % 34.4  35.4  33.2   Platelets 150 - 400 K/uL 231  233  299       Latest Ref Rng & Units 04/05/2022   10:23 AM 01/01/2022    1:20 PM 10/04/2021    8:24 AM  CMP  Glucose 70 - 99 mg/dL 133  133  150   BUN 8 - 23 mg/dL '18  26  21   '$ Creatinine 0.61 - 1.24 mg/dL 1.29  1.49  1.25   Sodium 135 - 145 mmol/L 132  134  132   Potassium 3.5 - 5.1 mmol/L 3.2  3.2  3.2   Chloride 98 - 111 mmol/L 100  102  99   CO2 22 - 32 mmol/L '25  24  24   '$ Calcium 8.9 - 10.3 mg/dL 8.4  8.6  8.5   Total Protein 6.5 - 8.1 g/dL 11.1  10.7  10.5   Total Bilirubin 0.3 - 1.2 mg/dL 1.1  1.1  1.0   Alkaline Phos 38 - 126 U/L 68  74  59   AST 15 - 41 U/L '15  15  13   '$ ALT 0 - 44 U/L '8  11  10       '$ Component Value Date/Time   RBC 3.32 (L) 04/05/2022 1023   MCV 103.6 (H) 04/05/2022 1023   MCH 35.5 (H) 04/05/2022 1023   MCHC 34.3 04/05/2022 1023   RDW 16.1 (H) 04/05/2022 1023   LYMPHSABS 1.5 04/05/2022 1023   MONOABS 0.6 04/05/2022 1023   EOSABS 0.6 (H) 04/05/2022 1023   BASOSABS 0.1 04/05/2022 1023    DIAGNOSTIC IMAGING:  I have independently reviewed the scans and discussed with the patient. NM PET Image Restage (PS) Whole Body (F-18 FDG)  Result Date: 04/09/2022 CLINICAL DATA:  Subsequent  treatment strategy for multiple myeloma. EXAM: NUCLEAR MEDICINE PET WHOLE BODY TECHNIQUE: 9.87 mCi F-18 FDG was injected intravenously. Full-ring PET imaging was performed from the head to foot after the radiotracer. CT data was obtained and used for attenuation correction and anatomic localization. Fasting blood glucose: 151 mg/dl COMPARISON:  MRI cervical/thoracic/lumbar spine and sacrum dated October 20, 2021 and PET-CT June 29, 2021 FINDINGS: Mediastinal blood pool activity: SUV max 2.2 HEAD/NECK: No hypermetabolic activity in the scalp. No hypermetabolic cervical lymph nodes. Incidental CT findings: Carotid artery calcifications. Dental hardware. Hypodense bilateral thyroid nodules measure up to 10 mm and are not hypermetabolic. Not clinically significant; no follow-up imaging recommended (Ref: J Am Coll Radiol. 2015 Feb;12(2): 143-50). CHEST: No hypermetabolic pulmonary nodules or masses. No hypermetabolic thoracic adenopathy Incidental CT findings: Aortic atherosclerosis. Coronary artery calcifications/stents. Mild four-chamber cardiac enlargement. Motion degraded examination of the lungs reveals no suspicious pulmonary nodules or masses. ABDOMEN/PELVIS: No abnormal hypermetabolic activity within the liver, pancreas, adrenal glands, or spleen. No hypermetabolic lymph nodes in the abdomen or pelvis. Diffuse hypermetabolic activity in the transverse colon extending through the rectum, commonly physiologic/medication-related (metformin). Incidental CT findings: Small hiatal hernia. Splenic granulomata. Prior prostatectomy with pelvic lymph node dissection. Vasectomy clips. Aortic atherosclerosis. SKELETON: No focal hypermetabolic activity  to suggest skeletal metastasis. Incidental CT findings: Multilevel degenerative changes spine with multifocal degenerative joint disease. Biopsy tract in the right iliac bone. EXTREMITIES: No suspicious abnormal hypermetabolic activity in the lower extremities. Small left knee  joint effusion with mild hypermetabolic activity along the lateral collateral ligament with a max SUV of 2.7 is favored inflammatory. Hypermetabolic activity about the right first tarsometatarsal joint with a max SUV of 4.1 is favored inflammatory. Incidental CT findings: Multifocal degenerative joint disease. IMPRESSION: 1. No abnormal foci of increased uptake within the skeleton to suggest metabolically active multiple myeloma. 2. No suspicious bone lesions identified on the CT portion of the exam. 3. No tracer avid lymph node or mass identified to suggest soft tissue plasmacytoma 4. Small left knee joint effusion with mild hypermetabolic activity along the lateral collateral ligament with is favored inflammatory. 5. Hypermetabolic activity about the right first tarsometatarsal joint is favored inflammatory. 6. Diffuse hypermetabolic activity in the transverse colon extending through the rectum, commonly physiologic/medication-related (metformin). 7. Aortic Atherosclerosis (ICD10-I70.0). Electronically Signed   By: Dahlia Bailiff M.D.   On: 04/09/2022 13:09     ASSESSMENT:  IgG kappa smoldering multiple myeloma: - Skeletal survey in February 2022 was negative. - Bone marrow biopsy on 06/06/2020 with 20% plasma cells.  Myeloma FISH panel with monosomy 13/deletion 13.  Chromosome analysis 46, XY (20). - No "crab" features. - PET scan from 06/29/2021: No evidence of myeloma or plasmacytoma. - Skeletal survey on 06/13/2021: No lytic lesions. - MRI of the cervical/thoracic/lumbar spine negative for bone lesions with some degenerative changes. - BMBX (02/01/2022): Hypercellular marrow with plasma cells representing 35% of all cells.  They display kappa light chain restriction.  Cytogenetics-46, XY (20). - Myeloma FISH panel: Monosomy 13. - PET scan (04/05/2022): No suspicious bony lesions or soft tissue masses.   2.  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 - No major chemical exposures.  He had personal history of melanoma of the upper lip. - Father had acute myeloid leukemia.  Mother had multiple myeloma.  Son died of acute lymphocytic leukemia   PLAN:  1.  Stage II IgG kappa multiple myeloma, standard risk: - We reviewed labs from 04/05/2021.  M spike further worsened to 5.3 g, up from 4.3 g on 01/01/2022.  Creatinine is 1.29 and hemoglobin is 11.8.  Kappa light chain is further increased to 656 and ratio increased to 71. - PET scan on 04/05/2022: No suspicious bony lesions or soft tissue masses.  Hypermetabolic activity in the right first tarsometatarsal joint.  Diffuse hypermetabolic activity in the transverse colon extending through the rectum commonly physiologic/metformin induced. - As his myeloma numbers are rapidly increasing, although he does not yet have textbook criteria of "crab" I have recommended starting treatment.  We also discussed the pros and cons of waiting until some complication like renal failure or fracture develops and then initiate treatment.  We discussed treatment with daratumumab, Revlimid and dexamethasone regimen as he is not a transplant candidate. - We discussed side effects of individual drugs and the regimen in detail. - He is agreeable and gives [place] permission to proceed with the treatment. - Tentatively we will start treatment on 04/24/2022.  I will see him on day 8 of treatment. - Because of his diabetes, we will started dexamethasone 20 mg weekly.  We will also dose reduce Revlimid to 10 mg 3 weeks on/1 week off based on his renal function.  After  2 cycles, if he tolerates well, will increase to 15 mg. - All questions were answered to his satisfaction.   2.  Peripheral neuropathy: - Continue gabapentin 300 mg 4 times daily.  This is fairly well-controlled.  3.  Prophylaxis: - We will start him on acyclovir 400 mg twice daily for shingles prophylaxis.  Will also start him on aspirin 81 mg for  thromboprophylaxis.  Orders placed this encounter:  Orders Placed This Encounter  Procedures   CBC with Differential   CBC with Differential   CBC with Differential   CBC with Differential   CBC with Differential   CBC with Differential   CBC with Differential   CBC with Differential   CBC with Differential   CBC with Differential   CBC with Differential   CBC with Differential   CMP ([facility] only)   Protein electrophoresis, serum   Kappa/lambda light chains   Magnesium      Derek Jack, MD [place] [phone]

## 2022-04-11 NOTE — Patient Instructions (Addendum)
[name] [name]  Discharge Instructions  You were seen and examined today by Dr. Delton Coombes.  Your Myeloma labs are worsening. Your PET scan is stable with no signs of myeloma attacking your bones.  The typical indications for treatment are anemia, increased calcium, kidney dysfunction or bone lesions. You have none of these but your myeloma numbers are steadily increasing.  You have been given the option to start treatment now or wait until one, or more, of the typical treatment indications happen. At this point, Dr. Delton Coombes has recommended starting treatment. If you decide to wait, treatment is inevitable.   Your treatment will include three drugs. Two of which are pills to be taken at home and one is an injection that is given once a week here in the cancer center.   You will be treated with three drugs: Daratumumab (injection). This will decrease your immune system and make you more susceptible to infections. During treatments (especially the first two), there is a risk of reaction. Reactions would only occur here in the [facility] (taken for 21 days on, 7 days off). This can cause fatigue, constipation, diarrhea or rarely nausea. Dexamethasone (5 pills taken weekly on the day of your injection - take before you come to the [facility]). This will increase your sugar.  Follow-up as scheduled.  Thank you for choosing [facility] to provide your oncology and hematology care.   To afford each patient quality time with our provider, please arrive at least 15 minutes before your scheduled appointment time. You may need to reschedule your appointment if you arrive late (10 or more minutes). Arriving late affects you and other patients whose appointments are after yours.  Also, if you miss three or more appointments without notifying the office, you may be dismissed from the clinic at the provider's discretion.    Again, thank  you for choosing [facility].  Our hope is that these requests will decrease the amount of time that you wait before being seen by our physicians.   If you have a lab appointment with the [facility] please come in thru the Main Entrance and check in at the main information desk.           _____________________________________________________________  Should you have questions after your visit to [facility], please contact our office at [phone] and follow the prompts.  Our office hours are 8:00 a.m. to 4:30 p.m. Monday - Thursday and 8:00 a.m. to 2:30 p.m. Friday.  Please note that voicemails left after 4:00 p.m. may not be returned until the following business day.  We are closed weekends and all major holidays.  You do have access to a nurse 24-7, just call the main number to the clinic [phone] and do not press any options, hold on the line and a nurse will answer the phone.    For prescription refill requests, have your pharmacy contact our office and allow 72 hours.    Masks are optional in the cancer centers. If you would like for your care team to wear a mask while they are taking care of you, please let them know. You may have one support person who is at least 78 years old accompany you for your appointments.

## 2022-04-11 NOTE — Progress Notes (Signed)
START ON PATHWAY REGIMEN - Multiple Myeloma and Other Plasma Cell Dyscrasias ? ? ?  Cycles 1 and 2: A cycle is every 28 days: ?    Lenalidomide  ?    Dexamethasone  ?    Daratumumab and hyaluronidase-fihj  ?  Cycles 3 through 6: A cycle is every 28 days: ?    Lenalidomide  ?    Dexamethasone  ?    Daratumumab and hyaluronidase-fihj  ?  Cycles 7 and beyond: A cycle is every 28 days: ?    Lenalidomide  ?    Dexamethasone  ?    Daratumumab and hyaluronidase-fihj  ? ?**Always confirm dose/schedule in your pharmacy ordering system** ? ?Patient Characteristics: ?Multiple Myeloma, Newly Diagnosed, Transplant Ineligible or Refused, Standard Risk ?Disease Classification: Multiple Myeloma ?R-ISS Staging: II ?Therapeutic Status: Newly Diagnosed ?Is Patient Eligible for Transplant<= Transplant Ineligible or Refused ?Risk Status: Standard Risk ?Intent of Therapy: ?Non-Curative / Palliative Intent, Discussed with Patient ?

## 2022-04-12 ENCOUNTER — Telehealth: Payer: Self-pay

## 2022-04-12 ENCOUNTER — Encounter: Payer: Self-pay | Admitting: Hematology

## 2022-04-12 ENCOUNTER — Other Ambulatory Visit: Payer: Self-pay

## 2022-04-12 ENCOUNTER — Other Ambulatory Visit (HOSPITAL_COMMUNITY): Payer: Self-pay

## 2022-04-12 ENCOUNTER — Telehealth: Payer: Self-pay | Admitting: Pharmacist

## 2022-04-12 DIAGNOSIS — L97511 Non-pressure chronic ulcer of other part of right foot limited to breakdown of skin: Secondary | ICD-10-CM | POA: Diagnosis not present

## 2022-04-12 MED ORDER — LENALIDOMIDE 10 MG PO CAPS: 10.0000 mg | ORAL_CAPSULE | Freq: Every day | ORAL | 0 refills | Status: DC

## 2022-04-12 NOTE — Telephone Encounter (Signed)
Oral Oncology Pharmacist Encounter  Received new prescription for Revlimid (lenalidomide) for the treatment of IgG kappa multiple myeloma in conjunction with daratumumab and dexamethasone, planned duration until disease control or unacceptable drug toxicity. Planned start 04/24/22 along with daratumumab injection appt.   CMP from 04/05/22 assessed, SCr slightly elevated at 1.29, CrCl ~50. Patient is bing started on a reduced dose. Prescription dose and frequency assessed.   Current medication list in Epic reviewed, no DDIs with lenalidomide identified.  Evaluated chart and no patient barriers to medication adherence identified.   Prescription has been e-scribed to [facility] [place] for benefits analysis and approval.  Oral Oncology Clinic will continue to follow for insurance authorization, copayment issues, initial counseling and start date.  Patient agreed to treatment on 04/12/22 per MD documentation.  Darl Pikes, PharmD, BCPS, BCOP, CPP Hematology/Oncology Clinical Pharmacist Practitioner [place]/DB/AP Oral [facility] [phone]  04/12/2022 11:21 AM

## 2022-04-12 NOTE — Telephone Encounter (Signed)
Revlimid order placed and sent to [facility] per Dr. Delton Coombes verbal order.

## 2022-04-12 NOTE — Telephone Encounter (Signed)
Oral Oncology Patient Advocate Encounter  After completing a benefits investigation, prior authorization for Lenalidomide is not required at this time through [facility].  Patient's copay is $3,890.13.     Berdine Addison, [facility] Patient [place]  [phone] (phone) [phone] (fax) 04/12/2022 8:43 AM

## 2022-04-12 NOTE — Telephone Encounter (Signed)
Oral Oncology Patient Advocate Encounter  Was successful in securing patient a $12,000 grant from [facility] to provide copayment coverage for Lenalidomide.  This will keep the out of pocket expense at $0.     [employer] ID: [id number]   The billing information is as follows and has been shared with [facility].    RxBin: [id number] PCN: [id number] Member ID: [id number] Group ID: [id number] Dates of Eligibility: 03/13/22 through 03/13/23  Fund:  Multiple Myeloma - Medicare [place], [facility] Patient [place]  [phone] (phone) [phone] (fax) 04/12/2022 8:53 AM

## 2022-04-13 ENCOUNTER — Other Ambulatory Visit: Payer: Self-pay

## 2022-04-13 NOTE — Telephone Encounter (Signed)
Oral Chemotherapy Pharmacist Encounter  Patient knows his prescription has been sent to [facility]. Provided him with their phone number.   Patient Education I spoke with patient for overview of new oral chemotherapy medication: Revlimid (lenalidomide) for the treatment of IgG kappa multiple myeloma in conjunction with daratumumab and dexamethasone, planned duration until disease control or unacceptable drug toxicity. Planned start 04/24/22 along with daratumumab injection appt.  Pt is doing well. Counseled patient on administration, dosing, side effects, monitoring, drug-food interactions, safe handling, storage, and disposal. Patient will take 1 capsule (10 mg total) by mouth daily. 21 days on, 7 days off.  Patient knows to start taking aspirin '81mg'$  daily once he starts his lenalidomide.  Side effects include but not limited to: rash/itchy skin, N/V, fatigue, decreased wbc/hgb/plt, constipation or diarrhea.    Reviewed with patient importance of keeping a medication schedule and plan for any missed doses.  After discussion with patient no patient barriers to medication adherence identified.   Mr. Hartshorne voiced understanding and appreciation. All questions answered. Medication handout and calendar provided.  Provided patient with Oral [facility] phone number. Patient knows to call the office with questions or concerns. Oral Chemotherapy Navigation Clinic will continue to follow.  Darl Pikes, PharmD, BCPS, BCOP, CPP Hematology/Oncology Clinical Pharmacist Practitioner [place]/DB/AP Oral [facility] [phone]  04/13/2022 11:50 AM

## 2022-04-14 ENCOUNTER — Other Ambulatory Visit: Payer: Self-pay

## 2022-04-17 DIAGNOSIS — R059 Cough, unspecified: Secondary | ICD-10-CM | POA: Diagnosis not present

## 2022-04-18 ENCOUNTER — Ambulatory Visit (HOSPITAL_COMMUNITY)
Admission: RE | Admit: 2022-04-18 | Discharge: 2022-04-18 | Disposition: A | Discharge status: Home / Self Care | Payer: Medicare Other | Source: Ambulatory Visit | Attending: Family Medicine | Admitting: Family Medicine

## 2022-04-18 ENCOUNTER — Other Ambulatory Visit (HOSPITAL_COMMUNITY): Payer: Self-pay | Admitting: Family Medicine

## 2022-04-18 DIAGNOSIS — J069 Acute upper respiratory infection, unspecified: Secondary | ICD-10-CM | POA: Diagnosis not present

## 2022-04-18 DIAGNOSIS — R059 Cough, unspecified: Secondary | ICD-10-CM

## 2022-04-22 ENCOUNTER — Other Ambulatory Visit: Payer: Self-pay

## 2022-04-22 NOTE — Patient Instructions (Addendum)
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 You will be treated with a combination of medications to treat this disease. Those are daratumumab (Darzalex), lenalidomide (Revlimid), and dexamethasone. You will come to the clinic weekly to receive the Darzalex injection. You will do this for a total of 9 weeks. Then the injection is switched to every other week for 6 times (12 weeks total). Then the injection will be switched to every 4 weeks. You will take the dexamethasone weekly, even on days when you are not receiving the Darzalex injection. The Revlimid is a chemo pill that is delivered to you which you will take at home. You take this pill for 3 weeks in a row with 1 week off. This will be a recurring cycle throughout your treatment. The intent of treatment is to control this disease, prevent it from worsening, and to alleviate any symptoms you may be having related to the myeloma. You will see the doctor regularly throughout treatment. We will obtain blood work from you prior to every treatment and monitor your results to make sure it is safe to give your treatment. The doctor monitors your response to treatment by the way you are feeling, your blood work, and by obtaining scans periodically. There will be wait times while you are here for treatment.  It will take about 30 minutes to 1 hour for your lab work to result. Then there will be wait times while pharmacy mixes your medications.    Daratumumab (Darzalex Faspro)  About This Drug Daratumumab is used to treat multiple myeloma. It is given under the skin in your abdomen (subcutaneously). Daratumumab is an antibody directed against a protein called CD38, which is found on the surface of multiple myeloma cells. Once the daratumumab attaches itself to the cells expressing CD38, it summons the body's immune system to attack and destroy those cells.  Possible Side Effects   You may  have a reaction to the drug. Sometimes you may be given medication to stop or lessen these side effects. Your nurse will check you closely for these signs: fever or shaking chills, flushing, facial swelling, feeling dizzy, headache, trouble breathing, rash, itching, chest tightness, or chest pain. These reactions may happen after your infusion. If this happens, call 911 for emergency care.   Decrease in the number of white blood cells and platelets. This may raise your risk of infection, and raise your risk of bleeding.   Fever and chills   Tiredness   Feeling dizzy   Trouble sleeping   Cough and trouble breathing   Upper respiratory infection   Nausea and throwing up (vomiting)   Loose bowel movements (diarrhea)   Constipation (not able to move bowels)   Muscle spasms   Pain in the joints   Back pain   Swelling of your legs, ankles and/or feet   Effects on the nerves are called peripheral neuropathy. You may feel numbness, tingling, or pain in your hands and feet. It may be hard for you to button your clothes, open jars, or walk as usual. The effect on the nerves may get worse with more doses of the drug. These effects get better in some people after the drug is stopped but it does not get better in all people.  Note: Each of the side effects above was reported in 20% or greater of patients treated with daratumumab. Not all possible side effects are included above.  Warnings and Precautions  Severe decrease in the number of white blood cells and platelets    Severe allergic reaction   This medication can affect the results of blood tests that match your blood type. Your blood type will be tested before treatment. Be sure to tell all healthcare providers you are taking this medicine before receiving blood transfusions, even for 6 months after your last dose.  Important Information   This drug may be present in the saliva, tears, sweat, urine, stool, vomit, semen, and  vaginal secretions. Talk to your doctor and/or your nurse about the necessary precautions to take during this time.  Treating Side Effects   Drink plenty of fluids (a minimum of eight glasses per day is recommended).   If you throw up or have loose bowel movements, you should drink more fluids so that you do not become dehydrated (lack of water in the body from losing too much fluid).   To help with nausea and vomiting, eat small, frequent meals instead of three large meals a day. Choose foods and drinks that are at room temperature. Ask your nurse or doctor about other helpful tips and medicine that is available to help stop or lessen these symptoms.   If you have diarrhea, eat low-fiber foods that are high in protein and calories and avoid foods that can irritate your digestive tracts or lead to cramping.   Ask your nurse or doctor about medicine that can lessen or stop your diarrhea or constipation.   If you are not able to move your bowels, check with your doctor or nurse before you use enemas, laxatives, or suppositories.   Manage tiredness by pacing your activities for the day.   Be sure to include periods of rest between energy-draining activities.   To decrease the risk of infection, wash your hands regularly.   Avoid close contact with people who have a cold, the flu, or other infections.   Take your temperature as your doctor or nurse tells you, and whenever you feel like you may have a fever.   To help decrease the risk of bleeding, use a soft toothbrush. Check with your nurse before using dental floss.   Be very careful when using knives or tools.   Use an electric shaver instead of a razor.   Keeping your pain under control is important to your well-being. Please tell your doctor or nurse if you are experiencing pain.   If you are dizzy, get up slowly after sitting or lying.   If you are having trouble sleeping, talk to your nurse or doctor on tips to help you sleep  better.   If you have numbness and tingling in your hands and feet, be careful when cooking, walking, and handling sharp objects and hot liquids.   Infusion reactions may rarely occur after your infusion. If this happens, call 911 for emergency care.  Food and Drug Interactions   There are no known interactions of daratumumab with food.   This drug may interact with other medicines. Tell your doctor and pharmacist about all the prescription and over-the-counter medicines and dietary supplements (vitamins, minerals, herbs and others) that you are taking at this time. Also, check with your doctor or pharmacist before starting any new prescription or over-the-counter medicines, or dietary supplements to make sure that there are no interactions.  When to Call the Doctor  Call your doctor or nurse if you have any of these symptoms and/or any new or unusual symptoms:   Fever of 100.4  F (38 C) or higher   Chills   Pain in your chest   Coughing up yellow, green, or bloody mucus.   Wheezing or trouble breathing   Tiredness that interferes with your daily activities   Trouble falling or staying asleep   Feeling dizzy or lightheaded   Easy bleeding or bruising   Nausea that stops you from eating or drinking and/or is not relieved by prescribed medicines   Vomiting   No bowel movement in 3 days or when you feel uncomfortable.   Loose bowel movements (diarrhea) 4 times a day or loose bowel movements with lack of strength or a feeling of being dizzy   Weight gain of 5 pounds in one week (fluid retention)   Swelling of your legs, ankles and/or feet   Pain that does not go away, or is not relieved by prescribed medicines   Signs of infusion reaction: fever or shaking chills, flushing, facial swelling, feeling dizzy, headache, trouble breathing, rash, itching, chest tightness, or chest pain. If this happens, call 911 for emergency care.   Numbness, tingling, or pain in your hands  and feet   If you think you may be pregnant or may have impregnated your partner  Reproduction Warnings   Pregnancy warning: This drug may have harmful effects on the unborn baby. Women of childbearing potential should use effective methods of birth control during your cancer treatment and for at least 3 months after treatment. Let your doctor know right away if you think you may be pregnant.   Breastfeeding warning: It is not known if this drug passes into breast milk. For this reason, women should talk to their doctor about the risks and benefits of breastfeeding during treatment with this drug because this drug may enter the breast milk and cause harm to a breastfeeding baby.   Fertility warning: Human fertility studies have not been done with this drug. Talk with your doctor or nurse if you plan to have children. Ask for information on sperm or egg banking.   Lenalidomide (Revlimid)  About This Drug Lenalidomide is used to treat multiple myeloma. It is given orally (by mouth). You will take this drug daily for three weeks in a row, then have one week off prior to restarting the cycle. Lenalidomide is a type of "immunomodulatory agent", meaning it works by affecting the immune system. It appears to work in several ways, including inhibiting the formation of blood vessels, which tumors use to get nutrients needed to survive and grow. It also interferes with chemicals necessary for the growth of tumors and can cause cell death.  Possible Side Effects  Bone marrow suppression. This is a decrease in the number of white blood cells, red blood cells, and platelets. This may raise your risk of infection, make you tired and weak (fatigue), and raise your risk of bleeding.   Nausea   Diarrhea (loose bowel movements)   Constipation (unable to move bowels)   Inflammation of your stomach and/or intestines   Pain in your abdomen or back pain   Fever   Tiredness and weakness   Swelling of  your legs, ankles and/or feet   Decreased appetite (decreased hunger)   Muscle cramps/spasms   Pain in your joints   Headache   Feeling dizzy   Tremor   Trouble sleeping   Nosebleed   Upper respiratory infection, bronchitis   Inflammation of the nasal passages and throat   Trouble breathing   Cough   Rash and  itching  Note: Each of the side effects above was reported in 15% or greater of patients treated with lenalidomide. Not all possible side effects are included above.  Warnings and Precautions  Blood clots and events such as stroke and heart attack. A blood clot in your leg may cause your leg to swell, appear red and warm, and/or cause pain. A blood clot in your lungs may cause trouble breathing, pain when breathing, and/or chest pain.   Severe bone marrow suppression   Changes in your liver function, which may cause liver failure and be life-threatening.   Tumor lysis syndrome: This drug may act on the cancer cells very quickly. This may affect how your kidneys work and can be life-threatening.   Changes in your thyroid function   Severe allergic skin reaction which may be life-threatening. You may develop blisters on your skin that are filled with fluid or a severe red rash all over your body that may be painful.   This drug may raise your risk of getting a second cancer.   You may develop a syndrome called tumor flare reaction. You may have painful lymph nodes, enlarged spleen, fever, and a rash.   This drug may make it more difficult to collect your stem cells if a stem cell transplant is part of your treatment plan.   There is a rare increased risk of death in patients with chronic lymphocytic leukemia and a risk of early death (dying sooner) in patient with mantle cell lymphoma.   Allergic reactions, including anaphylaxis are rare but may happen in some patients. Signs of allergic reaction to this drug may be swelling of the face, feeling like your tongue  or throat are swelling, trouble breathing, rash, itching, fever, chills, feeling dizzy, and/or feeling that your heart is beating in a fast or not normal way. If this happens, do not take another dose of this drug. You should get urgent medical treatment.  Note: Some of the side effects above are very rare. If you have concerns and/or questions, please discuss them with your medical team.  Important Information  You will need to sign up for a special program called Revlimid REMS when you start taking this drug. Your nurse will help you get started.   Two negative pregnancy tests are required in women of childbearing potential prior to starting treatment. Routine pregnancy tests are required during treatment.   Do not donate blood during your treatment and for 4 weeks after your treatment.   Men should not donate sperm during your treatment and for 4 weeks after your treatment because this drug is present in semen and may badly harm a baby.   How to Take Your Medication  Swallow the medicine whole with water, with or without food. Do not chew, break, or open it.   Take this medicine at about the same time each day   Missed dose: If you miss a dose, take it as soon as you think about it ONLY if it has been less than 12 hours since you normally take the missed dose. If it has been more than 12 hours, skip the missed dose and contact your physician. Take your next dose at the regular time. Do not take 2 doses at the same time and do not double up on the next dose.   If you vomit a dose, take your next dose at the regular time.   Handling: Wash your hands after handling your medicine, your caretakers should not handle  your medicine with bare hands and should wear latex gloves.   If you get any of the content of a broken capsules on your skin, you should wash the area of the skin well with soap and water right away. Call your doctor if you get a skin reaction.   This drug may be present in the  saliva, tears, sweat, urine, stool, vomit, semen, and vaginal secretions. Talk to your doctor and/or your nurse about the necessary precautions to take during this time.   Storage: Store this medicine in the original container at room temperature.   Disposal of unused medicine: Do not flush any expired and/or unused medicine down the toilet or drain unless you are specifically instructed to do so on the medication label. Some facilities have take-back programs and/or other options. If you do not have a take-back program in your area, then please discuss with your nurse or your doctor how to dispose of unused medicine.  Treating Side Effects  Manage tiredness by pacing your activities for the day.   Be sure to include periods of rest between energy-draining activities.   If you are dizzy, get up slowly after sitting or lying.   To decrease the risk of infection, wash your hands regularly.   Avoid close contact with people who have a cold, the flu, or other infections.   Take your temperature as your doctor or nurse tells you, and whenever you feel like you may have a fever.   To help decrease the risk of bleeding, use a soft toothbrush. Check with your nurse before using dental floss.   Be very careful when using knives or tools.   Use an electric shaver instead of a razor.   Ask your doctor or nurse about medicines that are available to help stop or lessen constipation and/or diarrhea.   If you are not able to move your bowels, check with your doctor or nurse before you use enemas, laxatives, or suppositories.   Drink plenty of fluids (a minimum of eight glasses per day is recommended).   Drink fluids that contribute calories (whole milk, juice, soft drinks, sweetened beverages, milkshakes, and nutritional supplements) instead of water.   If you throw up or have loose bowel movements, you should drink more fluids so that you do not become dehydrated (lack of water in the body from  losing too much fluid).   If you have diarrhea, eat low-fiber foods that are high in protein and calories and avoid foods that can irritate your digestive tracts or lead to cramping.   To help with nausea and vomiting, eat small, frequent meals instead of three large meals a day.  Choose foods and drinks that are at room temperature. Ask your nurse or doctor about other helpful tips and medicine that is available to help stop or lessen these symptoms.   To help with decreased appetite, eat small, frequent meals. Eat foods high in calories and protein, such as meat, poultry, fish, dry beans, tofu, eggs, nuts, milk, yogurt, cheese, ice cream, pudding, and nutritional supplements.   Consider using sauces and spices to increase taste. Daily exercise, with your doctor's approval, may increase your appetite.   If you get a rash, do not put anything on it unless your doctor or nurse says you may. Keep the area around the rash clean and dry. Ask your doctor for medicine if your rash bothers you.   Keeping your pain under control is important to your well-being. Please tell your  doctor or nurse if you are experiencing pain.   If you are having trouble sleeping, talk to your nurse or doctor on tips to help you sleep better.   If you have a nosebleed, sit with your head tipped slightly forward. Apply pressure by lightly pinching the bridge of your nose between your thumb and forefinger. Call your doctor if you feel dizzy or faint or if the bleeding does not stop after 10 to 15 minutes.   Moisturize your skin several times a day.   Avoid sun exposure and apply sunscreen routinely when outdoors.  Food and Drug Interactions   There are no known interactions of lenalidomide with food.   Check with your doctor or pharmacist about all other prescription medicines and over-the-counter medicines and dietary supplements (vitamins, minerals, herbs, and others) you are taking before starting this medicine as  there are known drug interactions with lenalidomide. Also, check with your doctor or pharmacist before starting any new prescription or over-the-counter medicines, or dietary supplements to make sure that there are no interactions.   There are known interactions of lenalidomide with blood-thinning medicine such as warfarin. Ask your doctor what precautions you should take.  When to Call the Doctor Call your doctor or nurse if you have any of these symptoms and/or any new or unusual symptoms:  Fever of 100.4 F (38 C) or higher   Chills   Tiredness that interferes with your daily activities   Feeling dizzy or lightheaded   Easy bleeding or bruising   Your leg or arm is swollen, red, warm, and/or painful   Headache that does not go away   Nosebleed that does not stop bleeding after 10-15 minutes   Painful lymph nodes   Wheezing and/or trouble breathing   Chest pain or symptoms of a heart attack. Most heart attacks involve pain in the center of the chest that lasts more than a few minutes. The pain may go away and come back. It can feel like pressure, squeezing, fullness, or pain. Sometimes pain is felt in one or both arms, the back, neck, jaw, or stomach. If any of these symptoms last 2 minutes, call 911.   Symptoms of a stroke such as sudden numbness or weakness of your face, arm, or leg, mostly on one side of your body; sudden confusion, trouble speaking or understanding; sudden trouble seeing in one or both eyes; sudden trouble walking, feeling dizzy, loss of balance or coordination; or sudden, bad headache with no known cause. If you have any of these symptoms for 2 minutes, call 911.   Signs of allergic reaction: swelling of the face, feeling like your tongue or throat are swelling, trouble breathing, rash, itching, fever, chills, feeling dizzy, and/or feeling that your heart is beating in a fast or not normal way. If this happens, call 911 for emergency care.   Coughing up  yellow, green, or bloody mucus   Feeling that your heart is beating in a fast or not normal way (palpitations)   Nausea that stops you from eating or drinking and/or is not relieved by prescribed medicines   Throwing up more than 3 times a day   Loose bowel movements (diarrhea) 4 times a day or loose bowel movements with lack of strength or a feeling of being dizzy   No bowel movement in 3 days or when you feel uncomfortable   Trouble falling or staying asleep   Pain in your abdomen that does not go away   Weight  gain of 5 pounds in one week (fluid retention)   Swelling of your legs, ankles and/or feet   Unexplained weight gain   Lasting loss of appetite or rapid weight loss of five pounds in a week   Pain that does not go away, or is not relieved by prescribed medicines   Flu-like symptoms: fever, headache, muscle and joint aches, and fatigue (low energy, feeling weak)   A new rash or itching that is not relieved by prescribed medicines   Signs of possible liver problems: dark urine, pale bowel movements, bad stomach pain, feeling very tired and weak, unusual itching, or yellowing of the eyes or skin   Signs of tumor lysis: confusion or agitation, decreased urine, nausea/vomiting, diarrhea, muscle cramping, numbness and/or tingling, seizures   If you think you may be pregnant or may have impregnated your partner  Reproduction Warnings   Pregnancy warning: This drug can have harmful effects on the unborn baby. Women of childbearing potential must commit to abstain from heterosexual intercourse or use 2 effective methods of birth control, one of which, must be a highly effective method of birth control, beginning at least 4 weeks before treatment starts, during your cancer treatment, including dose interruptions, and for at least 4 weeks after treatment. A highly effective method of birth control includes tubal ligation, intrauterine device (IUD), hormonal (birth control pills,  injections, patch and/or implants) or a partner's vasectomy. Stop taking lenalidomide immediately and let your doctor know right away if you think you may be pregnant, miss your menstrual period, or experience unusual menstrual bleeding.   Men with male partners of childbearing potential should use effective methods of birth control during your cancer treatment and for at least 4 weeks after your cancer treatment.    Breastfeeding warning: Women should not breastfeed during treatment because this drug could enter the breast milk and cause harm to a breastfeeding baby.   Fertility warning: Human fertility studies have not been done with this drug. Talk with your doctor or nurse if you plan to have children. Ask for information on sperm or egg banking.   Dexamethasone (Decadron)  About This Drug  Dexamethasone is used to treat cancer, to decrease inflammation and sometimes used before and after chemotherapy to prevent or treat nausea and/or vomiting. It is given in the vein (IV) or orally (by mouth).  Possible Side Effects   Headache   High blood pressure   Abnormal heart beat   Tiredness and weakness   Changes in mood, which may include depression or a feeling of extreme well-being   Trouble sleeping   Increased sweating   Increased appetite (increased hunger)   Weight gain   Increase risk of infections   Pain in your abdomen   Nausea   Skin changes such as rash, dryness, redness   Blood sugar levels may change   Electrolyte changes   Swelling of your legs, ankles and/or feet   Changes in your liver function   You may be at risk for cataracts, glaucoma or infections of the eye   Muscle loss and / or weakness (lack of muscle strength)   Increased risk of developing osteoporosis- your bones may become weak and brittle  Note: Not all possible side effects are included above.  Warnings and Precautions   This drug may cause you to feel irritable, nervous or  restless.   Allergic reactions, including anaphylaxis are rare but may happen in some patients. Signs of allergic reaction to this drug  may be swelling of the face, feeling like your tongue or throat are swelling, trouble breathing, rash, itching, fever, chills, feeling dizzy, and/or feeling that your heart is beating in a fast or not normal way. If this happens, do not take another dose of this drug. You should get urgent medical treatment.   High blood pressure and changes in electrolytes, which can cause fluid build-up around your heart, lungs or elsewhere.   Increased risk of developing a hole in your stomach, small, and/or large intestine if you have ulcers in the lining of your stomach and/or intestine, or have diverticulitis, ulcerative colitis and/or other diseases that affect the gastrointestinal tract.   Effects on the endocrine glands including the pituitary, adrenals or thyroid during or after use of this medication.   Changes in the tissue of the heart, that can cause your heart to have less ability to pump blood. You may be short of breath or our arms, hands, legs and feet may swell.   Increased risk of heart attack.   Severe depression and other psychiatric disorders such as mood changes.   Burning, pain and itching around your anus may happen when this drug is given in the vein too rapidly (IV). It usually happens suddenly and resolves in less than 1 minute.  Important Information   Talk to your doctor or your nurse before stopping this medication, it should be stopped gradually. Depending on the dose and length of treatment, you could experience serious side effects if stopped abruptly (suddenly).   Talk to your doctor before receiving any vaccinations during your treatment. Some vaccinations are not recommended while receiving dexamethasone.  How to Take Your Medication   For Oral (by mouth): You can take the medicine with or without food. If you have nausea or upset  stomach, take it with food.   Missed dose: If you miss a dose, do not take 2 doses at the same time or extra doses.  If you vomit a dose, take your next dose at the regular time. Do not take 2 doses at the same time   Handling: Wash your hands after handling your medicine, your caretakers should not handle your medicine with bare hands and should wear latex gloves.   Storage: Store this medicine in the original container at room temperature. Protect from moisture and light. Discuss with your nurse or your doctor how to dispose of unused medicine.   Treating Side Effects   Drink plenty of fluids (a minimum of eight glasses per day is recommended).   To help with nausea and vomiting, eat small, frequent meals instead of three large meals a day. Choose foods and drinks that are at room temperature. Ask your nurse or doctor about other helpful tips and medicine that is available to help stop or lessen these symptoms.   If you throw up, you should drink more fluids so that you do not become dehydrated (lack of water in the body from losing too much fluid).   Manage tiredness by pacing your activities for the day.   Be sure to include periods of rest between energy-draining activities.   To help with muscle weakness, get regular exercise. If you feel too tired to exercise vigorously, try taking a short walk.   If you are having trouble sleeping, talk to your nurse or doctor on tips to help you sleep better.   If you are feeling depressed, talk to your nurse or doctor about it.   Keeping your pain under  control is important to your well-being. Please tell your doctor or nurse if you are experiencing pain.   If you have diabetes, keep good control of your blood sugar level. Tell your nurse or your doctor if your glucose levels are higher or lower than normal.   To decrease the risk of infection, wash your hands regularly.   Avoid close contact with people who have a cold, the flu, or other  infections.   Take your temperature as your doctor or nurse tells you, and whenever you feel like you may have a fever.   If you get a rash do not put anything on it unless your doctor or nurse says you may. Keep the area around the rash clean and dry. Ask your doctor for medicine if your rash bothers you.   Moisturize your skin several times day.   Avoid sun exposure and apply sunscreen routinely when outdoors.  Food and Drug Interactions   There are no known interactions of dexamethasone with food.   Check with your doctor or pharmacist about all other prescription medicines and over-the-counter medicines and dietary supplements (vitamins, minerals, herbs and others) you are taking before starting this medicine as there are known drug interactions with dexamethasone. Also, check with your doctor or pharmacist before starting any new prescription or over-the-counter medicines, or dietary supplement to make sure that there are no interactions.   There are known interactions of dexamethasone with other medicines and products like acetaminophen, aspirin, and ibuprofen. Ask your doctor what over-the-counter (OTC) medicines you can take.  When to Call the Doctor  Call your doctor or nurse if you have any of these symptoms and/or any new or unusual symptoms:   Fever of 100.4 F (38 C) or higher   Chills   A headache that does not go away   Trouble breathing   Blurry vision or other changes in eyesight   Feel irritable, nervous or restless   Trouble falling or staying asleep   Severe mood changes such as depression or unusual thoughts and/or behaviors   Thoughts of hurting yourself or others, and suicide   Tiredness that interferes with your daily activities   Feeling that your heart is beating in a fast, slow or not normal way   Feeling dizzy or lightheaded   Chest pain or symptoms of a heart attack. Most heart attacks involve pain in the center of the chest that lasts more  than a few minutes. The pain may go away and come back, or it can be constant. It can feel like pressure, squeezing, fullness, or pain. Sometimes pain is felt in one or both arms, the back, neck, jaw, or stomach. If any of these symptoms last 2 minutes, call 911.   Heartburn or indigestion   Nausea that stops you from eating or drinking and/or is not relieved by prescribed medicines   Throwing up more than 3 times a day   Pain in your abdomen that does not go away   Abnormal blood sugar   Unusual thirst, passing urine often, headache, sweating, shakiness, irritability   Swelling of legs, ankles, or feet   Weight gain of 5 pounds in one week (fluid retention)   Signs of possible liver problems: dark urine, pale bowel movements, bad stomach pain, feeling very tired and weak, unusual itching, or yellowing of the eyes or skin   Severe muscle weakness   A new rash or a rash that is not relieved by prescribed medicines  Signs of allergic reaction: swelling of the face, feeling like your tongue or throat are swelling, trouble breathing, rash, itching, fever, chills, feeling dizzy, and/or feeling that your heart is beating in a fast or not normal way. If this happens, call 911 for emergency care.   If you think you may be pregnant  Reproduction Warnings   Pregnancy warning: It is not known if this drug may harm an unborn child. For this reason, be sure to talk with your doctor if you are pregnant or planning to become pregnant while receiving this drug. Let your doctor know right away if you think you may be pregnant or may have impregnated your partner.   Breastfeeding warning: It is not known if this drug passes into breast milk. For this reason, women should talk to their doctor about the risks and benefits of breastfeeding during treatment with this drug because this drug may enter the breast milk and cause harm to a breastfeeding baby.   Fertility warning: Human fertility studies have  not been done with this drug. Talk with your doctor or nurse if you plan to have children. Ask for information on sperm banking.    SELF CARE ACTIVITIES WHILE ON CHEMOTHERAPY/IMMUNOTHERAPY:  Hydration Increase your fluid intake 48 hours prior to treatment and drink at least 8 to 12 cups (64 ounces) of water/decaffeinated beverages per day after treatment. You can still have your cup of coffee or soda but these beverages do not count as part of your 8 to 12 cups that you need to drink daily. No alcohol intake.  Medications Continue taking your normal prescription medication as prescribed.  If you start any new herbal or new supplements please let us know first to make sure it is safe.  Mouth Care Have teeth cleaned professionally before starting treatment. Keep dentures and partial plates clean. Use soft toothbrush and do not use mouthwashes that contain alcohol. Biotene is a good mouthwash that is available at most pharmacies or may be ordered by calling [phone]. Use warm salt water gargles (1 teaspoon salt per 1 quart warm water) before and after meals and at bedtime. Or you may rinse with 2 tablespoons of three-percent hydrogen peroxide mixed in eight ounces of water. If you are still having problems with your mouth or sores in your mouth please call the clinic. If you need dental work, please let the doctor know before you go for your appointment so that we can coordinate the best possible time for you in regards to your chemo regimen. You need to also let your dentist know that you are actively taking chemo. We may need to do labs prior to your dental appointment.  Skin Care Always use sunscreen that has not expired and with SPF (Sun Protection Factor) of 50 or higher. Wear hats to protect your head from the sun. Remember to use sunscreen on your hands, ears, face, & feet.  Use good moisturizing lotions such as udder cream, eucerin, or even Vaseline. Some chemotherapies can cause dry  skin, color changes in your skin and nails.    Avoid long, hot showers or baths. Use gentle, fragrance-free soaps and laundry detergent. Use moisturizers, preferably creams or ointments rather than lotions because the thicker consistency is better at preventing skin dehydration. Apply the cream or ointment within 15 minutes of showering. Reapply moisturizer at night, and moisturize your hands every time after you wash them.   Infection Prevention Please wash your hands for at least 30 seconds using  warm soapy water. Handwashing is the #1 way to prevent the spread of germs. Stay away from sick people or people who are getting over a cold. If you develop respiratory systems such as green/yellow mucus production or productive cough or persistent cough let us know and we will see if you need an antibiotic. It is a good idea to keep a pair of gloves on when going into grocery stores/[employer] to decrease your risk of coming into contact with germs on the carts, etc. Carry alcohol hand gel with you at all times and use it frequently if out in public. If your temperature reaches 100.5 or higher please call the clinic and let us know.  If it is after hours or on the weekend please go to the ER if your temperature is over 100.4.  Please have your own personal thermometer at home to use.    Sex and bodily fluids If you are going to have sex, a condom must be used to protect the person that isn't taking immunotherapy. For a few days after treatment, immunotherapy can be excreted through your bodily fluids.  When using the toilet please close the lid and flush the toilet twice.  Do this for a few day after you have had immunotherapy.   Contraception It is not known for sure whether or not immunotherapy drugs can be passed on through semen or secretions from the vagina. Because of this some doctors advise people to use a barrier method if you have sex during treatment. This applies to vaginal, anal or oral  sex.  Generally, doctors advise a barrier method only for the time you are actually having the treatment and for about a week after your treatment.  Advice like this can be worrying, but this does not mean that you have to avoid being intimate with your partner. You can still have close contact with your partner and continue to enjoy sex.  Animals If you have cats or birds we just ask that you not change the litter or change the cage.  Please have someone else do this for you while you are on immunotherapy.   Food Safety During and After Cancer Treatment Food safety is important for people both during and after cancer treatment. Cancer and cancer treatments, such as chemotherapy, radiation therapy, and stem cell/bone marrow transplantation, often weaken the immune system. This makes it harder for your body to protect itself from foodborne illness, also called food poisoning. Foodborne illness is caused by eating food that contains harmful bacteria, parasites, or viruses.  Foods to avoid Some foods have a higher risk of becoming tainted with bacteria. These include: Unwashed fresh fruit and vegetables, especially leafy vegetables that can hide dirt and other contaminants Raw sprouts, such as alfalfa sprouts Raw or undercooked beef, especially ground beef, or other raw or undercooked meat and poultry Fatty, fried, or spicy foods immediately before or after treatment.  These can sit heavy on your stomach and make you feel nauseous. Raw or undercooked shellfish, such as oysters. Sushi and sashimi, which often contain raw fish.  Unpasteurized beverages, such as unpasteurized fruit juices, raw milk, raw yogurt, or cider Undercooked eggs, such as soft boiled, over easy, and poached; raw, unpasteurized eggs; or foods made with raw egg, such as homemade raw cookie dough and homemade mayonnaise  Simple steps for food safety  Shop smart. Do not buy food stored or displayed in an unclean area. Do not  buy bruised or damaged fruits or vegetables. Do  not buy cans that have cracks, dents, or bulges. Pick up foods that can spoil at the end of your shopping trip and store them in a cooler on the way home.  Prepare and clean up foods carefully. Rinse all fresh fruits and vegetables under running water, and dry them with a clean towel or paper towel. Clean the top of cans before opening them. After preparing food, wash your hands for 20 seconds with hot water and soap. Pay special attention to areas between fingers and under nails. Clean your utensils and dishes with hot water and soap. Disinfect your kitchen and cutting boards using 1 teaspoon of liquid, unscented bleach mixed into 1 quart of water.    Dispose of old food. Eat canned and packaged food before its expiration date (the "use by" or "best before" date). Consume refrigerated leftovers within 3 to 4 days. After that time, throw out the food. Even if the food does not smell or look spoiled, it still may be unsafe. Some bacteria, such as Listeria, can grow even on foods stored in the refrigerator if they are kept for too long.  Take precautions when eating out. At restaurants, avoid buffets and salad bars where food sits out for a long time and comes in contact with many people. Food can become contaminated when someone with a virus, often a norovirus, or another "bug" handles it. Put any leftover food in a "to-go" container yourself, rather than having the server do it. And, refrigerate leftovers as soon as you get home. Choose restaurants that are clean and that are willing to prepare your food as you order it cooked.    SYMPTOMS TO REPORT AS SOON AS POSSIBLE AFTER TREATMENT:  FEVER GREATER THAN 100.4 F CHILLS WITH OR WITHOUT FEVER NAUSEA AND VOMITING THAT IS NOT CONTROLLED WITH YOUR NAUSEA MEDICATION UNUSUAL SHORTNESS OF BREATH UNUSUAL BRUISING OR BLEEDING TENDERNESS IN MOUTH AND THROAT WITH OR WITHOUT PRESENCE OF ULCERS URINARY  PROBLEMS BOWEL PROBLEMS UNUSUAL RASH     Wear comfortable clothing and clothing appropriate for easy access to any Portacath or PICC line. Let us know if there is anything that we can do to make your therapy better!   What to do if you need assistance after hours or on the weekends: CALL [phone].  HOLD on the line, do not hang up.  You will hear multiple messages but at the end you will be connected with a nurse triage line.  They will contact the doctor if necessary.  Most of the time they will be able to assist you.  Do not call the hospital operator.    I have been informed and understand all of the instructions given to me and have received a copy. I have been instructed to call the clinic [phone] or my family physician as soon as possible for continued medical care, if indicated. I do not have any more questions at this time but understand that I may call the [place] or the Patient Navigator at [phone] during office hours should I have questions or need assistance in obtaining follow-up care.

## 2022-04-23 ENCOUNTER — Inpatient Hospital Stay: Payer: Medicare Other

## 2022-04-23 ENCOUNTER — Telehealth (HOSPITAL_COMMUNITY): Payer: Self-pay | Admitting: *Deleted

## 2022-04-23 NOTE — Telephone Encounter (Signed)
Patient called to advise he has been diagnosed with a URI per PCP.  CXR was clear and is being treated with PO antibiotics.  Negative for flu and covid per patient.  Treatment and teaching will be rescheduled.  Patient aware of 1/30 chemo start.

## 2022-04-23 NOTE — Progress Notes (Signed)
Pharmacist Chemotherapy Monitoring - Initial Assessment    Anticipated start date: 05/01/22   The following has been reviewed per standard work regarding the patient's treatment regimen: The patient's diagnosis, treatment plan and drug doses, and organ/hematologic function Lab orders and baseline tests specific to treatment regimen  The treatment plan start date, drug sequencing, and pre-medications Prior authorization status  Patient's documented medication list, including drug-drug interaction screen and prescriptions for anti-emetics and supportive care specific to the treatment regimen The drug concentrations, fluid compatibility, administration routes, and timing of the medications to be used The patient's access for treatment and lifetime cumulative dose history, if applicable  The patient's medication allergies and previous infusion related reactions, if applicable   Changes made to treatment plan:  treatment plan date  Follow up needed:  Labs- pretreatment phenotype and cross and type blood   Wynona Neat, RPH, 04/23/2022  1:17 PM .

## 2022-04-24 ENCOUNTER — Ambulatory Visit: Payer: Medicare Other

## 2022-04-25 ENCOUNTER — Telehealth: Payer: Self-pay | Admitting: Pharmacist

## 2022-04-25 DIAGNOSIS — J019 Acute sinusitis, unspecified: Secondary | ICD-10-CM | POA: Diagnosis not present

## 2022-04-25 NOTE — Telephone Encounter (Signed)
Received a call from the patient's wife, letting me know [name] [name] treatment start was delayed until 05/01/22 and requesting an updated medication calendar.   Med calendar updated and placed in the mail today

## 2022-04-26 DIAGNOSIS — L97511 Non-pressure chronic ulcer of other part of right foot limited to breakdown of skin: Secondary | ICD-10-CM | POA: Diagnosis not present

## 2022-04-27 ENCOUNTER — Other Ambulatory Visit: Payer: Self-pay

## 2022-04-27 DIAGNOSIS — C9000 Multiple myeloma not having achieved remission: Secondary | ICD-10-CM

## 2022-04-30 ENCOUNTER — Inpatient Hospital Stay: Payer: Medicare Other

## 2022-04-30 DIAGNOSIS — Z5112 Encounter for antineoplastic immunotherapy: Secondary | ICD-10-CM | POA: Diagnosis not present

## 2022-04-30 DIAGNOSIS — G629 Polyneuropathy, unspecified: Secondary | ICD-10-CM | POA: Diagnosis not present

## 2022-04-30 DIAGNOSIS — Z79899 Other long term (current) drug therapy: Secondary | ICD-10-CM | POA: Diagnosis not present

## 2022-04-30 DIAGNOSIS — C9000 Multiple myeloma not having achieved remission: Secondary | ICD-10-CM | POA: Diagnosis not present

## 2022-04-30 DIAGNOSIS — D472 Monoclonal gammopathy: Secondary | ICD-10-CM | POA: Diagnosis not present

## 2022-04-30 LAB — COMPREHENSIVE METABOLIC PANEL
ALT: 10 U/L (ref 0–44)
AST: 21 U/L (ref 15–41)
Albumin: 2.6 g/dL — ABNORMAL LOW (ref 3.5–5.0)
Alkaline Phosphatase: 62 U/L (ref 38–126)
Anion gap: 6 (ref 5–15)
BUN: 19 mg/dL (ref 8–23)
CO2: 22 mmol/L (ref 22–32)
Calcium: 8.1 mg/dL — ABNORMAL LOW (ref 8.9–10.3)
Chloride: 103 mmol/L (ref 98–111)
Creatinine, Ser: 1.39 mg/dL — ABNORMAL HIGH (ref 0.61–1.24)
GFR, Estimated: 52 mL/min — ABNORMAL LOW (ref >60)
Glucose, Bld: 141 mg/dL — ABNORMAL HIGH (ref 70–99)
Potassium: 3.7 mmol/L (ref 3.5–5.1)
Sodium: 131 mmol/L — ABNORMAL LOW (ref 135–145)
Total Bilirubin: 0.7 mg/dL (ref 0.3–1.2)
Total Protein: 11.2 g/dL — ABNORMAL HIGH (ref 6.5–8.1)

## 2022-04-30 LAB — CBC WITH DIFFERENTIAL/PLATELET
Abs Immature Granulocytes: 0.01 10*3/uL (ref 0.00–0.07)
Basophils Absolute: 0.1 10*3/uL (ref 0.0–0.1)
Basophils Relative: 1 %
Eosinophils Absolute: 0.5 10*3/uL (ref 0.0–0.5)
Eosinophils Relative: 7 %
HCT: 32.5 % — ABNORMAL LOW (ref 39.0–52.0)
Hemoglobin: 11 g/dL — ABNORMAL LOW (ref 13.0–17.0)
Immature Granulocytes: 0 %
Lymphocytes Relative: 29 %
Lymphs Abs: 2.1 10*3/uL (ref 0.7–4.0)
MCH: 35.7 pg — ABNORMAL HIGH (ref 26.0–34.0)
MCHC: 33.8 g/dL (ref 30.0–36.0)
MCV: 105.5 fL — ABNORMAL HIGH (ref 80.0–100.0)
Monocytes Absolute: 0.7 10*3/uL (ref 0.1–1.0)
Monocytes Relative: 9 %
Neutro Abs: 4 10*3/uL (ref 1.7–7.7)
Neutrophils Relative %: 54 %
Platelets: 273 10*3/uL (ref 150–400)
RBC: 3.08 MIL/uL — ABNORMAL LOW (ref 4.22–5.81)
RDW: 15.8 % — ABNORMAL HIGH (ref 11.5–15.5)
WBC: 7.3 10*3/uL (ref 4.0–10.5)
nRBC: 0 % (ref 0.0–0.2)

## 2022-04-30 LAB — MAGNESIUM: Magnesium: 1.6 mg/dL — ABNORMAL LOW (ref 1.7–2.4)

## 2022-04-30 MED ORDER — PROCHLORPERAZINE MALEATE 10 MG PO TABS: 10.0000 mg | ORAL_TABLET | Freq: Four times a day (QID) | ORAL | 3 refills | Status: AC | PRN

## 2022-04-30 NOTE — Progress Notes (Signed)
Immunotherapy education packet given and discussed with pt and spouse in detail.  Discussed diagnosis, staging, tx regimen, and intent of tx.  Reviewed immunotherapy medications and side effects.  Instructed on how to manage side effects at home, and when to call the clinic.  Importance of fever/chills discussed with pt and family. Discussed precautions to implement at home after receiving tx, as well as self care strategies. Phone numbers provided for clinic during regular working hours, also how to reach the clinic after hours and on weekends. Pt and spouse provided the opportunity to ask questions - all questions answered to pt's satisfaction.    

## 2022-05-01 ENCOUNTER — Inpatient Hospital Stay: Payer: Medicare Other

## 2022-05-01 ENCOUNTER — Ambulatory Visit: Payer: Medicare Other | Admitting: Hematology

## 2022-05-01 ENCOUNTER — Other Ambulatory Visit: Payer: Medicare Other

## 2022-05-01 VITALS — BP 142/75 | HR 76 | Temp 96.2°F | Resp 20 | Wt 184.0 lb

## 2022-05-01 DIAGNOSIS — C9000 Multiple myeloma not having achieved remission: Secondary | ICD-10-CM | POA: Diagnosis not present

## 2022-05-01 DIAGNOSIS — G629 Polyneuropathy, unspecified: Secondary | ICD-10-CM | POA: Diagnosis not present

## 2022-05-01 DIAGNOSIS — D472 Monoclonal gammopathy: Secondary | ICD-10-CM | POA: Diagnosis not present

## 2022-05-01 DIAGNOSIS — Z79899 Other long term (current) drug therapy: Secondary | ICD-10-CM | POA: Diagnosis not present

## 2022-05-01 DIAGNOSIS — Z5112 Encounter for antineoplastic immunotherapy: Secondary | ICD-10-CM | POA: Diagnosis not present

## 2022-05-01 LAB — TYPE AND SCREEN
ABO/RH(D): B POS
Antibody Screen: NEGATIVE

## 2022-05-01 MED ORDER — ACETAMINOPHEN 325 MG PO TABS
650.0000 mg | ORAL_TABLET | Freq: Once | ORAL | Status: AC
  Administered 2022-05-01: 650 mg via ORAL
  Filled 2022-05-01: qty 2

## 2022-05-01 MED ORDER — CETIRIZINE HCL 10 MG PO TABS
10.0000 mg | ORAL_TABLET | Freq: Once | ORAL | Status: AC
  Administered 2022-05-01: 10 mg via ORAL
  Filled 2022-05-01: qty 1

## 2022-05-01 MED ORDER — MAGNESIUM OXIDE 400 MG PO TABS
400.0000 mg | ORAL_TABLET | Freq: Once | ORAL | Status: AC
  Administered 2022-05-01: 400 mg via ORAL
  Filled 2022-05-01: qty 1

## 2022-05-01 MED ORDER — MONTELUKAST SODIUM 10 MG PO TABS
10.0000 mg | ORAL_TABLET | Freq: Once | ORAL | Status: AC
  Administered 2022-05-01: 10 mg via ORAL
  Filled 2022-05-01: qty 1

## 2022-05-01 MED ORDER — DARATUMUMAB-HYALURONIDASE-FIHJ 1800-30000 MG-UT/15ML ~~LOC~~ SOLN
1800.0000 mg | Freq: Once | SUBCUTANEOUS | Status: AC
  Administered 2022-05-01: 1800 mg via SUBCUTANEOUS
  Filled 2022-05-01: qty 15

## 2022-05-01 MED ORDER — DIPHENHYDRAMINE HCL 25 MG PO CAPS: 50.0000 mg | ORAL_CAPSULE | Freq: Once | ORAL | Status: DC

## 2022-05-01 NOTE — Progress Notes (Signed)
Pt presents today for C1D1 Daratumumab [place] per provider's order. Vital signs and labs WNL for treatment. Pt's magnesium is 1.6 today. Dr.K made aware and stated to give 400 mg magnesium oxide p.o x 1 dose.  Peripheral IV started with good blood return. Pt voiced no complaints after 2 hour wait time from receiving Dara [place].  Dara [place] and 400 mg magnesium oxide p.o x 1 dose given today per MD orders. Tolerated infusion without adverse affects. Vital signs stable. No complaints at this time. Discharged from clinic ambulatory in stable condition. Alert and oriented x 3. F/U with [facility] as scheduled.

## 2022-05-01 NOTE — Patient Instructions (Signed)
[facility]  Discharge Instructions: Thank you for choosing [facility] to provide your oncology and hematology care.  If you have a lab appointment with the [facility], please come in thru the Main Entrance and check in at the main information desk.  Wear comfortable clothing and clothing appropriate for easy access to any Portacath or PICC line.   We strive to give you quality time with your provider. You may need to reschedule your appointment if you arrive late (15 or more minutes).  Arriving late affects you and other patients whose appointments are after yours.  Also, if you miss three or more appointments without notifying the office, you may be dismissed from the clinic at the provider's discretion.      For prescription refill requests, have your pharmacy contact our office and allow 72 hours for refills to be completed.    Today you received the following chemotherapy and/or immunotherapy agents C1D1 Daratumumab   To help prevent nausea and vomiting after your treatment, we encourage you to take your nausea medication as directed.   Daratumumab; Hyaluronidase Injection What is this medication? DARATUMUMAB; HYALURONIDASE (dar a toom ue mab; hye al ur ON i dase) treats multiple myeloma, a type of bone marrow cancer. Daratumumab works by blocking a protein that causes cancer cells to grow and multiply. This helps to slow or stop the spread of cancer cells. Hyaluronidase works by increasing the absorption of other medications in the body to help them work better. This medication may also be used treat amyloidosis, a condition that causes the buildup of a protein (amyloid) in your body. It works by reducing the buildup of this protein, which decreases symptoms. It is a combination medication that contains a monoclonal antibody. This medicine may be used for other purposes; ask your health care provider or pharmacist if you have questions. COMMON BRAND  NAME(S): DARZALEX FASPRO What should I tell my care team before I take this medication? They need to know if you have any of these conditions: Heart disease Infection, such as chickenpox, cold sores, herpes, hepatitis B Lung or breathing disease An unusual or allergic reaction to daratumumab, hyaluronidase, other medications, foods, dyes, or preservatives Pregnant or trying to get pregnant Breast-feeding How should I use this medication? This medication is injected under the skin. It is given by your care team in a hospital or clinic setting. Talk to your care team about the use of this medication in children. Special care may be needed. Overdosage: If you think you have taken too much of this medicine contact a poison control center or emergency room at once. NOTE: This medicine is only for you. Do not share this medicine with others. What if I miss a dose? Keep appointments for follow-up doses. It is important not to miss your dose. Call your care team if you are unable to keep an appointment. What may interact with this medication? Interactions have not been studied. This list may not describe all possible interactions. Give your health care provider a list of all the medicines, herbs, non-prescription drugs, or dietary supplements you use. Also tell them if you smoke, drink alcohol, or use illegal drugs. Some items may interact with your medicine. What should I watch for while using this medication? Your condition will be monitored carefully while you are receiving this medication. This medication can cause serious allergic reactions. To reduce your risk, your care team may give you other medication to take before receiving  this one. Be sure to follow the directions from your care team. This medication can affect the results of blood tests to match your blood type. These changes can last for up to 6 months after the final dose. Your care team will do blood tests to match your blood type  before you start treatment. Tell all of your care team that you are being treated with this medication before receiving a blood transfusion. This medication can affect the results of some tests used to determine treatment response; extra tests may be needed to evaluate response. Talk to your care team if you wish to become pregnant or think you are pregnant. This medication can cause serious birth defects if taken during pregnancy and for 3 months after the last dose. A reliable form of contraception is recommended while taking this medication and for 3 months after the last dose. Talk to your care team about effective forms of contraception. Do not breast-feed while taking this medication. What side effects may I notice from receiving this medication? Side effects that you should report to your care team as soon as possible: Allergic reactions--skin rash, itching, hives, swelling of the face, lips, tongue, or throat Heart rhythm changes--fast or irregular heartbeat, dizziness, feeling faint or lightheaded, chest pain, trouble breathing Infection--fever, chills, cough, sore throat, wounds that don't heal, pain or trouble when passing urine, general feeling of discomfort or being unwell Infusion reactions--chest pain, shortness of breath or trouble breathing, feeling faint or lightheaded Sudden eye pain or change in vision such as blurry vision, seeing halos around lights, vision loss Unusual bruising or bleeding Side effects that usually do not require medical attention (report to your care team if they continue or are bothersome): Constipation Diarrhea Fatigue Nausea Pain, tingling, or numbness in the hands or feet Swelling of the ankles, hands, or feet This list may not describe all possible side effects. Call your doctor for medical advice about side effects. You may report side effects to FDA at 1-800-FDA-1088. Where should I keep my medication? This medication is given in a hospital or  clinic. It will not be stored at home. NOTE: This sheet is a summary. It may not cover all possible information. If you have questions about this medicine, talk to your doctor, pharmacist, or health care provider.  2023 Elsevier/Gold Standard (2021-07-12 00:00:00)  BELOW ARE SYMPTOMS THAT SHOULD BE REPORTED IMMEDIATELY: *FEVER GREATER THAN 100.4 F (38 C) OR HIGHER *CHILLS OR SWEATING *NAUSEA AND VOMITING THAT IS NOT CONTROLLED WITH YOUR NAUSEA MEDICATION *UNUSUAL SHORTNESS OF BREATH *UNUSUAL BRUISING OR BLEEDING *URINARY PROBLEMS (pain or burning when urinating, or frequent urination) *BOWEL PROBLEMS (unusual diarrhea, constipation, pain near the anus) TENDERNESS IN MOUTH AND THROAT WITH OR WITHOUT PRESENCE OF ULCERS (sore throat, sores in mouth, or a toothache) UNUSUAL RASH, SWELLING OR PAIN  UNUSUAL VAGINAL DISCHARGE OR ITCHING   Items with * indicate a potential emergency and should be followed up as soon as possible or go to the Emergency Department if any problems should occur.  Please show the CHEMOTHERAPY ALERT CARD or IMMUNOTHERAPY ALERT CARD at check-in to the Emergency Department and triage nurse.  Should you have questions after your visit or need to cancel or reschedule your appointment, please contact [facility] [phone]  and follow the prompts.  Office hours are 8:00 a.m. to 4:30 p.m. Monday - Friday. Please note that voicemails left after 4:00 p.m. may not be returned until the following business day.  We are  closed weekends and major holidays. You have access to a nurse at all times for urgent questions. Please call the main number to the clinic [phone] and follow the prompts.  For any non-urgent questions, you may also contact your provider using MyChart. We now offer e-Visits for anyone 23 and older to request care online for non-urgent symptoms. For details visit [email or web address].   Also download the MyChart app! Go to the app store,  search "MyChart", open the app, select [facility], and log in with your MyChart username and password.

## 2022-05-02 NOTE — Progress Notes (Signed)
24 hour call back: Left message on the home phone answering machine. Called the mobile phone number for his wife and left message. Called patient's mobile number and no answer [phone]. Message left with clinic number to return call back if any concerns.

## 2022-05-03 LAB — PRETREATMENT RBC PHENOTYPE

## 2022-05-09 ENCOUNTER — Encounter: Payer: Self-pay | Admitting: Hematology

## 2022-05-09 ENCOUNTER — Ambulatory Visit: Payer: Medicare Other

## 2022-05-09 ENCOUNTER — Inpatient Hospital Stay: Payer: Medicare Other | Attending: Hematology | Admitting: Hematology

## 2022-05-09 ENCOUNTER — Inpatient Hospital Stay: Payer: Medicare Other

## 2022-05-09 ENCOUNTER — Other Ambulatory Visit: Payer: Medicare Other

## 2022-05-09 ENCOUNTER — Other Ambulatory Visit: Payer: Self-pay

## 2022-05-09 VITALS — BP 136/76 | HR 95 | Temp 97.1°F | Resp 19 | Ht 68.0 in | Wt 177.3 lb

## 2022-05-09 DIAGNOSIS — C9000 Multiple myeloma not having achieved remission: Secondary | ICD-10-CM

## 2022-05-09 DIAGNOSIS — Z5112 Encounter for antineoplastic immunotherapy: Secondary | ICD-10-CM | POA: Diagnosis not present

## 2022-05-09 LAB — CBC WITH DIFFERENTIAL/PLATELET
Abs Immature Granulocytes: 0.03 10*3/uL (ref 0.00–0.07)
Basophils Absolute: 0 10*3/uL (ref 0.0–0.1)
Basophils Relative: 0 %
Eosinophils Absolute: 0.9 10*3/uL — ABNORMAL HIGH (ref 0.0–0.5)
Eosinophils Relative: 12 %
HCT: 35.5 % — ABNORMAL LOW (ref 39.0–52.0)
Hemoglobin: 12.1 g/dL — ABNORMAL LOW (ref 13.0–17.0)
Immature Granulocytes: 0 %
Lymphocytes Relative: 9 %
Lymphs Abs: 0.6 10*3/uL — ABNORMAL LOW (ref 0.7–4.0)
MCH: 35.6 pg — ABNORMAL HIGH (ref 26.0–34.0)
MCHC: 34.1 g/dL (ref 30.0–36.0)
MCV: 104.4 fL — ABNORMAL HIGH (ref 80.0–100.0)
Monocytes Absolute: 0.4 10*3/uL (ref 0.1–1.0)
Monocytes Relative: 5 %
Neutro Abs: 5.2 10*3/uL (ref 1.7–7.7)
Neutrophils Relative %: 74 %
Platelets: 224 10*3/uL (ref 150–400)
RBC: 3.4 MIL/uL — ABNORMAL LOW (ref 4.22–5.81)
RDW: 16.3 % — ABNORMAL HIGH (ref 11.5–15.5)
WBC: 7.1 10*3/uL (ref 4.0–10.5)
nRBC: 0 % (ref 0.0–0.2)

## 2022-05-09 MED ORDER — LENALIDOMIDE 10 MG PO CAPS: 10.0000 mg | ORAL_CAPSULE | Freq: Every day | ORAL | 0 refills | Status: DC

## 2022-05-09 MED ORDER — DARATUMUMAB-HYALURONIDASE-FIHJ 1800-30000 MG-UT/15ML ~~LOC~~ SOLN
1800.0000 mg | Freq: Once | SUBCUTANEOUS | Status: AC
  Administered 2022-05-09: 1800 mg via SUBCUTANEOUS
  Filled 2022-05-09: qty 15

## 2022-05-09 MED ORDER — ACETAMINOPHEN 325 MG PO TABS
650.0000 mg | ORAL_TABLET | Freq: Once | ORAL | Status: AC
  Administered 2022-05-09: 650 mg via ORAL
  Filled 2022-05-09: qty 2

## 2022-05-09 MED ORDER — CETIRIZINE HCL 10 MG PO TABS
10.0000 mg | ORAL_TABLET | Freq: Once | ORAL | Status: AC
  Administered 2022-05-09: 10 mg via ORAL
  Filled 2022-05-09: qty 1

## 2022-05-09 MED ORDER — MONTELUKAST SODIUM 10 MG PO TABS
10.0000 mg | ORAL_TABLET | Freq: Once | ORAL | Status: AC
  Administered 2022-05-09: 10 mg via ORAL
  Filled 2022-05-09: qty 1

## 2022-05-09 NOTE — Progress Notes (Signed)
[facility] [place], [place] [address]   CLINIC:  Medical Oncology/Hematology  PCP:  Celene Squibb, MD [address] [place] [address]  [phone]  REASON FOR VISIT:  Follow-up for IgG kappa multiple myeloma  PRIOR THERAPY: none  CURRENT THERAPY: Daratumumab, Revlimid and dexamethasone  INTERVAL HISTORY:  [name], a 77 y.o. male, seen for follow-up of multiple myeloma. He was last seen by me on 04/11/22.  Today, he states that he is doing well overall. His appetite level is at 75%. His energy level is at 0%. He states that he did well with his last treatment. He denies any diarrhea. He is compliant with Revlimid use.  He reports mild, intermittent dizziness with standing quickly at night when he has to get up. He has been moving more slowly to mitigate the dizziness. He does not check his blood pressure at home as he believes that his home BP cuff is inaccurate.   He continues to take Gabapentin QID for his neuropathy. He continues to have 7/10 bilateral foot pain with accompanying tingling. He denies any worsening of symptoms.  REVIEW OF SYSTEMS:  Review of Systems  Constitutional:  Positive for fatigue. Negative for chills and fever.  HENT:   Negative for lump/mass, mouth sores, nosebleeds, sore throat and trouble swallowing.   Respiratory:  Positive for shortness of breath. Negative for cough.   Cardiovascular:  Negative for chest pain, leg swelling and palpitations.  Gastrointestinal:  Negative for abdominal pain, constipation, diarrhea, nausea and vomiting.  Genitourinary:  Negative for bladder incontinence, difficulty urinating, dysuria, frequency, hematuria and nocturia.   Musculoskeletal:  Positive for myalgias (bilateral feet-neuropathy). Negative for arthralgias, back pain, flank pain and neck pain.  Skin:  Negative for itching and rash.  Neurological:  Positive for dizziness. Negative for headaches and numbness.        + tingling in feet  Hematological:  Does not bruise/bleed easily.  Psychiatric/Behavioral:  Positive for sleep disturbance. Negative for depression and suicidal ideas. The patient is not nervous/anxious.   All other systems reviewed and are negative.   PAST MEDICAL/SURGICAL HISTORY:  Past Medical History:  Diagnosis Date   Adverse effect of unspecified drugs, medicaments and biological substances, initial encounter    Essential (primary) hypertension    Hyperlipidemia, unspecified    Impotence of organic origin    Malignant melanoma of skin, unspecified ([facility])    Malignant neoplasm of prostate ([facility])    Multiple myeloma (HCC)    Neuropathy    Non-pressure chronic ulcer of other part of unspecified foot with unspecified severity (HCC)    Reflux esophagitis    Restless legs syndrome    Tinea unguium    Type 2 diabetes mellitus with diabetic neuropathy, unspecified ([facility])    Type 2 diabetes mellitus with other specified complication (HCC)    Unspecified atrial fibrillation (HCC)    Past Surgical History:  Procedure Laterality Date   CATARACT EXTRACTION     HERNIA REPAIR     melanoma removal     PROSTATE SURGERY      SOCIAL HISTORY:  Social History   Socioeconomic History   Marital status: Married    Spouse name: Not on file   Number of children: Not on file   Years of education: Not on file   Highest education level: Not on file  Occupational History   Not on file  Tobacco Use   Smoking status: Never  Smokeless tobacco: Never  Vaping Use   Vaping Use: Never used  Substance and Sexual Activity   Alcohol use: No    Alcohol/week: 0.0 standard drinks of alcohol   Drug use: No   Sexual activity: Not Currently  Other Topics Concern   Not on file  Social History Narrative   He worked as a Production assistant, 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   Social Determinants of Health   Financial Resource Strain: Low Risk  (05/13/2020)   Overall Financial Resource Strain  (CARDIA)    Difficulty of Paying Living Expenses: Not hard at all  Food Insecurity: No Food Insecurity (05/13/2020)   Hunger Vital Sign    Worried About Running Out of Food in the Last Year: Never true    Ran Out of Food in the Last Year: Never true  Transportation Needs: No Transportation Needs (05/13/2020)   PRAPARE - Hydrologist (Medical): No    Lack of Transportation (Non-Medical): No  Physical Activity: Insufficiently Active (05/13/2020)   Exercise Vital Sign    Days of Exercise per Week: 3 days    Minutes of Exercise per Session: 20 min  Stress: No Stress Concern Present (05/13/2020)   [place]    Feeling of Stress : Not at all  Social Connections: Moderately Integrated (05/13/2020)   Social Connection and Isolation Panel [NHANES]    Frequency of Communication with Friends and Family: Twice a week    Frequency of Social Gatherings with Friends and Family: Twice a week    Attends Religious Services: 1 to 4 times per year    Active Member of [employer] or Organizations: No    Attends Archivist Meetings: Never    Marital Status: Married  Human resources officer Violence: Not At Risk (05/13/2020)   Humiliation, Afraid, Rape, and Kick questionnaire    Fear of Current or Ex-Partner: No    Emotionally Abused: No    Physically Abused: No    Sexually Abused: No    FAMILY HISTORY:  Family History  Problem Relation Age of Onset   Multiple myeloma Mother    Acute myelogenous leukemia Father    Leukemia Son        acute lymphocytic leukemia    CURRENT MEDICATIONS:  Current Outpatient Medications  Medication Sig Dispense Refill   ACCU-CHEK AVIVA PLUS test strip USE 1 STRIP TO CHECK GLUCOSE TWICE DAILY     Accu-Chek FastClix Lancets MISC Apply topically 2 (two) times daily.     acyclovir (ZOVIRAX) 400 MG tablet Take 1 tablet (400 mg total) by mouth 2 (two) times daily. 60 tablet 6    atenolol-chlorthalidone (TENORETIC) 50-25 MG per tablet Take 1 tablet by mouth daily. Takes 1/2 daily per Dr Luan Pulling     ciprofloxacin (CIPRO) 500 MG tablet Take 500 mg by mouth 2 (two) times daily.     cyanocobalamin (VITAMIN B12) 1000 MCG/ML injection Inject 1,000 mcg into the muscle every 30 (thirty) days.     Daratumumab-Hyaluronidase-fihj (DARZALEX FASPRO [place]) Inject into the skin.     dexamethasone (DECADRON) 4 MG tablet Take 5 tablets (20 mg total) by mouth once a week. 30 tablet 6   diclofenac sodium (VOLTAREN) 1 % GEL Apply 2 g topically daily as needed.     gabapentin (NEURONTIN) 300 MG capsule Take 300 mg by mouth 3 (three) times daily.     lenalidomide (REVLIMID) 10 MG capsule Take 1 capsule (  10 mg total) by mouth daily. 21 days on, 7 days off 21 capsule 0   loperamide (IMODIUM A-D) 2 MG tablet Take 2 mg by mouth 4 (four) times daily as needed for diarrhea or loose stools.     losartan (COZAAR) 50 MG tablet Take 50 mg by mouth daily.     metFORMIN (GLUCOPHAGE) 500 MG tablet Take 500 mg by mouth 2 (two) times daily with a meal.     pantoprazole (PROTONIX) 40 MG tablet Take 40 mg by mouth daily.     prochlorperazine (COMPAZINE) 10 MG tablet Take 1 tablet (10 mg total) by mouth every 6 (six) hours as needed for nausea or vomiting. 30 tablet 3   silver sulfADIAZINE (SILVADENE) 1 % cream Apply topically.     No current facility-administered medications for this visit.    ALLERGIES:  Allergies  Allergen Reactions   Penicillins Rash    Also swelling reaction    Gadavist [Gadobutrol] Nausea Only    Patient became nauseated immediately after contrast injection.  Was better after a few minutes.     PHYSICAL EXAM:  Performance status (ECOG): 1 - Symptomatic but completely ambulatory  Vitals:   05/09/22 0939  BP: 136/76  Pulse: 95  Resp: 19  Temp: (!) 97.1 F (36.2 C)  SpO2: 98%   Wt Readings from Last 3 Encounters:  05/09/22 80.4 kg (177 lb 4.8 oz)  05/01/22 83.5 kg (184 lb)   04/11/22 83 kg (183 lb)   Physical Exam Vitals and nursing note reviewed. Exam conducted with a chaperone present.  Constitutional:      Appearance: Normal appearance.  Cardiovascular:     Rate and Rhythm: Normal rate and regular rhythm.     Pulses: Normal pulses.     Heart sounds: Normal heart sounds.  Pulmonary:     Effort: Pulmonary effort is normal.     Breath sounds: Normal breath sounds.  Abdominal:     Palpations: Abdomen is soft. There is no hepatomegaly, splenomegaly or mass.     Tenderness: There is no abdominal tenderness.  Lymphadenopathy:     Cervical: No cervical adenopathy.     Right cervical: No superficial cervical adenopathy.    Left cervical: No superficial cervical adenopathy.  Neurological:     General: No focal deficit present.     Mental Status: He is alert and oriented to person, place, and time.  Psychiatric:        Mood and Affect: Mood normal.        Behavior: Behavior normal.     LABORATORY DATA:  I have reviewed the labs as listed.     Latest Ref Rng & Units 05/09/2022    9:09 AM 04/30/2022    1:07 PM 04/05/2022   10:23 AM  CBC  WBC 4.0 - 10.5 K/uL 7.1  7.3  6.4   Hemoglobin 13.0 - 17.0 g/dL 12.1  11.0  11.8   Hematocrit 39.0 - 52.0 % 35.5  32.5  34.4   Platelets 150 - 400 K/uL 224  273  231       Latest Ref Rng & Units 04/30/2022    1:07 PM 04/05/2022   10:23 AM 01/01/2022    1:20 PM  CMP  Glucose 70 - 99 mg/dL 141  133  133   BUN 8 - 23 mg/dL '19  18  26   '$ Creatinine 0.61 - 1.24 mg/dL 1.39  1.29  1.49   Sodium 135 - 145 mmol/L 131  132  134   Potassium 3.5 - 5.1 mmol/L 3.7  3.2  3.2   Chloride 98 - 111 mmol/L 103  100  102   CO2 22 - 32 mmol/L '22  25  24   '$ Calcium 8.9 - 10.3 mg/dL 8.1  8.4  8.6   Total Protein 6.5 - 8.1 g/dL 11.2  11.1  10.7   Total Bilirubin 0.3 - 1.2 mg/dL 0.7  1.1  1.1   Alkaline Phos 38 - 126 U/L 62  68  74   AST 15 - 41 U/L '21  15  15   '$ ALT 0 - 44 U/L '10  8  11       '$ Component Value Date/Time   RBC 3.40 (L)  05/09/2022 0909   MCV 104.4 (H) 05/09/2022 0909   MCH 35.6 (H) 05/09/2022 0909   MCHC 34.1 05/09/2022 0909   RDW 16.3 (H) 05/09/2022 0909   LYMPHSABS 0.6 (L) 05/09/2022 0909   MONOABS 0.4 05/09/2022 0909   EOSABS 0.9 (H) 05/09/2022 0909   BASOSABS 0.0 05/09/2022 0909    DIAGNOSTIC IMAGING:  I have independently reviewed the scans and discussed with the patient. DG Chest 2 View  Result Date: 04/19/2022 CLINICAL DATA:  Cough for 5 days. EXAM: CHEST - 2 VIEW COMPARISON:  None Available. FINDINGS: The heart size and mediastinal contours are within normal limits. Both lungs are clear. Degenerative joint changes of the spine and scoliosis spine are noted. IMPRESSION: No active cardiopulmonary disease. Electronically Signed   By: Abelardo Diesel M.D.   On: 04/19/2022 15:30     ASSESSMENT:  IgG kappa smoldering multiple myeloma: - Skeletal survey in February 2022 was negative. - Bone marrow biopsy on 06/06/2020 with 20% plasma cells.  Myeloma FISH panel with monosomy 13/deletion 13.  Chromosome analysis 46, XY (20). - No "crab" features. - PET scan from 06/29/2021: No evidence of myeloma or plasmacytoma. - Skeletal survey on 06/13/2021: No lytic lesions. - MRI of the cervical/thoracic/lumbar spine negative for bone lesions with some degenerative changes. - BMBX (02/01/2022): Hypercellular marrow with plasma cells representing 35% of all cells.  They display kappa light chain restriction.  Cytogenetics-46, XY (20). - Myeloma FISH panel: Monosomy 13. - PET scan (04/05/2022): No suspicious bony lesions or soft tissue masses. - DRD regimen cycle 1 started on 05/01/2022 due to impending myeloma.   2.  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 - No major chemical exposures.  He had personal history of melanoma of the upper lip. - Father had acute myeloid leukemia.  Mother had multiple myeloma.  Son died of acute lymphocytic leukemia   PLAN:  1.   Stage II IgG kappa multiple myeloma, standard risk: - He has started cycle 1 on 05/01/2022. - He tolerated Darzalex very well.  He is taking Revlimid 10 mg 3 weeks on/1 week off. - He reported occasional dizziness since the start of therapy.  This happens mainly when he suddenly gets up from the bed at nighttime.  I have suggested that he sit by the side of the bed for few minutes before standing up. - Reviewed CBC from today which showed improvement in hemoglobin to 12.1.  White count and platelet count is normal. - Continue weekly Darzalex.  Continue dexamethasone 20 mg weekly and Revlimid as prescribed. - Recommend follow-up on 05/29/2022 to start cycle 2 with myeloma labs on same day.   2.  Peripheral neuropathy: - Continue gabapentin 300 mg  4 times daily.  Fairly well-controlled with no worsening.  3.  Prophylaxis: - Continue acyclovir 400 mg twice daily and aspirin 81 mg daily.  Orders placed this encounter:  Orders Placed This Encounter  Procedures   CMP ([facility] only)   Protein electrophoresis, serum   Kappa/lambda light chains   Magnesium   CMP ([facility] only)   Protein electrophoresis, serum   Kappa/lambda light chains   Magnesium   CMP ([facility] only)   Protein electrophoresis, serum   Kappa/lambda light chains   Magnesium   CMP ([facility] only)   Protein electrophoresis, serum   Kappa/lambda light chains   Magnesium   CBC with Differential   CBC with Differential     I,[name],acting as a scribe for Derek Jack, MD.,have documented all relevant documentation on the behalf of Derek Jack, MD,as directed by  Derek Jack, MD while in the presence of Derek Jack, MD.  I, Derek Jack MD, have reviewed the above documentation for accuracy and completeness, and I agree with the above.   Derek Jack, MD [facility] [phone]

## 2022-05-09 NOTE — Telephone Encounter (Signed)
Chart reviewed. Revlimid refilled per last office note with Dr. Katragadda.  

## 2022-05-09 NOTE — Patient Instructions (Addendum)
[place] at [facility] Discharge Instructions   You were seen and examined today by Dr. Delton Coombes.  He reviewed the results of your lab work which are normal/stable.   We will proceed with your treatment today.  Continue Revlimid as prescibed.   Return as scheduled.    Thank you for choosing [place] at [facility] to provide your oncology and hematology care.  To afford each patient quality time with our provider, please arrive at least 15 minutes before your scheduled appointment time.   If you have a lab appointment with the [facility] please come in thru the Main Entrance and check in at the main information desk.  You need to re-schedule your appointment should you arrive 10 or more minutes late.  We strive to give you quality time with our providers, and arriving late affects you and other patients whose appointments are after yours.  Also, if you no show three or more times for appointments you may be dismissed from the clinic at the providers discretion.     Again, thank you for choosing [facility].  Our hope is that these requests will decrease the amount of time that you wait before being seen by our physicians.       _____________________________________________________________  Should you have questions after your visit to [facility], please contact our office at [phone] and follow the prompts.  Our office hours are 8:00 a.m. and 4:30 p.m. Monday - Friday.  Please note that voicemails left after 4:00 p.m. may not be returned until the following business day.  We are closed weekends and major holidays.  You do have access to a nurse 24-7, just call the main number to the clinic [phone] and do not press any options, hold on the line and a nurse will answer the phone.    For prescription refill requests, have your pharmacy contact our office and allow 72 hours.    Due to Covid, you will  need to wear a mask upon entering the hospital. If you do not have a mask, a mask will be given to you at the Main Entrance upon arrival. For doctor visits, patients may have 1 support person age 15 or older with them. For treatment visits, patients can not have anyone with them due to social distancing guidelines and our immunocompromised population.

## 2022-05-09 NOTE — Progress Notes (Signed)
Patient has been examined by Dr. Katragadda, and vital signs and labs have been reviewed. ANC, Creatinine, LFTs, hemoglobin, and platelets are within treatment parameters per M.D. - pt may proceed with treatment.  Primary RN and pharmacy notified.  

## 2022-05-09 NOTE — Patient Instructions (Signed)
[facility]  Discharge Instructions: Thank you for choosing [facility] to provide your oncology and hematology care.  If you have a lab appointment with the [facility], please come in thru the Main Entrance and check in at the main information desk.  Wear comfortable clothing and clothing appropriate for easy access to any Portacath or PICC line.   We strive to give you quality time with your provider. You may need to reschedule your appointment if you arrive late (15 or more minutes).  Arriving late affects you and other patients whose appointments are after yours.  Also, if you miss three or more appointments without notifying the office, you may be dismissed from the clinic at the provider's discretion.      For prescription refill requests, have your pharmacy contact our office and allow 72 hours for refills to be completed.    Today you received the following chemotherapy and/or immunotherapy agents Dara [place]   To help prevent nausea and vomiting after your treatment, we encourage you to take your nausea medication as directed.   Daratumumab Injection What is this medication? DARATUMUMAB (dar a toom ue mab) treats multiple myeloma, a type of bone marrow cancer. It works by helping your immune system slow or stop the spread of cancer cells. It is a monoclonal antibody. This medicine may be used for other purposes; ask your health care provider or pharmacist if you have questions. COMMON BRAND NAME(S): DARZALEX What should I tell my care team before I take this medication? They need to know if you have any of these conditions: Hereditary fructose intolerance Infection, such as chickenpox, herpes, hepatitis B virus Lung or breathing disease, such as asthma, COPD An unusual or allergic reaction to daratumumab, sorbitol, other medications, foods, dyes, or preservatives Pregnant or trying to get pregnant Breast-feeding How should I use this  medication? This medication is injected into a vein. It is given by your care team in a hospital or clinic setting. Talk to your care team about the use of this medication in children. Special care may be needed. Overdosage: If you think you have taken too much of this medicine contact a poison control center or emergency room at once. NOTE: This medicine is only for you. Do not share this medicine with others. What if I miss a dose? Keep appointments for follow-up doses. It is important not to miss your dose. Call your care team if you are unable to keep an appointment. What may interact with this medication? Interactions have not been studied. This list may not describe all possible interactions. Give your health care provider a list of all the medicines, herbs, non-prescription drugs, or dietary supplements you use. Also tell them if you smoke, drink alcohol, or use illegal drugs. Some items may interact with your medicine. What should I watch for while using this medication? Your condition will be monitored carefully while you are receiving this medication. This medication can cause serious allergic reactions. To reduce your risk, your care team may give you other medication to take before receiving this one. Be sure to follow the directions from your care team. This medication can affect the results of blood tests to match your blood type. These changes can last for up to 6 months after the final dose. Your care team will do blood tests to match your blood type before you start treatment. Tell all of your care team that you are being treated with this medication before  receiving a blood transfusion. This medication can affect the results of some tests used to determine treatment response; extra tests may be needed to evaluate response. Talk to your care team if you wish to become pregnant or think you are pregnant. This medication can cause serious birth defects if taken during pregnancy and for  3 months after the last dose. A reliable form of contraception is recommended while taking this medication and for 3 months after the last dose. Talk to your care team about effective forms of contraception. Do not breast-feed while taking this medication. What side effects may I notice from receiving this medication? Side effects that you should report to your care team as soon as possible: Allergic reactions--skin rash, itching, hives, swelling of the face, lips, tongue, or throat Infection--fever, chills, cough, sore throat, wounds that don't heal, pain or trouble when passing urine, general feeling of discomfort or being unwell Infusion reactions--chest pain, shortness of breath or trouble breathing, feeling faint or lightheaded Unusual bruising or bleeding Side effects that usually do not require medical attention (report to your care team if they continue or are bothersome): Constipation Diarrhea Fatigue Nausea Pain, tingling, or numbness in the hands or feet Swelling of the ankles, hands, or feet This list may not describe all possible side effects. Call your doctor for medical advice about side effects. You may report side effects to FDA at 1-800-FDA-1088. Where should I keep my medication? This medication is given in a hospital or clinic. It will not be stored at home. NOTE: This sheet is a summary. It may not cover all possible information. If you have questions about this medicine, talk to your doctor, pharmacist, or health care provider.  2023 Elsevier/Gold Standard (2021-07-12 00:00:00)  BELOW ARE SYMPTOMS THAT SHOULD BE REPORTED IMMEDIATELY: *FEVER GREATER THAN 100.4 F (38 C) OR HIGHER *CHILLS OR SWEATING *NAUSEA AND VOMITING THAT IS NOT CONTROLLED WITH YOUR NAUSEA MEDICATION *UNUSUAL SHORTNESS OF BREATH *UNUSUAL BRUISING OR BLEEDING *URINARY PROBLEMS (pain or burning when urinating, or frequent urination) *BOWEL PROBLEMS (unusual diarrhea, constipation, pain near the  anus) TENDERNESS IN MOUTH AND THROAT WITH OR WITHOUT PRESENCE OF ULCERS (sore throat, sores in mouth, or a toothache) UNUSUAL RASH, SWELLING OR PAIN  UNUSUAL VAGINAL DISCHARGE OR ITCHING   Items with * indicate a potential emergency and should be followed up as soon as possible or go to the Emergency Department if any problems should occur.  Please show the CHEMOTHERAPY ALERT CARD or IMMUNOTHERAPY ALERT CARD at check-in to the Emergency Department and triage nurse.  Should you have questions after your visit or need to cancel or reschedule your appointment, please contact [facility] [phone]  and follow the prompts.  Office hours are 8:00 a.m. to 4:30 p.m. Monday - Friday. Please note that voicemails left after 4:00 p.m. may not be returned until the following business day.  We are closed weekends and major holidays. You have access to a nurse at all times for urgent questions. Please call the main number to the clinic [phone] and follow the prompts.  For any non-urgent questions, you may also contact your provider using MyChart. We now offer e-Visits for anyone 33 and older to request care online for non-urgent symptoms. For details visit [email or web address].   Also download the MyChart app! Go to the app store, search "MyChart", open the app, select , and log in with your MyChart username and password.

## 2022-05-09 NOTE — Progress Notes (Signed)
Pt presents today for [name] [name] per provider's order. Vital signs and labs WNL for treatment. Okay to proceed with tx today per Dr.K.  [name]  given today per MD orders. Tolerated infusion without adverse affects. Vital signs stable. No complaints at this time. Discharged from clinic ambulatory in stable condition. Alert and oriented x 3. F/U with [facility] as scheduled.

## 2022-05-14 ENCOUNTER — Other Ambulatory Visit: Payer: Self-pay

## 2022-05-14 DIAGNOSIS — I10 Essential (primary) hypertension: Secondary | ICD-10-CM | POA: Diagnosis not present

## 2022-05-14 DIAGNOSIS — C9000 Multiple myeloma not having achieved remission: Secondary | ICD-10-CM

## 2022-05-14 DIAGNOSIS — E782 Mixed hyperlipidemia: Secondary | ICD-10-CM | POA: Diagnosis not present

## 2022-05-14 DIAGNOSIS — E1122 Type 2 diabetes mellitus with diabetic chronic kidney disease: Secondary | ICD-10-CM | POA: Diagnosis not present

## 2022-05-15 ENCOUNTER — Inpatient Hospital Stay: Payer: Medicare Other

## 2022-05-15 ENCOUNTER — Inpatient Hospital Stay: Payer: Medicare Other | Admitting: Hematology

## 2022-05-15 VITALS — BP 111/65 | HR 88 | Temp 97.0°F | Resp 18 | Wt 176.8 lb

## 2022-05-15 DIAGNOSIS — Z5112 Encounter for antineoplastic immunotherapy: Secondary | ICD-10-CM | POA: Diagnosis not present

## 2022-05-15 DIAGNOSIS — C9000 Multiple myeloma not having achieved remission: Secondary | ICD-10-CM | POA: Diagnosis not present

## 2022-05-15 DIAGNOSIS — L97511 Non-pressure chronic ulcer of other part of right foot limited to breakdown of skin: Secondary | ICD-10-CM | POA: Diagnosis not present

## 2022-05-15 LAB — CMP (CANCER CENTER ONLY)
ALT: 11 U/L (ref 0–44)
AST: 15 U/L (ref 15–41)
Albumin: 2.9 g/dL — ABNORMAL LOW (ref 3.5–5.0)
Alkaline Phosphatase: 80 U/L (ref 38–126)
Anion gap: 9 (ref 5–15)
BUN: 31 mg/dL — ABNORMAL HIGH (ref 8–23)
CO2: 26 mmol/L (ref 22–32)
Calcium: 8.1 mg/dL — ABNORMAL LOW (ref 8.9–10.3)
Chloride: 97 mmol/L — ABNORMAL LOW (ref 98–111)
Creatinine: 1.29 mg/dL — ABNORMAL HIGH (ref 0.61–1.24)
GFR, Estimated: 57 mL/min — ABNORMAL LOW (ref >60)
Glucose, Bld: 261 mg/dL — ABNORMAL HIGH (ref 70–99)
Potassium: 4.1 mmol/L (ref 3.5–5.1)
Sodium: 132 mmol/L — ABNORMAL LOW (ref 135–145)
Total Bilirubin: 1.3 mg/dL — ABNORMAL HIGH (ref 0.3–1.2)
Total Protein: 8.3 g/dL — ABNORMAL HIGH (ref 6.5–8.1)

## 2022-05-15 LAB — CBC WITH DIFFERENTIAL/PLATELET
Abs Immature Granulocytes: 0.03 10*3/uL (ref 0.00–0.07)
Basophils Absolute: 0 10*3/uL (ref 0.0–0.1)
Basophils Relative: 1 %
Eosinophils Absolute: 1.1 10*3/uL — ABNORMAL HIGH (ref 0.0–0.5)
Eosinophils Relative: 16 %
HCT: 38.4 % — ABNORMAL LOW (ref 39.0–52.0)
Hemoglobin: 13.1 g/dL (ref 13.0–17.0)
Immature Granulocytes: 1 %
Lymphocytes Relative: 9 %
Lymphs Abs: 0.6 10*3/uL — ABNORMAL LOW (ref 0.7–4.0)
MCH: 35.1 pg — ABNORMAL HIGH (ref 26.0–34.0)
MCHC: 34.1 g/dL (ref 30.0–36.0)
MCV: 102.9 fL — ABNORMAL HIGH (ref 80.0–100.0)
Monocytes Absolute: 0.5 10*3/uL (ref 0.1–1.0)
Monocytes Relative: 7 %
Neutro Abs: 4.4 10*3/uL (ref 1.7–7.7)
Neutrophils Relative %: 66 %
Platelets: 247 10*3/uL (ref 150–400)
RBC: 3.73 MIL/uL — ABNORMAL LOW (ref 4.22–5.81)
RDW: 15.4 % (ref 11.5–15.5)
WBC: 6.5 10*3/uL (ref 4.0–10.5)
nRBC: 0 % (ref 0.0–0.2)

## 2022-05-15 LAB — MAGNESIUM: Magnesium: 1.7 mg/dL (ref 1.7–2.4)

## 2022-05-15 MED ORDER — ACETAMINOPHEN 325 MG PO TABS
650.0000 mg | ORAL_TABLET | Freq: Once | ORAL | Status: AC
  Administered 2022-05-15: 650 mg via ORAL
  Filled 2022-05-15: qty 2

## 2022-05-15 MED ORDER — CETIRIZINE HCL 10 MG PO TABS
10.0000 mg | ORAL_TABLET | Freq: Once | ORAL | Status: AC
  Administered 2022-05-15: 10 mg via ORAL
  Filled 2022-05-15: qty 1

## 2022-05-15 MED ORDER — MONTELUKAST SODIUM 10 MG PO TABS
10.0000 mg | ORAL_TABLET | Freq: Once | ORAL | Status: AC
  Administered 2022-05-15: 10 mg via ORAL
  Filled 2022-05-15: qty 1

## 2022-05-15 MED ORDER — DARATUMUMAB-HYALURONIDASE-FIHJ 1800-30000 MG-UT/15ML ~~LOC~~ SOLN
1800.0000 mg | Freq: Once | SUBCUTANEOUS | Status: AC
  Administered 2022-05-15: 1800 mg via SUBCUTANEOUS
  Filled 2022-05-15: qty 15

## 2022-05-15 NOTE — Patient Instructions (Signed)
[facility]  Discharge Instructions: Thank you for choosing [facility] to provide your oncology and hematology care.  If you have a lab appointment with the [facility], please come in thru the Main Entrance and check in at the main information desk.  Wear comfortable clothing and clothing appropriate for easy access to any Portacath or PICC line.   We strive to give you quality time with your provider. You may need to reschedule your appointment if you arrive late (15 or more minutes).  Arriving late affects you and other patients whose appointments are after yours.  Also, if you miss three or more appointments without notifying the office, you may be dismissed from the clinic at the provider's discretion.      For prescription refill requests, have your pharmacy contact our office and allow 72 hours for refills to be completed.    Today you received the following chemotherapy and/or immunotherapy agents Daratumumab Injection What is this medication? DARATUMUMAB (dar a toom ue mab) treats multiple myeloma, a type of bone marrow cancer. It works by helping your immune system slow or stop the spread of cancer cells. It is a monoclonal antibody. This medicine may be used for other purposes; ask your health care provider or pharmacist if you have questions. COMMON BRAND NAME(S): DARZALEX What should I tell my care team before I take this medication? They need to know if you have any of these conditions: Hereditary fructose intolerance Infection, such as chickenpox, herpes, hepatitis B virus Lung or breathing disease, such as asthma, COPD An unusual or allergic reaction to daratumumab, sorbitol, other medications, foods, dyes, or preservatives Pregnant or trying to get pregnant Breast-feeding How should I use this medication? This medication is injected into a vein. It is given by your care team in a hospital or clinic setting. Talk to your care team about  the use of this medication in children. Special care may be needed. Overdosage: If you think you have taken too much of this medicine contact a poison control center or emergency room at once. NOTE: This medicine is only for you. Do not share this medicine with others. What if I miss a dose? Keep appointments for follow-up doses. It is important not to miss your dose. Call your care team if you are unable to keep an appointment. What may interact with this medication? Interactions have not been studied. This list may not describe all possible interactions. Give your health care provider a list of all the medicines, herbs, non-prescription drugs, or dietary supplements you use. Also tell them if you smoke, drink alcohol, or use illegal drugs. Some items may interact with your medicine. What should I watch for while using this medication? Your condition will be monitored carefully while you are receiving this medication. This medication can cause serious allergic reactions. To reduce your risk, your care team may give you other medication to take before receiving this one. Be sure to follow the directions from your care team. This medication can affect the results of blood tests to match your blood type. These changes can last for up to 6 months after the final dose. Your care team will do blood tests to match your blood type before you start treatment. Tell all of your care team that you are being treated with this medication before receiving a blood transfusion. This medication can affect the results of some tests used to determine treatment response; extra tests may be needed to evaluate  response. Talk to your care team if you wish to become pregnant or think you are pregnant. This medication can cause serious birth defects if taken during pregnancy and for 3 months after the last dose. A reliable form of contraception is recommended while taking this medication and for 3 months after the last dose.  Talk to your care team about effective forms of contraception. Do not breast-feed while taking this medication. What side effects may I notice from receiving this medication? Side effects that you should report to your care team as soon as possible: Allergic reactions--skin rash, itching, hives, swelling of the face, lips, tongue, or throat Infection--fever, chills, cough, sore throat, wounds that don't heal, pain or trouble when passing urine, general feeling of discomfort or being unwell Infusion reactions--chest pain, shortness of breath or trouble breathing, feeling faint or lightheaded Unusual bruising or bleeding Side effects that usually do not require medical attention (report to your care team if they continue or are bothersome): Constipation Diarrhea Fatigue Nausea Pain, tingling, or numbness in the hands or feet Swelling of the ankles, hands, or feet This list may not describe all possible side effects. Call your doctor for medical advice about side effects. You may report side effects to FDA at 1-800-FDA-1088. Where should I keep my medication? This medication is given in a hospital or clinic. It will not be stored at home. NOTE: This sheet is a summary. It may not cover all possible information. If you have questions about this medicine, talk to your doctor, pharmacist, or health care provider.  2023 Elsevier/Gold Standard (2021-07-12 00:00:00)       To help prevent nausea and vomiting after your treatment, we encourage you to take your nausea medication as directed.  BELOW ARE SYMPTOMS THAT SHOULD BE REPORTED IMMEDIATELY: *FEVER GREATER THAN 100.4 F (38 C) OR HIGHER *CHILLS OR SWEATING *NAUSEA AND VOMITING THAT IS NOT CONTROLLED WITH YOUR NAUSEA MEDICATION *UNUSUAL SHORTNESS OF BREATH *UNUSUAL BRUISING OR BLEEDING *URINARY PROBLEMS (pain or burning when urinating, or frequent urination) *BOWEL PROBLEMS (unusual diarrhea, constipation, pain near the anus) TENDERNESS IN  MOUTH AND THROAT WITH OR WITHOUT PRESENCE OF ULCERS (sore throat, sores in mouth, or a toothache) UNUSUAL RASH, SWELLING OR PAIN  UNUSUAL VAGINAL DISCHARGE OR ITCHING   Items with * indicate a potential emergency and should be followed up as soon as possible or go to the Emergency Department if any problems should occur.  Please show the CHEMOTHERAPY ALERT CARD or IMMUNOTHERAPY ALERT CARD at check-in to the Emergency Department and triage nurse.  Should you have questions after your visit or need to cancel or reschedule your appointment, please contact [facility] [phone]  and follow the prompts.  Office hours are 8:00 a.m. to 4:30 p.m. Monday - Friday. Please note that voicemails left after 4:00 p.m. may not be returned until the following business day.  We are closed weekends and major holidays. You have access to a nurse at all times for urgent questions. Please call the main number to the clinic [phone] and follow the prompts.  For any non-urgent questions, you may also contact your provider using MyChart. We now offer e-Visits for anyone 22 and older to request care online for non-urgent symptoms. For details visit [email or web address].   Also download the MyChart app! Go to the app store, search "MyChart", open the app, select [facility], and log in with your MyChart username and password.

## 2022-05-15 NOTE — Progress Notes (Signed)
Patient presents today for Darzalex Faspro injection. Vital signs and labs within parameters for treatment. Patient states he is taking Revlimid as prescribed and took 20 mg of Decadron by mouth this morning prior to arrival. Patient has complaints of fatigue not relieved by rest. Vital signs within parameters for treatment. Patient teaching performed related to increasing oral /hydration. Understanding verbalized.   Treatment given today per MD orders. Tolerated infusion without adverse affects. Vital signs stable. No complaints at this time. Discharged from clinic ambulatory in stable condition. Alert and oriented x 3. F/U with [facility] as scheduled.

## 2022-05-16 ENCOUNTER — Other Ambulatory Visit: Payer: Self-pay

## 2022-05-17 ENCOUNTER — Inpatient Hospital Stay: Payer: Medicare Other | Admitting: Licensed Clinical Social Worker

## 2022-05-17 ENCOUNTER — Other Ambulatory Visit: Payer: Medicare Other | Admitting: Licensed Clinical Social Worker

## 2022-05-17 DIAGNOSIS — C9000 Multiple myeloma not having achieved remission: Secondary | ICD-10-CM

## 2022-05-18 ENCOUNTER — Encounter: Payer: Self-pay | Admitting: Hematology

## 2022-05-18 NOTE — Progress Notes (Signed)
[facility] Work  Initial Assessment   [name] [name] is a 77 y.o. year old male accompanied by patient and spouse. Pt is a veteran, but not connected to [place] services.  Pt ambulates w/ the assistance of a cane as he has significant neuropathy making walking challenging.  Clinical Social Work was referred by medical provider for assessment of psychosocial needs.   SDOH (Social Determinants of Health) assessments performed: Yes   SDOH Screenings   Food Insecurity: No Food Insecurity (05/13/2020)  Housing: Low Risk  (05/13/2020)  Transportation Needs: No Transportation Needs (05/13/2020)  Alcohol Screen: Low Risk  (05/13/2020)  Depression (PHQ2-9): Low Risk  (05/13/2020)  Financial Resource Strain: Low Risk  (05/13/2020)  Physical Activity: Insufficiently Active (05/13/2020)  Social Connections: Moderately Integrated (05/13/2020)  Stress: No Stress Concern Present (05/13/2020)  Tobacco Use: Low Risk  (05/15/2022)     Distress Screen completed: No     No data to display            Family/Social Information:  Housing Arrangement: patient lives with his spouse, [name] [name] members/support persons in your life? The couple has a son residing 75 miles away as well as grandchildren.  Pt is connected to his church community who also provide support if needed.  The couple lost a son to leukemia 30 years ago.  Pt's father also passed from leukemia which has triggered some emotional response to pt's diagnosis. 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  Income source: Paediatric nurse concerns:  The pt lives on a fixed income which has been enough to cover the bills, but there is little flexibility for unexpected expenses and the cost of treatment has been overwhelming for pt.  Financial resources is working with pt to secure funding for treatment not covered by insurance. Type of concern:  Medical bills Food access concerns: no Religious or spiritual practice: Yes-  Services Currently in place:  none  Coping/ Adjustment to diagnosis: Patient understands treatment plan and what happens next? yes Concerns about diagnosis and/or treatment: Overwhelmed by information, How will I care for myself, and Quality of life Patient reported stressors: Anxiety/ nervousness, Adjusting to my illness, and Facing my mortality Hopes and/or priorities: Pt's priority is to start treatment w/ the hope of positive results. Patient enjoys time with family/ friends Current coping skills/ strengths: Ability for insight , Capable of independent living , Armed forces logistics/support/administrative officer , Motivation for treatment/growth , and Supportive family/friends     SUMMARY: Current SDOH Barriers:  Financial constraints related to fixed income.  Clinical Social Work Clinical Goal(s):  Explore community resource options for unmet needs related to:  Financial Strain   Interventions: Discussed common feeling and emotions when being diagnosed with cancer, and the importance of support during treatment Informed patient of the support team roles and support services at [facility] Provided [facility] contact information and encouraged patient to call with any questions or concerns Referred patient to [facility], informed about the [employer], discussed Duanne [place] - pt declined referral at this time, but have contact information to connect later if appropriate.   Discussed how to apply for the [place] should it become necessary pending what insurance covers.   Follow Up Plan: Patient will contact CSW with any support or resource needs Patient verbalizes understanding of plan: Yes    Henriette Combs, LCSW

## 2022-05-21 ENCOUNTER — Encounter: Payer: Self-pay | Admitting: Internal Medicine

## 2022-05-21 ENCOUNTER — Other Ambulatory Visit: Payer: Self-pay

## 2022-05-22 ENCOUNTER — Other Ambulatory Visit: Payer: Self-pay

## 2022-05-22 DIAGNOSIS — C9000 Multiple myeloma not having achieved remission: Secondary | ICD-10-CM

## 2022-05-23 ENCOUNTER — Inpatient Hospital Stay: Payer: Medicare Other | Admitting: Hematology

## 2022-05-23 ENCOUNTER — Other Ambulatory Visit: Payer: Self-pay

## 2022-05-23 ENCOUNTER — Other Ambulatory Visit: Payer: Self-pay | Admitting: *Deleted

## 2022-05-23 ENCOUNTER — Inpatient Hospital Stay: Payer: Medicare Other

## 2022-05-23 VITALS — BP 124/57 | HR 72 | Temp 96.9°F | Resp 16 | Wt 174.9 lb

## 2022-05-23 DIAGNOSIS — C9000 Multiple myeloma not having achieved remission: Secondary | ICD-10-CM | POA: Diagnosis not present

## 2022-05-23 DIAGNOSIS — Z5112 Encounter for antineoplastic immunotherapy: Secondary | ICD-10-CM | POA: Diagnosis not present

## 2022-05-23 LAB — CBC WITH DIFFERENTIAL/PLATELET
Abs Immature Granulocytes: 0.01 10*3/uL (ref 0.00–0.07)
Basophils Absolute: 0 10*3/uL (ref 0.0–0.1)
Basophils Relative: 1 %
Eosinophils Absolute: 1.3 10*3/uL — ABNORMAL HIGH (ref 0.0–0.5)
Eosinophils Relative: 25 %
HCT: 36.9 % — ABNORMAL LOW (ref 39.0–52.0)
Hemoglobin: 12.6 g/dL — ABNORMAL LOW (ref 13.0–17.0)
Immature Granulocytes: 0 %
Lymphocytes Relative: 18 %
Lymphs Abs: 0.9 10*3/uL (ref 0.7–4.0)
MCH: 34.3 pg — ABNORMAL HIGH (ref 26.0–34.0)
MCHC: 34.1 g/dL (ref 30.0–36.0)
MCV: 100.5 fL — ABNORMAL HIGH (ref 80.0–100.0)
Monocytes Absolute: 0.7 10*3/uL (ref 0.1–1.0)
Monocytes Relative: 15 %
Neutro Abs: 2 10*3/uL (ref 1.7–7.7)
Neutrophils Relative %: 41 %
Platelets: 255 10*3/uL (ref 150–400)
RBC: 3.67 MIL/uL — ABNORMAL LOW (ref 4.22–5.81)
RDW: 14.2 % (ref 11.5–15.5)
WBC: 5 10*3/uL (ref 4.0–10.5)
nRBC: 0 % (ref 0.0–0.2)

## 2022-05-23 LAB — COMPREHENSIVE METABOLIC PANEL
ALT: 15 U/L (ref 0–44)
AST: 22 U/L (ref 15–41)
Albumin: 3 g/dL — ABNORMAL LOW (ref 3.5–5.0)
Alkaline Phosphatase: 88 U/L (ref 38–126)
Anion gap: 11 (ref 5–15)
BUN: 26 mg/dL — ABNORMAL HIGH (ref 8–23)
CO2: 25 mmol/L (ref 22–32)
Calcium: 7.9 mg/dL — ABNORMAL LOW (ref 8.9–10.3)
Chloride: 98 mmol/L (ref 98–111)
Creatinine, Ser: 1.33 mg/dL — ABNORMAL HIGH (ref 0.61–1.24)
GFR, Estimated: 55 mL/min — ABNORMAL LOW (ref >60)
Glucose, Bld: 198 mg/dL — ABNORMAL HIGH (ref 70–99)
Potassium: 3.8 mmol/L (ref 3.5–5.1)
Sodium: 134 mmol/L — ABNORMAL LOW (ref 135–145)
Total Bilirubin: 1.1 mg/dL (ref 0.3–1.2)
Total Protein: 7.2 g/dL (ref 6.5–8.1)

## 2022-05-23 LAB — MAGNESIUM: Magnesium: 1.6 mg/dL — ABNORMAL LOW (ref 1.7–2.4)

## 2022-05-23 MED ORDER — CETIRIZINE HCL 10 MG PO TABS
10.0000 mg | ORAL_TABLET | Freq: Every day | ORAL | Status: DC
  Administered 2022-05-23: 10 mg via ORAL
  Filled 2022-05-23: qty 1

## 2022-05-23 MED ORDER — MAGNESIUM OXIDE 400 MG PO TABS
400.0000 mg | ORAL_TABLET | Freq: Once | ORAL | Status: AC
  Administered 2022-05-23: 400 mg via ORAL
  Filled 2022-05-23: qty 1

## 2022-05-23 MED ORDER — DARATUMUMAB-HYALURONIDASE-FIHJ 1800-30000 MG-UT/15ML ~~LOC~~ SOLN
1800.0000 mg | Freq: Once | SUBCUTANEOUS | Status: AC
  Administered 2022-05-23: 1800 mg via SUBCUTANEOUS
  Filled 2022-05-23: qty 15

## 2022-05-23 MED ORDER — ACETAMINOPHEN 325 MG PO TABS
650.0000 mg | ORAL_TABLET | Freq: Once | ORAL | Status: AC
  Administered 2022-05-23: 650 mg via ORAL
  Filled 2022-05-23: qty 2

## 2022-05-23 MED ORDER — PREDNISONE 20 MG PO TABS
20.0000 mg | ORAL_TABLET | Freq: Once | ORAL | Status: AC
  Administered 2022-05-23: 20 mg via ORAL
  Filled 2022-05-23: qty 1

## 2022-05-23 NOTE — Progress Notes (Signed)
Patient presented to clinic today for treatment. Patient stated he forgot to take his decadron this morning. His magnesium today is 1.6.   Labs reviewed today, they meet parameters for treatment today. Will proceed as planned. MD notified about Mag level, will give PO dose of magnesium per MD.   Treatment given per orders. Patient tolerated it well without problems. Vitals stable and discharged home from clinic ambulatory. Follow up as scheduled.

## 2022-05-23 NOTE — Patient Instructions (Signed)
[facility]  Discharge Instructions: Thank you for choosing [facility] to provide your oncology and hematology care.  If you have a lab appointment with the [facility], please come in thru the Main Entrance and check in at the main information desk.  Wear comfortable clothing and clothing appropriate for easy access to any Portacath or PICC line.   We strive to give you quality time with your provider. You may need to reschedule your appointment if you arrive late (15 or more minutes).  Arriving late affects you and other patients whose appointments are after yours.  Also, if you miss three or more appointments without notifying the office, you may be dismissed from the clinic at the provider's discretion.      For prescription refill requests, have your pharmacy contact our office and allow 72 hours for refills to be completed.    Today you received the following chemotherapy and/or immunotherapy agents SQ darzalex    To help prevent nausea and vomiting after your treatment, we encourage you to take your nausea medication as directed.  BELOW ARE SYMPTOMS THAT SHOULD BE REPORTED IMMEDIATELY: *FEVER GREATER THAN 100.4 F (38 C) OR HIGHER *CHILLS OR SWEATING *NAUSEA AND VOMITING THAT IS NOT CONTROLLED WITH YOUR NAUSEA MEDICATION *UNUSUAL SHORTNESS OF BREATH *UNUSUAL BRUISING OR BLEEDING *URINARY PROBLEMS (pain or burning when urinating, or frequent urination) *BOWEL PROBLEMS (unusual diarrhea, constipation, pain near the anus) TENDERNESS IN MOUTH AND THROAT WITH OR WITHOUT PRESENCE OF ULCERS (sore throat, sores in mouth, or a toothache) UNUSUAL RASH, SWELLING OR PAIN  UNUSUAL VAGINAL DISCHARGE OR ITCHING   Items with * indicate a potential emergency and should be followed up as soon as possible or go to the Emergency Department if any problems should occur.  Please show the CHEMOTHERAPY ALERT CARD or IMMUNOTHERAPY ALERT CARD at check-in to the  Emergency Department and triage nurse.  Should you have questions after your visit or need to cancel or reschedule your appointment, please contact [facility] [phone]  and follow the prompts.  Office hours are 8:00 a.m. to 4:30 p.m. Monday - Friday. Please note that voicemails left after 4:00 p.m. may not be returned until the following business day.  We are closed weekends and major holidays. You have access to a nurse at all times for urgent questions. Please call the main number to the clinic [phone] and follow the prompts.  For any non-urgent questions, you may also contact your provider using MyChart. We now offer e-Visits for anyone 70 and older to request care online for non-urgent symptoms. For details visit [email or web address].   Also download the MyChart app! Go to the app store, search "MyChart", open the app, select [facility], and log in with your MyChart username and password.

## 2022-05-25 DIAGNOSIS — E1142 Type 2 diabetes mellitus with diabetic polyneuropathy: Secondary | ICD-10-CM | POA: Diagnosis not present

## 2022-05-25 DIAGNOSIS — E1122 Type 2 diabetes mellitus with diabetic chronic kidney disease: Secondary | ICD-10-CM | POA: Diagnosis not present

## 2022-05-25 DIAGNOSIS — K219 Gastro-esophageal reflux disease without esophagitis: Secondary | ICD-10-CM | POA: Diagnosis not present

## 2022-05-25 DIAGNOSIS — I10 Essential (primary) hypertension: Secondary | ICD-10-CM | POA: Diagnosis not present

## 2022-05-25 DIAGNOSIS — E782 Mixed hyperlipidemia: Secondary | ICD-10-CM | POA: Diagnosis not present

## 2022-05-25 DIAGNOSIS — D649 Anemia, unspecified: Secondary | ICD-10-CM | POA: Diagnosis not present

## 2022-05-25 DIAGNOSIS — G3184 Mild cognitive impairment, so stated: Secondary | ICD-10-CM | POA: Diagnosis not present

## 2022-05-25 DIAGNOSIS — N1831 Chronic kidney disease, stage 3a: Secondary | ICD-10-CM | POA: Diagnosis not present

## 2022-05-25 DIAGNOSIS — L97519 Non-pressure chronic ulcer of other part of right foot with unspecified severity: Secondary | ICD-10-CM | POA: Diagnosis not present

## 2022-05-25 DIAGNOSIS — E1140 Type 2 diabetes mellitus with diabetic neuropathy, unspecified: Secondary | ICD-10-CM | POA: Diagnosis not present

## 2022-05-25 DIAGNOSIS — C9000 Multiple myeloma not having achieved remission: Secondary | ICD-10-CM | POA: Diagnosis not present

## 2022-05-26 ENCOUNTER — Other Ambulatory Visit: Payer: Self-pay

## 2022-05-28 NOTE — Progress Notes (Signed)
The patient has been taking dexamethasone at home prior to daratumumab injections. After discussion with the provider, the offset time of daratumumab was changed to 30 minutes, starting 05/30/2022, in order to reduce patient wait time. A calendar was not sent to the patient because he has already been taking dexamethasone at home.  Laray Anger, PharmD PGY-2 Pharmacy Resident Hematology/Oncology [phone]  05/28/2022 4:52 PM

## 2022-05-29 DIAGNOSIS — Z20822 Contact with and (suspected) exposure to covid-19: Secondary | ICD-10-CM | POA: Diagnosis not present

## 2022-05-30 ENCOUNTER — Other Ambulatory Visit: Payer: Self-pay

## 2022-05-30 ENCOUNTER — Inpatient Hospital Stay: Payer: Medicare Other

## 2022-05-30 ENCOUNTER — Inpatient Hospital Stay: Payer: Medicare Other | Admitting: Hematology

## 2022-05-30 VITALS — BP 116/54 | HR 70 | Temp 97.4°F | Resp 16 | Wt 176.6 lb

## 2022-05-30 DIAGNOSIS — C9000 Multiple myeloma not having achieved remission: Secondary | ICD-10-CM

## 2022-05-30 DIAGNOSIS — Z5112 Encounter for antineoplastic immunotherapy: Secondary | ICD-10-CM | POA: Diagnosis not present

## 2022-05-30 DIAGNOSIS — E876 Hypokalemia: Secondary | ICD-10-CM

## 2022-05-30 LAB — CMP (CANCER CENTER ONLY)
ALT: 13 U/L (ref 0–44)
AST: 14 U/L — ABNORMAL LOW (ref 15–41)
Albumin: 3.2 g/dL — ABNORMAL LOW (ref 3.5–5.0)
Alkaline Phosphatase: 104 U/L (ref 38–126)
Anion gap: 9 (ref 5–15)
BUN: 18 mg/dL (ref 8–23)
CO2: 26 mmol/L (ref 22–32)
Calcium: 8.2 mg/dL — ABNORMAL LOW (ref 8.9–10.3)
Chloride: 99 mmol/L (ref 98–111)
Creatinine: 1.13 mg/dL (ref 0.61–1.24)
GFR, Estimated: >60 mL/min (ref >60)
Glucose, Bld: 127 mg/dL — ABNORMAL HIGH (ref 70–99)
Potassium: 3.3 mmol/L — ABNORMAL LOW (ref 3.5–5.1)
Sodium: 134 mmol/L — ABNORMAL LOW (ref 135–145)
Total Bilirubin: 0.7 mg/dL (ref 0.3–1.2)
Total Protein: 7.3 g/dL (ref 6.5–8.1)

## 2022-05-30 LAB — CBC WITH DIFFERENTIAL/PLATELET
Abs Immature Granulocytes: 0.04 10*3/uL (ref 0.00–0.07)
Basophils Absolute: 0.1 10*3/uL (ref 0.0–0.1)
Basophils Relative: 1 %
Eosinophils Absolute: 0.5 10*3/uL (ref 0.0–0.5)
Eosinophils Relative: 9 %
HCT: 36.8 % — ABNORMAL LOW (ref 39.0–52.0)
Hemoglobin: 12.8 g/dL — ABNORMAL LOW (ref 13.0–17.0)
Immature Granulocytes: 1 %
Lymphocytes Relative: 22 %
Lymphs Abs: 1.2 10*3/uL (ref 0.7–4.0)
MCH: 34 pg (ref 26.0–34.0)
MCHC: 34.8 g/dL (ref 30.0–36.0)
MCV: 97.9 fL (ref 80.0–100.0)
Monocytes Absolute: 0.8 10*3/uL (ref 0.1–1.0)
Monocytes Relative: 15 %
Neutro Abs: 2.9 10*3/uL (ref 1.7–7.7)
Neutrophils Relative %: 52 %
Platelets: 344 10*3/uL (ref 150–400)
RBC: 3.76 MIL/uL — ABNORMAL LOW (ref 4.22–5.81)
RDW: 13.8 % (ref 11.5–15.5)
WBC: 5.5 10*3/uL (ref 4.0–10.5)
nRBC: 0 % (ref 0.0–0.2)

## 2022-05-30 LAB — MAGNESIUM: Magnesium: 1.6 mg/dL — ABNORMAL LOW (ref 1.7–2.4)

## 2022-05-30 MED ORDER — DARATUMUMAB-HYALURONIDASE-FIHJ 1800-30000 MG-UT/15ML ~~LOC~~ SOLN
1800.0000 mg | Freq: Once | SUBCUTANEOUS | Status: AC
  Administered 2022-05-30: 1800 mg via SUBCUTANEOUS
  Filled 2022-05-30: qty 15

## 2022-05-30 MED ORDER — CETIRIZINE HCL 10 MG PO TABS
10.0000 mg | ORAL_TABLET | Freq: Once | ORAL | Status: AC
  Administered 2022-05-30: 10 mg via ORAL
  Filled 2022-05-30: qty 1

## 2022-05-30 MED ORDER — MAGNESIUM OXIDE 400 MG PO TABS
400.0000 mg | ORAL_TABLET | Freq: Once | ORAL | Status: AC
  Administered 2022-05-30: 400 mg via ORAL
  Filled 2022-05-30: qty 1

## 2022-05-30 MED ORDER — POTASSIUM CHLORIDE CRYS ER 20 MEQ PO TBCR
20.0000 meq | EXTENDED_RELEASE_TABLET | Freq: Once | ORAL | Status: AC
  Administered 2022-05-30: 20 meq via ORAL
  Filled 2022-05-30: qty 1

## 2022-05-30 MED ORDER — ACETAMINOPHEN 325 MG PO TABS
650.0000 mg | ORAL_TABLET | Freq: Once | ORAL | Status: AC
  Administered 2022-05-30: 650 mg via ORAL
  Filled 2022-05-30: qty 2

## 2022-05-30 MED ORDER — MAGNESIUM OXIDE -MG SUPPLEMENT 400 (240 MG) MG PO TABS: 400.0000 mg | ORAL_TABLET | Freq: Every day | ORAL | 3 refills | Status: DC

## 2022-05-30 NOTE — Patient Instructions (Addendum)
[place] [facility] Discharge Instructions   You were seen and examined today by Dr. Delton Coombes.  He reviewed the results of your lab work which are normal/stable. Your potassium and magnesium are slightly low at 3.3 and 1.6 respectively. We will give you potassium and magnesium pills in the clinic today. Dr. Raliegh Ip sent a prescription to your pharmacy for magnesium pills. Take as prescribed.   Continue Revlimid as prescribed.   We will proceed with your treatment today.   Return as scheduled.    Thank you for choosing [place] at [facility] to provide your oncology and hematology care.  To afford each patient quality time with our provider, please arrive at least 15 minutes before your scheduled appointment time.   If you have a lab appointment with the [facility] please come in thru the Main Entrance and check in at the main information desk.  You need to re-schedule your appointment should you arrive 10 or more minutes late.  We strive to give you quality time with our providers, and arriving late affects you and other patients whose appointments are after yours.  Also, if you no show three or more times for appointments you may be dismissed from the clinic at the providers discretion.     Again, thank you for choosing [facility].  Our hope is that these requests will decrease the amount of time that you wait before being seen by our physicians.       _____________________________________________________________  Should you have questions after your visit to [facility], please contact our office at [phone] and follow the prompts.  Our office hours are 8:00 a.m. and 4:30 p.m. Monday - Friday.  Please note that voicemails left after 4:00 p.m. may not be returned until the following business day.  We are closed weekends and major holidays.  You do have access to a nurse 24-7, just call the main number to  the clinic [phone] and do not press any options, hold on the line and a nurse will answer the phone.    For prescription refill requests, have your pharmacy contact our office and allow 72 hours.    Due to Covid, you will need to wear a mask upon entering the hospital. If you do not have a mask, a mask will be given to you at the Main Entrance upon arrival. For doctor visits, patients may have 1 support person age 65 or older with them. For treatment visits, patients can not have anyone with them due to social distancing guidelines and our immunocompromised population.

## 2022-05-30 NOTE — Patient Instructions (Signed)
[facility]  Discharge Instructions: Thank you for choosing [facility] to provide your oncology and hematology care.  If you have a lab appointment with the Cancer Center, please come in thru the Main Entrance and check in at the main information desk.  Wear comfortable clothing and clothing appropriate for easy access to any Portacath or PICC line.   We strive to give you quality time with your provider. You may need to reschedule your appointment if you arrive late (15 or more minutes).  Arriving late affects you and other patients whose appointments are after yours.  Also, if you miss three or more appointments without notifying the office, you may be dismissed from the clinic at the provider's discretion.      For prescription refill requests, have your pharmacy contact our office and allow 72 hours for refills to be completed.    Today you received the following chemotherapy and/or immunotherapy agents Daratumumab.  Daratumumab; Hyaluronidase Injection What is this medication? DARATUMUMAB; HYALURONIDASE (dar a toom ue mab; hye al ur ON i dase) treats multiple myeloma, a type of bone marrow cancer. Daratumumab works by blocking a protein that causes cancer cells to grow and multiply. This helps to slow or stop the spread of cancer cells. Hyaluronidase works by increasing the absorption of other medications in the body to help them work better. This medication may also be used treat amyloidosis, a condition that causes the buildup of a protein (amyloid) in your body. It works by reducing the buildup of this protein, which decreases symptoms. It is a combination medication that contains a monoclonal antibody. This medicine may be used for other purposes; ask your health care provider or pharmacist if you have questions. COMMON BRAND NAME(S): DARZALEX FASPRO What should I tell my care team before I take this medication? They need to know if you have any of  these conditions: Heart disease Infection, such as chickenpox, cold sores, herpes, hepatitis B Lung or breathing disease An unusual or allergic reaction to daratumumab, hyaluronidase, other medications, foods, dyes, or preservatives Pregnant or trying to get pregnant Breast-feeding How should I use this medication? This medication is injected under the skin. It is given by your care team in a hospital or clinic setting. Talk to your care team about the use of this medication in children. Special care may be needed. Overdosage: If you think you have taken too much of this medicine contact a poison control center or emergency room at once. NOTE: This medicine is only for you. Do not share this medicine with others. What if I miss a dose? Keep appointments for follow-up doses. It is important not to miss your dose. Call your care team if you are unable to keep an appointment. What may interact with this medication? Interactions have not been studied. This list may not describe all possible interactions. Give your health care provider a list of all the medicines, herbs, non-prescription drugs, or dietary supplements you use. Also tell them if you smoke, drink alcohol, or use illegal drugs. Some items may interact with your medicine. What should I watch for while using this medication? Your condition will be monitored carefully while you are receiving this medication. This medication can cause serious allergic reactions. To reduce your risk, your care team may give you other medication to take before receiving this one. Be sure to follow the directions from your care team. This medication can affect the results of blood tests to match   your blood type. These changes can last for up to 6 months after the final dose. Your care team will do blood tests to match your blood type before you start treatment. Tell all of your care team that you are being treated with this medication before receiving a blood  transfusion. This medication can affect the results of some tests used to determine treatment response; extra tests may be needed to evaluate response. Talk to your care team if you wish to become pregnant or think you are pregnant. This medication can cause serious birth defects if taken during pregnancy and for 3 months after the last dose. A reliable form of contraception is recommended while taking this medication and for 3 months after the last dose. Talk to your care team about effective forms of contraception. Do not breast-feed while taking this medication. What side effects may I notice from receiving this medication? Side effects that you should report to your care team as soon as possible: Allergic reactions--skin rash, itching, hives, swelling of the face, lips, tongue, or throat Heart rhythm changes--fast or irregular heartbeat, dizziness, feeling faint or lightheaded, chest pain, trouble breathing Infection--fever, chills, cough, sore throat, wounds that don't heal, pain or trouble when passing urine, general feeling of discomfort or being unwell Infusion reactions--chest pain, shortness of breath or trouble breathing, feeling faint or lightheaded Sudden eye pain or change in vision such as blurry vision, seeing halos around lights, vision loss Unusual bruising or bleeding Side effects that usually do not require medical attention (report to your care team if they continue or are bothersome): Constipation Diarrhea Fatigue Nausea Pain, tingling, or numbness in the hands or feet Swelling of the ankles, hands, or feet This list may not describe all possible side effects. Call your doctor for medical advice about side effects. You may report side effects to FDA at 1-800-FDA-1088. Where should I keep my medication? This medication is given in a hospital or clinic. It will not be stored at home. NOTE: This sheet is a summary. It may not cover all possible information. If you have  questions about this medicine, talk to your doctor, pharmacist, or health care provider.  2023 Elsevier/Gold Standard (2021-07-12 00:00:00)        To help prevent nausea and vomiting after your treatment, we encourage you to take your nausea medication as directed.  BELOW ARE SYMPTOMS THAT SHOULD BE REPORTED IMMEDIATELY: *FEVER GREATER THAN 100.4 F (38 C) OR HIGHER *CHILLS OR SWEATING *NAUSEA AND VOMITING THAT IS NOT CONTROLLED WITH YOUR NAUSEA MEDICATION *UNUSUAL SHORTNESS OF BREATH *UNUSUAL BRUISING OR BLEEDING *URINARY PROBLEMS (pain or burning when urinating, or frequent urination) *BOWEL PROBLEMS (unusual diarrhea, constipation, pain near the anus) TENDERNESS IN MOUTH AND THROAT WITH OR WITHOUT PRESENCE OF ULCERS (sore throat, sores in mouth, or a toothache) UNUSUAL RASH, SWELLING OR PAIN  UNUSUAL VAGINAL DISCHARGE OR ITCHING   Items with * indicate a potential emergency and should be followed up as soon as possible or go to the Emergency Department if any problems should occur.  Please show the CHEMOTHERAPY ALERT CARD or IMMUNOTHERAPY ALERT CARD at check-in to the Emergency Department and triage nurse.  Should you have questions after your visit or need to cancel or reschedule your appointment, please contact [facility] [phone]  and follow the prompts.  Office hours are 8:00 a.m. to 4:30 p.m. Monday - Friday. Please note that voicemails left after 4:00 p.m. may not be returned until the following business day.    We are closed weekends and major holidays. You have access to a nurse at all times for urgent questions. Please call the main number to the clinic [phone] and follow the prompts.  For any non-urgent questions, you may also contact your provider using MyChart. We now offer e-Visits for anyone 18 and older to request care online for non-urgent symptoms. For details visit [email or web address].   Also download the MyChart app! Go to the app  store, search "MyChart", open the app, select [facility], and log in with your MyChart username and password.   

## 2022-05-30 NOTE — Progress Notes (Signed)
Patient is taking Revlimid as prescribed.  He has not missed any doses and reports no side effects at this time.    Patient has been examined by Dr. Delton Coombes. Vital signs and labs have been reviewed by MD - ANC, Creatinine, LFTs, hemoglobin, and platelets are within treatment parameters per M.D. - pt may proceed with treatment.  Primary RN and pharmacy notified.

## 2022-05-30 NOTE — Progress Notes (Signed)
Patient is taking Revlimid as prescribed.  He has not missed any doses and reports no side effects at this time.

## 2022-05-30 NOTE — Progress Notes (Signed)
[place], [name] [address]   CLINIC:  Medical Oncology/Hematology  PCP:  [name] Squibb, MD [address] [name] [address]  [phone]  REASON FOR VISIT:  Follow-up for IgG kappa multiple myeloma  PRIOR THERAPY: none  CURRENT THERAPY: Daratumumab, Revlimid and dexamethasone  INTERVAL HISTORY:  [name], a 77 y.o. male, seen for follow-up of multiple myeloma and toxicity assessment prior to next treatment of daratumumab.  He is tolerating Revlimid very well.  He is also tolerating Darzalex very well.  He does not report any cough or shortness of breath.  Denies any GI symptoms.  REVIEW OF SYSTEMS:  Review of Systems  Neurological:  Positive for numbness (Tingling in the feet and legs).  All other systems reviewed and are negative.   PAST MEDICAL/SURGICAL HISTORY:  Past Medical History:  Diagnosis Date   Adverse effect of unspecified drugs, medicaments and biological substances, initial encounter    Essential (primary) hypertension    Hyperlipidemia, unspecified    Impotence of organic origin    Malignant melanoma of skin, unspecified ([place])    Malignant neoplasm of prostate ([place])    Multiple myeloma (HCC)    Neuropathy    Non-pressure chronic ulcer of other part of unspecified foot with unspecified severity (HCC)    Reflux esophagitis    Restless legs syndrome    Tinea unguium    Type 2 diabetes mellitus with diabetic neuropathy, unspecified ([place])    Type 2 diabetes mellitus with other specified complication (HCC)    Unspecified atrial fibrillation (HCC)    Past Surgical History:  Procedure Laterality Date   CATARACT EXTRACTION     HERNIA REPAIR     melanoma removal     PROSTATE SURGERY      SOCIAL HISTORY:  Social History   Socioeconomic History   Marital status: Married    Spouse name: Not on file   Number of children: Not on file   Years of education: Not on file   Highest education level:  Not on file  Occupational History   Not on file  Tobacco Use   Smoking status: Never   Smokeless tobacco: Never  Vaping Use   Vaping Use: Never used  Substance and Sexual Activity   Alcohol use: No    Alcohol/week: 0.0 standard drinks of alcohol   Drug use: No   Sexual activity: Not Currently  Other Topics Concern   Not on file  Social History Narrative   He worked as a Production assistant, 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   Social Determinants of Health   Financial Resource Strain: Low Risk  (05/13/2020)   Overall Financial Resource Strain (CARDIA)    Difficulty of Paying Living Expenses: Not hard at all  Food Insecurity: No Food Insecurity (05/13/2020)   Hunger Vital Sign    Worried About Running Out of Food in the Last Year: Never true    Ran Out of Food in the Last Year: Never true  Transportation Needs: No Transportation Needs (05/13/2020)   PRAPARE - Hydrologist (Medical): No    Lack of Transportation (Non-Medical): No  Physical Activity: Insufficiently Active (05/13/2020)   Exercise Vital Sign    Days of Exercise per Week: 3 days    Minutes of Exercise per Session: 20 min  Stress: No Stress Concern Present (05/13/2020)   [place]  Feeling of Stress : Not at all  Social Connections: Moderately Integrated (05/13/2020)   Social Connection and Isolation Panel [NHANES]    Frequency of Communication with Friends and Family: Twice a week    Frequency of Social Gatherings with Friends and Family: Twice a week    Attends Religious Services: 1 to 4 times per year    Active Member of [employer] or Organizations: No    Attends Archivist Meetings: Never    Marital Status: Married  Human resources officer Violence: Not At Risk (05/13/2020)   Humiliation, Afraid, Rape, and Kick questionnaire    Fear of Current or Ex-Partner: No    Emotionally Abused: No    Physically Abused: No     Sexually Abused: No    FAMILY HISTORY:  Family History  Problem Relation Age of Onset   Multiple myeloma Mother    Acute myelogenous leukemia Father    Leukemia Son        acute lymphocytic leukemia    CURRENT MEDICATIONS:  Current Outpatient Medications  Medication Sig Dispense Refill   ACCU-CHEK AVIVA PLUS test strip USE 1 STRIP TO CHECK GLUCOSE TWICE DAILY     Accu-Chek FastClix Lancets MISC Apply topically 2 (two) times daily.     acyclovir (ZOVIRAX) 400 MG tablet Take 1 tablet (400 mg total) by mouth 2 (two) times daily. 60 tablet 6   atenolol-chlorthalidone (TENORETIC) 50-25 MG per tablet Take 1 tablet by mouth daily. Takes 1/2 daily per Dr Luan Pulling     cyanocobalamin (VITAMIN B12) 1000 MCG/ML injection Inject 1,000 mcg into the muscle every 30 (thirty) days.     Daratumumab-Hyaluronidase-fihj (DARZALEX FASPRO [place]) Inject into the skin.     dexamethasone (DECADRON) 4 MG tablet Take 5 tablets (20 mg total) by mouth once a week. 30 tablet 6   diclofenac sodium (VOLTAREN) 1 % GEL Apply 2 g topically daily as needed.     gabapentin (NEURONTIN) 300 MG capsule Take 300 mg by mouth 3 (three) times daily.     lenalidomide (REVLIMID) 10 MG capsule Take 1 capsule (10 mg total) by mouth daily. 21 days on, 7 days off 21 capsule 0   loperamide (IMODIUM A-D) 2 MG tablet Take 2 mg by mouth 4 (four) times daily as needed for diarrhea or loose stools.     losartan (COZAAR) 50 MG tablet Take 50 mg by mouth daily.     metFORMIN (GLUCOPHAGE) 500 MG tablet Take 500 mg by mouth 2 (two) times daily with a meal.     pantoprazole (PROTONIX) 40 MG tablet Take 40 mg by mouth daily.     silver sulfADIAZINE (SILVADENE) 1 % cream Apply topically.     prochlorperazine (COMPAZINE) 10 MG tablet Take 1 tablet (10 mg total) by mouth every 6 (six) hours as needed for nausea or vomiting. (Patient not taking: Reported on 05/30/2022) 30 tablet 3   No current facility-administered medications for this visit.     ALLERGIES:  Allergies  Allergen Reactions   Penicillins Rash    Also swelling reaction    Gadavist [Gadobutrol] Nausea Only    Patient became nauseated immediately after contrast injection.  Was better after a few minutes.     PHYSICAL EXAM:  Performance status (ECOG): 1 - Symptomatic but completely ambulatory  Vitals:   05/30/22 1241  BP: (!) 116/54  Pulse: 70  Resp: 16  Temp: (!) 97.4 F (36.3 C)  SpO2: 99%   Wt Readings from Last 3 Encounters:  05/30/22  176 lb 9.6 oz (80.1 kg)  05/23/22 174 lb 14.4 oz (79.3 kg)  05/15/22 176 lb 12.8 oz (80.2 kg)   Physical Exam Vitals reviewed.  Constitutional:      Appearance: Normal appearance.  Cardiovascular:     Rate and Rhythm: Normal rate and regular rhythm.     Pulses: Normal pulses.     Heart sounds: Normal heart sounds.  Pulmonary:     Effort: Pulmonary effort is normal.     Breath sounds: Normal breath sounds.  Neurological:     General: No focal deficit present.     Mental Status: He is alert and oriented to person, place, and time.  Psychiatric:        Mood and Affect: Mood normal.        Behavior: Behavior normal.    LABORATORY DATA:  I have reviewed the labs as listed.     Latest Ref Rng & Units 05/30/2022   12:15 PM 05/23/2022    9:30 AM 05/15/2022    9:06 AM  CBC  WBC 4.0 - 10.5 K/uL 5.5  5.0  6.5   Hemoglobin 13.0 - 17.0 g/dL 12.8  12.6  13.1   Hematocrit 39.0 - 52.0 % 36.8  36.9  38.4   Platelets 150 - 400 K/uL 344  255  247       Latest Ref Rng & Units 05/30/2022   12:15 PM 05/23/2022    9:30 AM 05/15/2022    9:06 AM  CMP  Glucose 70 - 99 mg/dL 127  198  261   BUN 8 - 23 mg/dL '18  26  31   '$ Creatinine 0.61 - 1.24 mg/dL 1.13  1.33  1.29   Sodium 135 - 145 mmol/L 134  134  132   Potassium 3.5 - 5.1 mmol/L 3.3  3.8  4.1   Chloride 98 - 111 mmol/L 99  98  97   CO2 22 - 32 mmol/L '26  25  26   '$ Calcium 8.9 - 10.3 mg/dL 8.2  7.9  8.1   Total Protein 6.5 - 8.1 g/dL 7.3  7.2  8.3   Total Bilirubin  0.3 - 1.2 mg/dL 0.7  1.1  1.3   Alkaline Phos 38 - 126 U/L 104  88  80   AST 15 - 41 U/L '14  22  15   '$ ALT 0 - 44 U/L '13  15  11       '$ Component Value Date/Time   RBC 3.76 (L) 05/30/2022 1215   MCV 97.9 05/30/2022 1215   MCH 34.0 05/30/2022 1215   MCHC 34.8 05/30/2022 1215   RDW 13.8 05/30/2022 1215   LYMPHSABS 1.2 05/30/2022 1215   MONOABS 0.8 05/30/2022 1215   EOSABS 0.5 05/30/2022 1215   BASOSABS 0.1 05/30/2022 1215    DIAGNOSTIC IMAGING:  I have independently reviewed the scans and discussed with the patient. No results found.   ASSESSMENT:  IgG kappa smoldering multiple myeloma: - Skeletal survey in February 2022 was negative. - Bone marrow biopsy on 06/06/2020 with 20% plasma cells.  Myeloma FISH panel with monosomy 13/deletion 13.  Chromosome analysis 46, XY (20). - No "crab" features. - PET scan from 06/29/2021: No evidence of myeloma or plasmacytoma. - Skeletal survey on 06/13/2021: No lytic lesions. - MRI of the cervical/thoracic/lumbar spine negative for bone lesions with some degenerative changes. - BMBX (02/01/2022): Hypercellular marrow with plasma cells representing 35% of all cells.  They display kappa light chain restriction.  Cytogenetics-46,  XY (20). - Myeloma FISH panel: Monosomy 13. - PET scan (04/05/2022): No suspicious bony lesions or soft tissue masses.   2.  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 - No major chemical exposures.  He had personal history of melanoma of the upper lip. - Father had acute myeloid leukemia.  Mother had multiple myeloma.  Son died of acute lymphocytic leukemia   PLAN:  1.  Stage II IgG kappa multiple myeloma, standard risk: - He has tolerated first cycle of Dara, Revlimid and dexamethasone very well. - I have reviewed labs from today which showed creatinine has improved to 1.13.  LFTs are normal.  CBC shows improved hemoglobin to 12.8.  White count and platelet count is  normal. - He will proceed with cycle 2 of DRD today. - Continue omeprazole and 20 mg weekly on Wednesdays. - We have sent myeloma panel which is pending. - He started Revlimid 10 mg 3 weeks on/1 week off on 05/29/2022. - As he is tolerating it well, will increase Revlimid to 15 mg 3 weeks on/1 week off with cycle 3. - RTC 4 weeks for follow-up.   2.  Peripheral neuropathy: - Continue gabapentin 300 mg 4 times daily.  Fairly well-controlled.  3.  Prophylaxis: - Continue acyclovir 400 mg twice daily and aspirin 81 mg daily.  4.  Bone strengthening agents: - He does not have any bone lesions.  Will consider ordering DEXA scan at next visit.  5.  Hypomagnesemia: - Magnesium is 1.6.  It has been consistently low. - Will start him on magnesium 400 mg daily.  Orders placed this encounter:  No orders of the defined types were placed in this encounter.     Derek Jack, MD [place] [phone]

## 2022-05-30 NOTE — Progress Notes (Signed)
Patient presents today for Daratumumab injection.  Patient is in satisfactory condition with no new complaints voiced. Vital signs are stable.  Labs reviewed by Dr. Delton Coombes during his office visit.  Potassium today is 3.3 and magnesium is 1.6.  We will give Mag Ox 400 mg PO x one dose and Klor Con 20 mEq PO x done dose today per Dr. Delton Coombes.  All other labs are within treatment parameters. We will proceed with injection per MD orders.   Patient tolerated injection well with no complaints voiced.  Patient left ambulatory in stable condition.  Vital signs stable at discharge.  Follow up as scheduled.

## 2022-05-31 ENCOUNTER — Other Ambulatory Visit: Payer: Self-pay

## 2022-05-31 DIAGNOSIS — L97511 Non-pressure chronic ulcer of other part of right foot limited to breakdown of skin: Secondary | ICD-10-CM | POA: Diagnosis not present

## 2022-05-31 LAB — KAPPA/LAMBDA LIGHT CHAINS
Kappa free light chain: 29.2 mg/L — ABNORMAL HIGH (ref 3.3–19.4)
Kappa, lambda light chain ratio: 2.1 — ABNORMAL HIGH (ref 0.26–1.65)
Lambda free light chains: 13.9 mg/L (ref 5.7–26.3)

## 2022-06-04 LAB — PROTEIN ELECTROPHORESIS, SERUM
A/G Ratio: 1 (ref 0.7–1.7)
Albumin ELP: 3.4 g/dL (ref 2.9–4.4)
Alpha-1-Globulin: 0.2 g/dL (ref 0.0–0.4)
Alpha-2-Globulin: 0.6 g/dL (ref 0.4–1.0)
Beta Globulin: 0.7 g/dL (ref 0.7–1.3)
Gamma Globulin: 1.9 g/dL — ABNORMAL HIGH (ref 0.4–1.8)
Globulin, Total: 3.4 g/dL (ref 2.2–3.9)
M-Spike, %: 1.7 g/dL — ABNORMAL HIGH
Total Protein ELP: 6.8 g/dL (ref 6.0–8.5)

## 2022-06-04 IMAGING — CT CT BIOPSY
1 of 2 series · 15 of 28 positions shown, 19 images · non-contrast
Comparison: none

CLINICAL DATA: Monoclonal gammopathy of unknown significance

EXAM:
CT GUIDED DEEP ILIAC BONE ASPIRATION AND CORE BIOPSY
TECHNIQUE: Patient was placed prone on the CT gantry and limited axial scans
through the pelvis were obtained. Appropriate skin entry site was
identified. Skin site was marked, prepped with chlorhexidine, draped
in usual sterile fashion, and infiltrated locally with 1% lidocaine.

[Series 2: i-spiral 5.0 br40 · axial · 0.95mm/px · z∈[-131,-57]mm · 15 of 25 slices shown, 19 images]
[im 2/25  mediastinal]
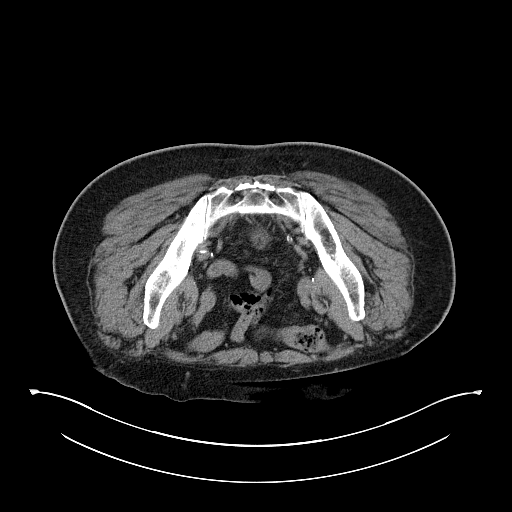
[im 2/25  lung]
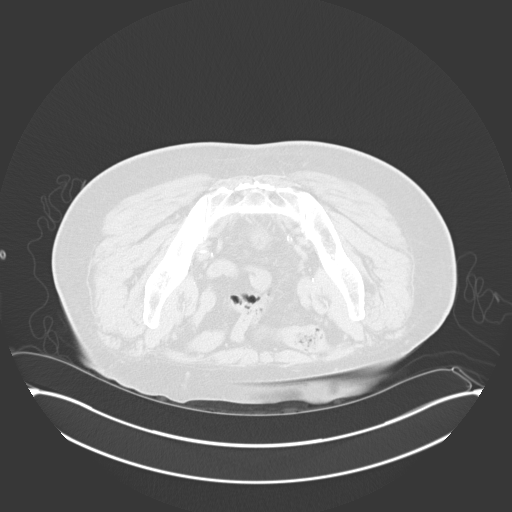
[im 4/25  lung]
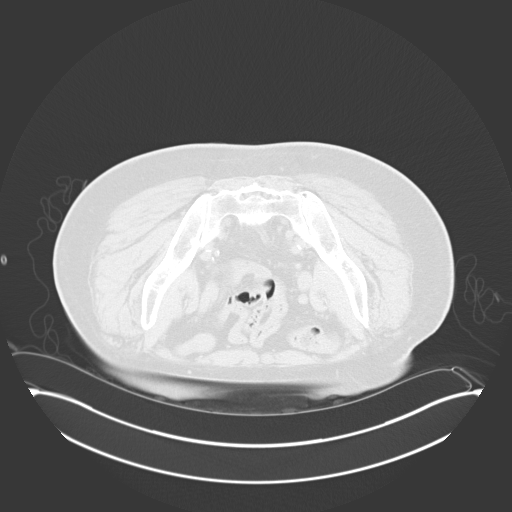
[im 5/25  lung]
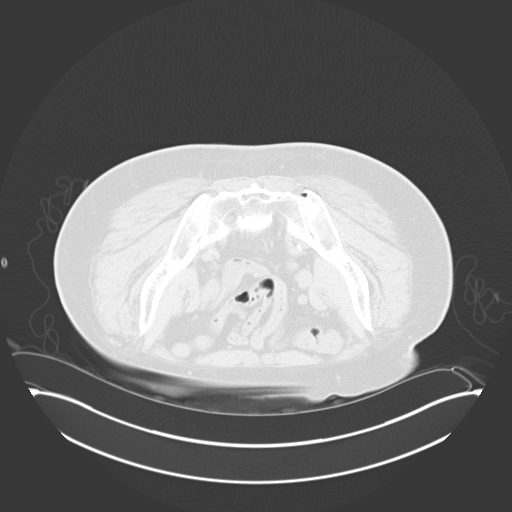
[im 6/25  lung]
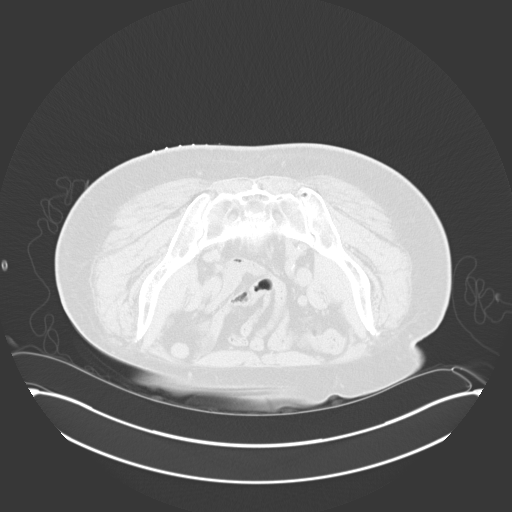
[im 8/25  mediastinal]
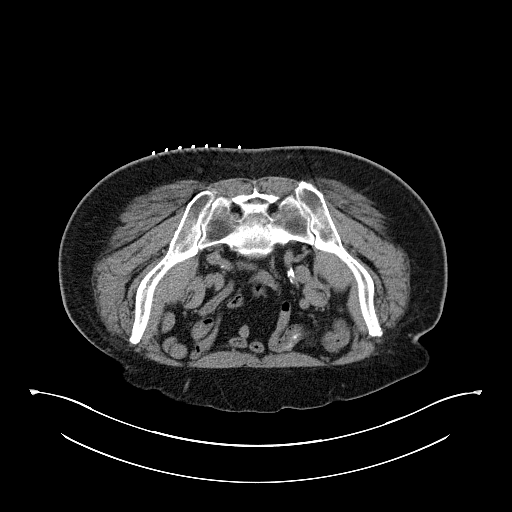
[im 8/25  lung]
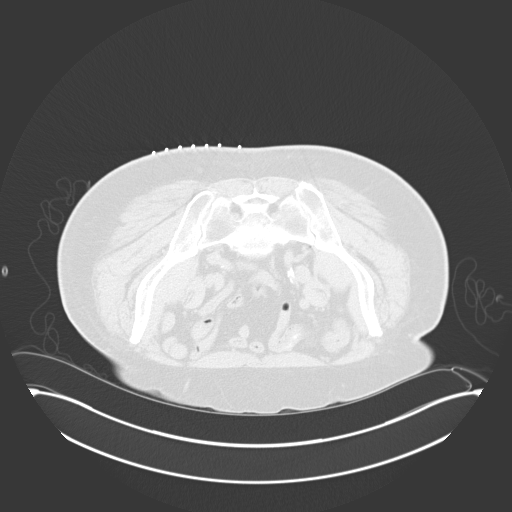
[im 9/25  lung]
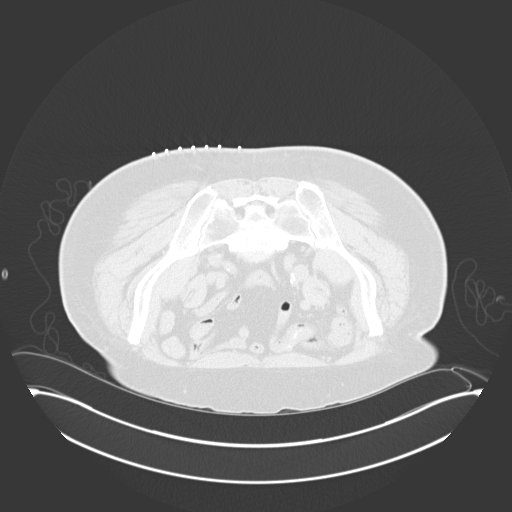
[im 11/25  lung]
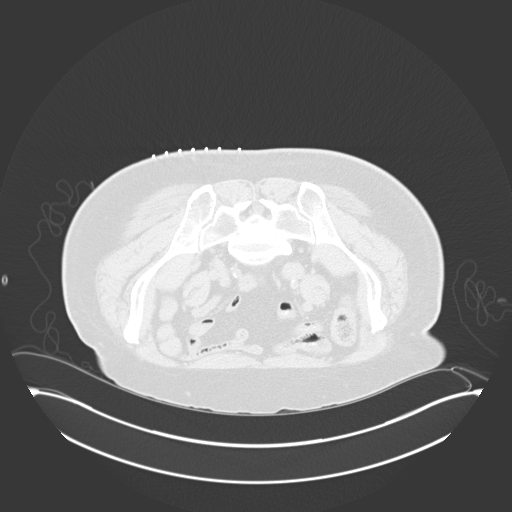
[im 13/25  lung]
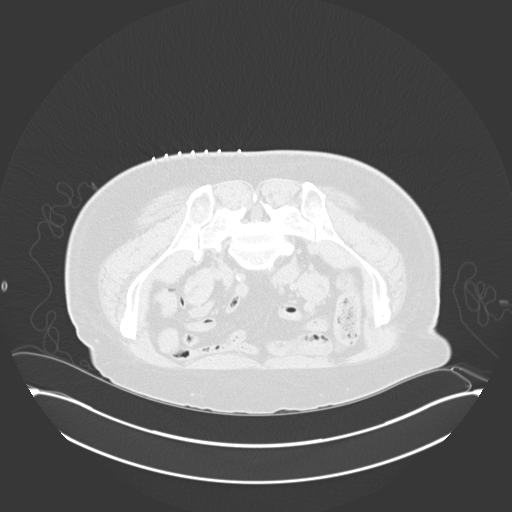
[im 14/25  mediastinal]
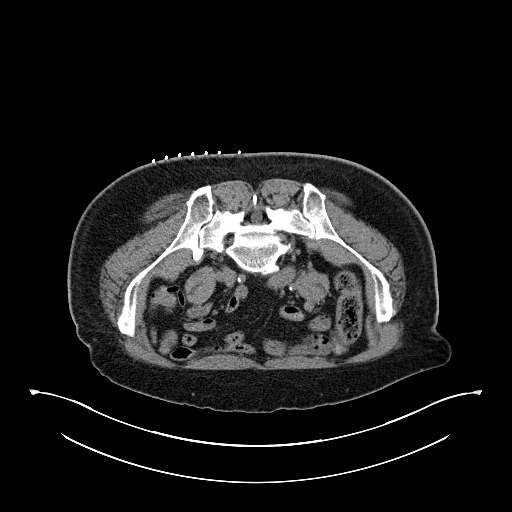
[im 14/25  lung]
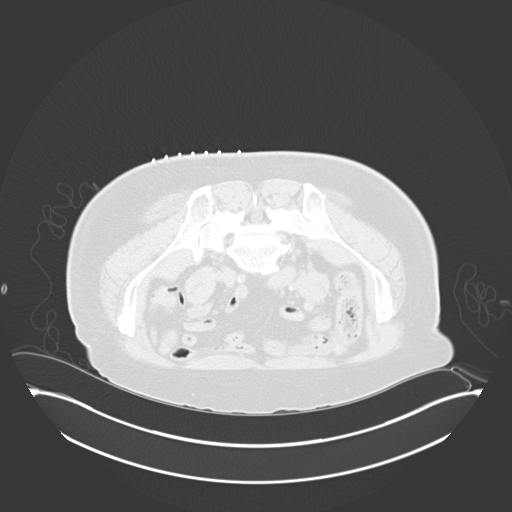
[im 16/25  lung]
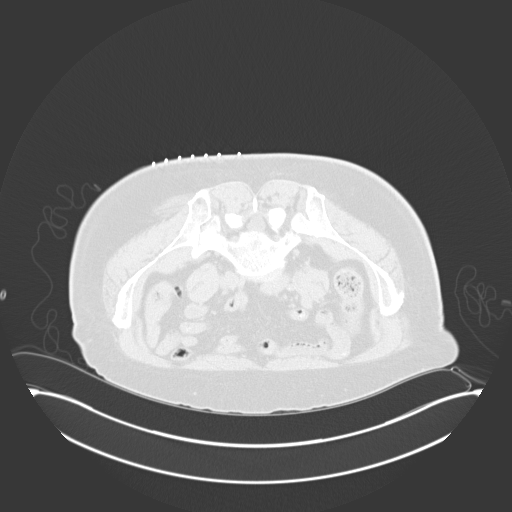
[im 17/25  lung]
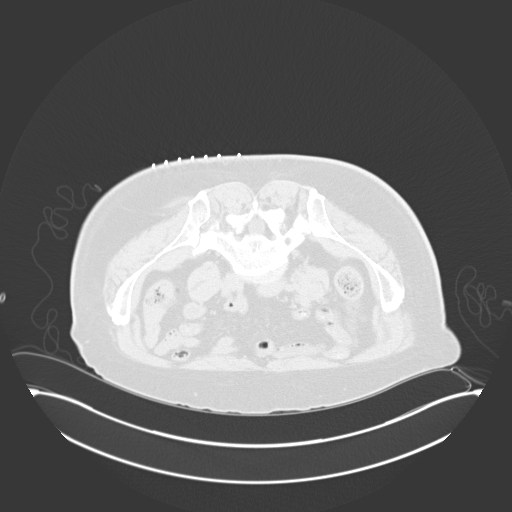
[im 19/25  lung]
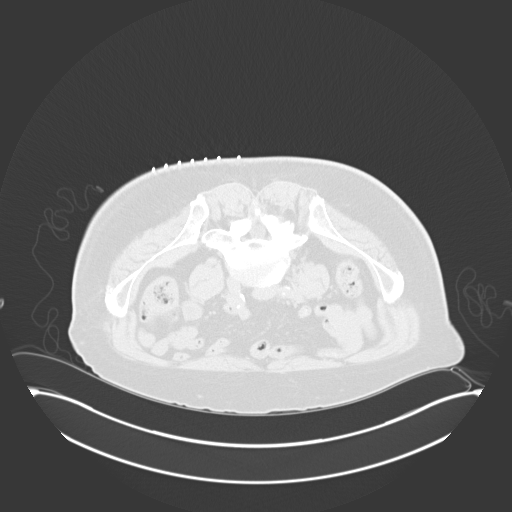
[im 20/25  mediastinal]
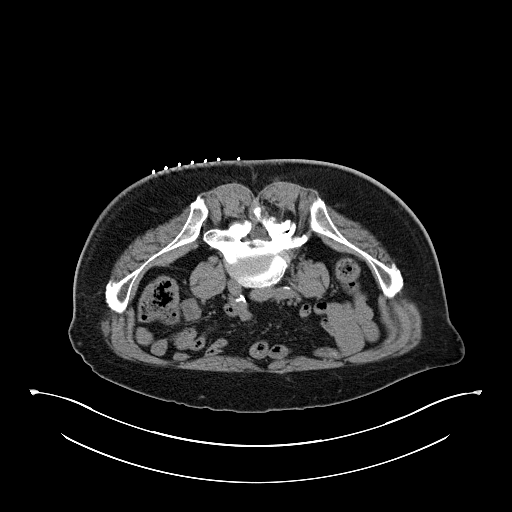
[im 20/25  lung]
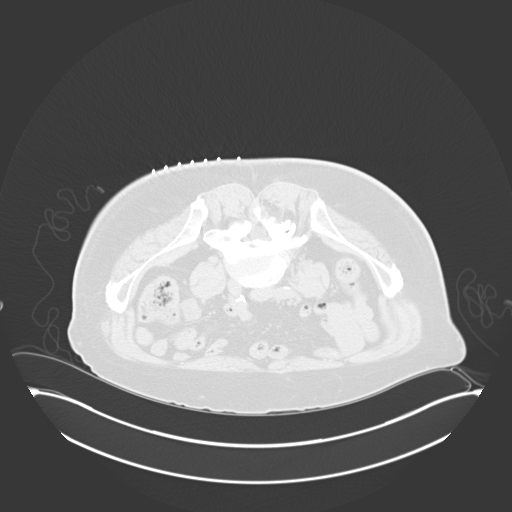
[im 21/25  lung]
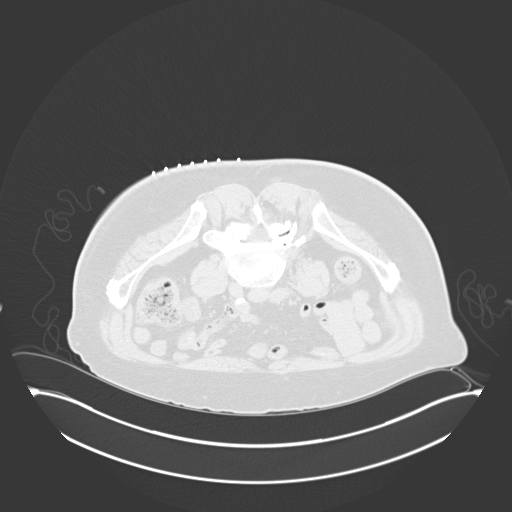
[im 23/25  lung]
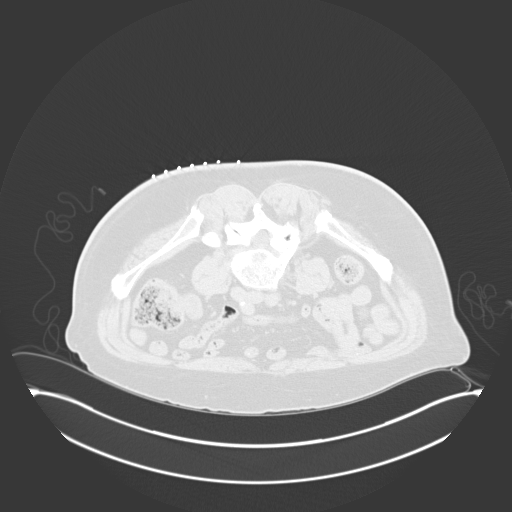

[15 of 28 positions shown; findings below may reference images not displayed]

Intravenous Fentanyl 711mcg and Versed 2mg were administered as
conscious sedation during continuous monitoring of the patient's
level of consciousness and physiological / cardiorespiratory status
by the radiology RN, with a total moderate sedation time of 11
minutes.

Under CT fluoroscopic guidance an 11-gauge Cook trocar bone needle
was advanced into the right iliac bone just lateral to the
sacroiliac joint. Once needle tip position was confirmed, core and
aspiration samples were obtained, submitted to pathology for
approval. Post procedure scans show no hematoma or fracture. Patient
tolerated procedure well.

COMPLICATIONS:
COMPLICATIONS
none
IMPRESSION: 1. Technically successful CT guided right iliac bone core and
aspiration biopsy.

## 2022-06-06 ENCOUNTER — Other Ambulatory Visit: Payer: Self-pay | Admitting: Hematology

## 2022-06-06 ENCOUNTER — Inpatient Hospital Stay: Payer: Medicare Other

## 2022-06-06 ENCOUNTER — Inpatient Hospital Stay: Payer: Medicare Other | Attending: Hematology

## 2022-06-06 ENCOUNTER — Inpatient Hospital Stay: Payer: Medicare Other | Admitting: Hematology

## 2022-06-06 VITALS — BP 117/54 | HR 77 | Temp 97.6°F | Resp 20 | Wt 177.8 lb

## 2022-06-06 DIAGNOSIS — C9000 Multiple myeloma not having achieved remission: Secondary | ICD-10-CM | POA: Insufficient documentation

## 2022-06-06 DIAGNOSIS — Z5112 Encounter for antineoplastic immunotherapy: Secondary | ICD-10-CM | POA: Diagnosis not present

## 2022-06-06 LAB — CBC WITH DIFFERENTIAL/PLATELET
Abs Immature Granulocytes: 0.04 10*3/uL (ref 0.00–0.07)
Basophils Absolute: 0.1 10*3/uL (ref 0.0–0.1)
Basophils Relative: 1 %
Eosinophils Absolute: 0.7 10*3/uL — ABNORMAL HIGH (ref 0.0–0.5)
Eosinophils Relative: 12 %
HCT: 37.6 % — ABNORMAL LOW (ref 39.0–52.0)
Hemoglobin: 12.9 g/dL — ABNORMAL LOW (ref 13.0–17.0)
Immature Granulocytes: 1 %
Lymphocytes Relative: 9 %
Lymphs Abs: 0.6 10*3/uL — ABNORMAL LOW (ref 0.7–4.0)
MCH: 33.4 pg (ref 26.0–34.0)
MCHC: 34.3 g/dL (ref 30.0–36.0)
MCV: 97.4 fL (ref 80.0–100.0)
Monocytes Absolute: 0.3 10*3/uL (ref 0.1–1.0)
Monocytes Relative: 4 %
Neutro Abs: 4.4 10*3/uL (ref 1.7–7.7)
Neutrophils Relative %: 73 %
Platelets: 274 10*3/uL (ref 150–400)
RBC: 3.86 MIL/uL — ABNORMAL LOW (ref 4.22–5.81)
RDW: 13.4 % (ref 11.5–15.5)
WBC: 6 10*3/uL (ref 4.0–10.5)
nRBC: 0 % (ref 0.0–0.2)

## 2022-06-06 LAB — CMP (CANCER CENTER ONLY)
ALT: 9 U/L (ref 0–44)
AST: 23 U/L (ref 15–41)
Albumin: 3.2 g/dL — ABNORMAL LOW (ref 3.5–5.0)
Alkaline Phosphatase: 95 U/L (ref 38–126)
Anion gap: 11 (ref 5–15)
BUN: 19 mg/dL (ref 8–23)
CO2: 26 mmol/L (ref 22–32)
Calcium: 8.1 mg/dL — ABNORMAL LOW (ref 8.9–10.3)
Chloride: 97 mmol/L — ABNORMAL LOW (ref 98–111)
Creatinine: 1.23 mg/dL (ref 0.61–1.24)
GFR, Estimated: >60 mL/min (ref >60)
Glucose, Bld: 255 mg/dL — ABNORMAL HIGH (ref 70–99)
Potassium: 3.1 mmol/L — ABNORMAL LOW (ref 3.5–5.1)
Sodium: 134 mmol/L — ABNORMAL LOW (ref 135–145)
Total Bilirubin: 1.3 mg/dL — ABNORMAL HIGH (ref 0.3–1.2)
Total Protein: 6.8 g/dL (ref 6.5–8.1)

## 2022-06-06 LAB — MAGNESIUM: Magnesium: 1.5 mg/dL — ABNORMAL LOW (ref 1.7–2.4)

## 2022-06-06 MED ORDER — POTASSIUM CHLORIDE CRYS ER 20 MEQ PO TBCR
40.0000 meq | EXTENDED_RELEASE_TABLET | Freq: Once | ORAL | Status: AC
  Administered 2022-06-06: 40 meq via ORAL
  Filled 2022-06-06: qty 2

## 2022-06-06 MED ORDER — DARATUMUMAB-HYALURONIDASE-FIHJ 1800-30000 MG-UT/15ML ~~LOC~~ SOLN
1800.0000 mg | Freq: Once | SUBCUTANEOUS | Status: AC
  Administered 2022-06-06: 1800 mg via SUBCUTANEOUS
  Filled 2022-06-06: qty 15

## 2022-06-06 MED ORDER — MAGNESIUM OXIDE -MG SUPPLEMENT 400 (240 MG) MG PO TABS: 400.0000 mg | ORAL_TABLET | Freq: Two times a day (BID) | ORAL | 3 refills | Status: DC

## 2022-06-06 MED ORDER — CETIRIZINE HCL 10 MG PO TABS
10.0000 mg | ORAL_TABLET | Freq: Every day | ORAL | Status: DC
  Administered 2022-06-06: 10 mg via ORAL
  Filled 2022-06-06: qty 1

## 2022-06-06 MED ORDER — ACETAMINOPHEN 325 MG PO TABS
650.0000 mg | ORAL_TABLET | Freq: Once | ORAL | Status: AC
  Administered 2022-06-06: 650 mg via ORAL
  Filled 2022-06-06: qty 2

## 2022-06-06 NOTE — Progress Notes (Signed)
Pt presents today for [name] [name] per provider's order. Vital signs and other labs WNL for treatment. Pt's magnesium is 1.5 and potassium is 3.1 today. Pt will receive 40 mEq potassium chloride p.o x 1 dose per Dr.K's standing order. Dr.K stated for pt to take two extra magnesium oxide 400 mg p.o  today when pt gets home and increase magnesium oxide 400 mg twice daily starting tomorrow. Pt made aware and verbalized understanding.  [name] [place] and 40 mEq potassium chloride p.o x 1 dose given today per MD orders. Tolerated infusion without adverse affects. Vital signs stable. No complaints at this time. Discharged from clinic ambulatory with cane in stable condition. Alert and oriented x 3. F/U with [facility] as scheduled.

## 2022-06-06 NOTE — Patient Instructions (Signed)
[name] [name]  Discharge Instructions: Thank you for choosing [facility] to provide your oncology and hematology care.  If you have a lab appointment with the [facility], please come in thru the Main Entrance and check in at the main information desk.  Wear comfortable clothing and clothing appropriate for easy access to any Portacath or PICC line.   We strive to give you quality time with your provider. You may need to reschedule your appointment if you arrive late (15 or more minutes).  Arriving late affects you and other patients whose appointments are after yours.  Also, if you miss three or more appointments without notifying the office, you may be dismissed from the clinic at the provider's discretion.      For prescription refill requests, have your pharmacy contact our office and allow 72 hours for refills to be completed.    Today you received the following chemotherapy and/or immunotherapy agents [name] [name]   To help prevent nausea and vomiting after your treatment, we encourage you to take your nausea medication as directed.  Daratumumab Injection What is this medication? DARATUMUMAB (dar a toom ue mab) treats multiple myeloma, a type of bone marrow cancer. It works by helping your immune system slow or stop the spread of cancer cells. It is a monoclonal antibody. This medicine may be used for other purposes; ask your health care provider or pharmacist if you have questions. COMMON BRAND NAME(S): DARZALEX What should I tell my care team before I take this medication? They need to know if you have any of these conditions: Hereditary fructose intolerance Infection, such as chickenpox, herpes, hepatitis B Lung or breathing disease, such as asthma, COPD An unusual or allergic reaction to daratumumab, sorbitol, other medications, foods, dyes, or preservatives Pregnant or trying to get pregnant Breastfeeding How should I use this medication? This  medication is injected into a vein. It is given by your care team in a hospital or clinic setting. Talk to your care team about the use of this medication in children. Special care may be needed. Overdosage: If you think you have taken too much of this medicine contact a poison control center or emergency room at once. NOTE: This medicine is only for you. Do not share this medicine with others. What if I miss a dose? Keep appointments for follow-up doses. It is important not to miss your dose. Call your care team if you are unable to keep an appointment. What may interact with this medication? Interactions have not been studied. This list may not describe all possible interactions. Give your health care provider a list of all the medicines, herbs, non-prescription drugs, or dietary supplements you use. Also tell them if you smoke, drink alcohol, or use illegal drugs. Some items may interact with your medicine. What should I watch for while using this medication? Your condition will be monitored carefully while you are receiving this medication. This medication can cause serious allergic reactions. To reduce your risk, your care team may give you other medication to take before receiving this one. Be sure to follow the directions from your care team. This medication can affect the results of blood tests to match your blood type. These changes can last for up to 6 months after the final dose. Your care team will do blood tests to match your blood type before you start treatment. Tell all of your care team that you are being treated with this medication before receiving a  blood transfusion. This medication can affect the results of some tests used to determine treatment response; extra tests may be needed to evaluate response. Talk to your care team if you wish to become pregnant or think you are pregnant. This medication can cause serious birth defects if taken during pregnancy and for 3 months after the  last dose. A reliable form of contraception is recommended while taking this medication and for 3 months after the last dose. Talk to your care team about effective forms of contraception. Do not breast-feed while taking this medication. What side effects may I notice from receiving this medication? Side effects that you should report to your care team as soon as possible: Allergic reactions--skin rash, itching, hives, swelling of the face, lips, tongue, or throat Infection--fever, chills, cough, sore throat, wounds that don't heal, pain or trouble when passing urine, general feeling of discomfort or being unwell Infusion reactions--chest pain, shortness of breath or trouble breathing, feeling faint or lightheaded Unusual bruising or bleeding Side effects that usually do not require medical attention (report to your care team if they continue or are bothersome): Constipation Diarrhea Fatigue Nausea Pain, tingling, or numbness in the hands or feet Swelling of the ankles, hands, or feet This list may not describe all possible side effects. Call your doctor for medical advice about side effects. You may report side effects to FDA at 1-800-FDA-1088. Where should I keep my medication? This medication is given in a hospital or clinic. It will not be stored at home. NOTE: This sheet is a summary. It may not cover all possible information. If you have questions about this medicine, talk to your doctor, pharmacist, or health care provider.  2023 Elsevier/Gold Standard (2021-07-12 00:00:00)   BELOW ARE SYMPTOMS THAT SHOULD BE REPORTED IMMEDIATELY: *FEVER GREATER THAN 100.4 F (38 C) OR HIGHER *CHILLS OR SWEATING *NAUSEA AND VOMITING THAT IS NOT CONTROLLED WITH YOUR NAUSEA MEDICATION *UNUSUAL SHORTNESS OF BREATH *UNUSUAL BRUISING OR BLEEDING *URINARY PROBLEMS (pain or burning when urinating, or frequent urination) *BOWEL PROBLEMS (unusual diarrhea, constipation, pain near the anus) TENDERNESS IN  MOUTH AND THROAT WITH OR WITHOUT PRESENCE OF ULCERS (sore throat, sores in mouth, or a toothache) UNUSUAL RASH, SWELLING OR PAIN  UNUSUAL VAGINAL DISCHARGE OR ITCHING   Items with * indicate a potential emergency and should be followed up as soon as possible or go to the Emergency Department if any problems should occur.  Please show the CHEMOTHERAPY ALERT CARD or IMMUNOTHERAPY ALERT CARD at check-in to the Emergency Department and triage nurse.  Should you have questions after your visit or need to cancel or reschedule your appointment, please contact [facility] [phone]  and follow the prompts.  Office hours are 8:00 a.m. to 4:30 p.m. Monday - Friday. Please note that voicemails left after 4:00 p.m. may not be returned until the following business day.  We are closed weekends and major holidays. You have access to a nurse at all times for urgent questions. Please call the main number to the clinic [phone] and follow the prompts.  For any non-urgent questions, you may also contact your provider using MyChart. We now offer e-Visits for anyone 74 and older to request care online for non-urgent symptoms. For details visit [email or web address].   Also download the MyChart app! Go to the app store, search "MyChart", open the app, select [facility], and log in with your MyChart username and password.

## 2022-06-12 ENCOUNTER — Other Ambulatory Visit: Payer: Self-pay

## 2022-06-12 DIAGNOSIS — C9000 Multiple myeloma not having achieved remission: Secondary | ICD-10-CM

## 2022-06-13 ENCOUNTER — Ambulatory Visit: Payer: Medicare Other | Admitting: Hematology

## 2022-06-13 ENCOUNTER — Inpatient Hospital Stay: Payer: Medicare Other

## 2022-06-13 ENCOUNTER — Telehealth: Payer: Self-pay | Admitting: Hematology

## 2022-06-13 VITALS — BP 118/75 | HR 87 | Temp 97.6°F | Resp 20 | Wt 176.6 lb

## 2022-06-13 DIAGNOSIS — C9000 Multiple myeloma not having achieved remission: Secondary | ICD-10-CM | POA: Diagnosis not present

## 2022-06-13 DIAGNOSIS — Z5112 Encounter for antineoplastic immunotherapy: Secondary | ICD-10-CM | POA: Diagnosis not present

## 2022-06-13 LAB — CBC WITH DIFFERENTIAL/PLATELET
Abs Immature Granulocytes: 0.02 10*3/uL (ref 0.00–0.07)
Basophils Absolute: 0.1 10*3/uL (ref 0.0–0.1)
Basophils Relative: 1 %
Eosinophils Absolute: 0.9 10*3/uL — ABNORMAL HIGH (ref 0.0–0.5)
Eosinophils Relative: 14 %
HCT: 38 % — ABNORMAL LOW (ref 39.0–52.0)
Hemoglobin: 13 g/dL (ref 13.0–17.0)
Immature Granulocytes: 0 %
Lymphocytes Relative: 19 %
Lymphs Abs: 1.2 10*3/uL (ref 0.7–4.0)
MCH: 32.7 pg (ref 26.0–34.0)
MCHC: 34.2 g/dL (ref 30.0–36.0)
MCV: 95.5 fL (ref 80.0–100.0)
Monocytes Absolute: 1 10*3/uL (ref 0.1–1.0)
Monocytes Relative: 15 %
Neutro Abs: 3.3 10*3/uL (ref 1.7–7.7)
Neutrophils Relative %: 51 %
Platelets: 254 10*3/uL (ref 150–400)
RBC: 3.98 MIL/uL — ABNORMAL LOW (ref 4.22–5.81)
RDW: 13.2 % (ref 11.5–15.5)
WBC: 6.5 10*3/uL (ref 4.0–10.5)
nRBC: 0 % (ref 0.0–0.2)

## 2022-06-13 LAB — COMPREHENSIVE METABOLIC PANEL
ALT: 12 U/L (ref 0–44)
AST: 17 U/L (ref 15–41)
Albumin: 3.3 g/dL — ABNORMAL LOW (ref 3.5–5.0)
Alkaline Phosphatase: 99 U/L (ref 38–126)
Anion gap: 10 (ref 5–15)
BUN: 21 mg/dL (ref 8–23)
CO2: 26 mmol/L (ref 22–32)
Calcium: 8.2 mg/dL — ABNORMAL LOW (ref 8.9–10.3)
Chloride: 99 mmol/L (ref 98–111)
Creatinine, Ser: 1.15 mg/dL (ref 0.61–1.24)
GFR, Estimated: >60 mL/min (ref >60)
Glucose, Bld: 159 mg/dL — ABNORMAL HIGH (ref 70–99)
Potassium: 3 mmol/L — ABNORMAL LOW (ref 3.5–5.1)
Sodium: 135 mmol/L (ref 135–145)
Total Bilirubin: 0.8 mg/dL (ref 0.3–1.2)
Total Protein: 6.7 g/dL (ref 6.5–8.1)

## 2022-06-13 LAB — MAGNESIUM: Magnesium: 1.8 mg/dL (ref 1.7–2.4)

## 2022-06-13 MED ORDER — POTASSIUM CHLORIDE CRYS ER 20 MEQ PO TBCR
40.0000 meq | EXTENDED_RELEASE_TABLET | Freq: Once | ORAL | Status: AC
  Administered 2022-06-13: 40 meq via ORAL
  Filled 2022-06-13: qty 2

## 2022-06-13 MED ORDER — CETIRIZINE HCL 10 MG PO TABS
10.0000 mg | ORAL_TABLET | Freq: Once | ORAL | Status: AC
  Administered 2022-06-13: 10 mg via ORAL
  Filled 2022-06-13: qty 1

## 2022-06-13 MED ORDER — DARATUMUMAB-HYALURONIDASE-FIHJ 1800-30000 MG-UT/15ML ~~LOC~~ SOLN
1800.0000 mg | Freq: Once | SUBCUTANEOUS | Status: AC
  Administered 2022-06-13: 1800 mg via SUBCUTANEOUS
  Filled 2022-06-13: qty 15

## 2022-06-13 MED ORDER — ACETAMINOPHEN 325 MG PO TABS
650.0000 mg | ORAL_TABLET | Freq: Once | ORAL | Status: AC
  Administered 2022-06-13: 650 mg via ORAL
  Filled 2022-06-13: qty 2

## 2022-06-13 NOTE — Patient Instructions (Signed)
[facility]  Discharge Instructions: Thank you for choosing [facility] to provide your oncology and hematology care.  If you have a lab appointment with the [facility], please come in thru the Main Entrance and check in at the main information desk.  Wear comfortable clothing and clothing appropriate for easy access to any Portacath or PICC line.   We strive to give you quality time with your provider. You may need to reschedule your appointment if you arrive late (15 or more minutes).  Arriving late affects you and other patients whose appointments are after yours.  Also, if you miss three or more appointments without notifying the office, you may be dismissed from the clinic at the provider's discretion.      For prescription refill requests, have your pharmacy contact our office and allow 72 hours for refills to be completed.    Today you received the following chemotherapy and/or immunotherapy agents Darzalex Faspro . Daratumumab Injection What is this medication? DARATUMUMAB (dar a toom ue mab) treats multiple myeloma, a type of bone marrow cancer. It works by helping your immune system slow or stop the spread of cancer cells. It is a monoclonal antibody. This medicine may be used for other purposes; ask your health care provider or pharmacist if you have questions. COMMON BRAND NAME(S): DARZALEX What should I tell my care team before I take this medication? They need to know if you have any of these conditions: Hereditary fructose intolerance Infection, such as chickenpox, herpes, hepatitis B Lung or breathing disease, such as asthma, COPD An unusual or allergic reaction to daratumumab, sorbitol, other medications, foods, dyes, or preservatives Pregnant or trying to get pregnant Breastfeeding How should I use this medication? This medication is injected into a vein. It is given by your care team in a hospital or clinic setting. Talk to your care  team about the use of this medication in children. Special care may be needed. Overdosage: If you think you have taken too much of this medicine contact a poison control center or emergency room at once. NOTE: This medicine is only for you. Do not share this medicine with others. What if I miss a dose? Keep appointments for follow-up doses. It is important not to miss your dose. Call your care team if you are unable to keep an appointment. What may interact with this medication? Interactions have not been studied. This list may not describe all possible interactions. Give your health care provider a list of all the medicines, herbs, non-prescription drugs, or dietary supplements you use. Also tell them if you smoke, drink alcohol, or use illegal drugs. Some items may interact with your medicine. What should I watch for while using this medication? Your condition will be monitored carefully while you are receiving this medication. This medication can cause serious allergic reactions. To reduce your risk, your care team may give you other medication to take before receiving this one. Be sure to follow the directions from your care team. This medication can affect the results of blood tests to match your blood type. These changes can last for up to 6 months after the final dose. Your care team will do blood tests to match your blood type before you start treatment. Tell all of your care team that you are being treated with this medication before receiving a blood transfusion. This medication can affect the results of some tests used to determine treatment response; extra tests may be needed  to evaluate response. Talk to your care team if you wish to become pregnant or think you are pregnant. This medication can cause serious birth defects if taken during pregnancy and for 3 months after the last dose. A reliable form of contraception is recommended while taking this medication and for 3 months after the  last dose. Talk to your care team about effective forms of contraception. Do not breast-feed while taking this medication. What side effects may I notice from receiving this medication? Side effects that you should report to your care team as soon as possible: Allergic reactions--skin rash, itching, hives, swelling of the face, lips, tongue, or throat Infection--fever, chills, cough, sore throat, wounds that don't heal, pain or trouble when passing urine, general feeling of discomfort or being unwell Infusion reactions--chest pain, shortness of breath or trouble breathing, feeling faint or lightheaded Unusual bruising or bleeding Side effects that usually do not require medical attention (report to your care team if they continue or are bothersome): Constipation Diarrhea Fatigue Nausea Pain, tingling, or numbness in the hands or feet Swelling of the ankles, hands, or feet This list may not describe all possible side effects. Call your doctor for medical advice about side effects. You may report side effects to FDA at 1-800-FDA-1088. Where should I keep my medication? This medication is given in a hospital or clinic. It will not be stored at home. NOTE: This sheet is a summary. It may not cover all possible information. If you have questions about this medicine, talk to your doctor, pharmacist, or health care provider.  2023 Elsevier/Gold Standard (2021-07-12 00:00:00)       To help prevent nausea and vomiting after your treatment, we encourage you to take your nausea medication as directed.  BELOW ARE SYMPTOMS THAT SHOULD BE REPORTED IMMEDIATELY: *FEVER GREATER THAN 100.4 F (38 C) OR HIGHER *CHILLS OR SWEATING *NAUSEA AND VOMITING THAT IS NOT CONTROLLED WITH YOUR NAUSEA MEDICATION *UNUSUAL SHORTNESS OF BREATH *UNUSUAL BRUISING OR BLEEDING *URINARY PROBLEMS (pain or burning when urinating, or frequent urination) *BOWEL PROBLEMS (unusual diarrhea, constipation, pain near the  anus) TENDERNESS IN MOUTH AND THROAT WITH OR WITHOUT PRESENCE OF ULCERS (sore throat, sores in mouth, or a toothache) UNUSUAL RASH, SWELLING OR PAIN  UNUSUAL VAGINAL DISCHARGE OR ITCHING   Items with * indicate a potential emergency and should be followed up as soon as possible or go to the Emergency Department if any problems should occur.  Please show the CHEMOTHERAPY ALERT CARD or IMMUNOTHERAPY ALERT CARD at check-in to the Emergency Department and triage nurse.  Should you have questions after your visit or need to cancel or reschedule your appointment, please contact [facility] [phone]  and follow the prompts.  Office hours are 8:00 a.m. to 4:30 p.m. Monday - Friday. Please note that voicemails left after 4:00 p.m. may not be returned until the following business day.  We are closed weekends and major holidays. You have access to a nurse at all times for urgent questions. Please call the main number to the clinic [phone] and follow the prompts.  For any non-urgent questions, you may also contact your provider using MyChart. We now offer e-Visits for anyone 1 and older to request care online for non-urgent symptoms. For details visit [email or web address].   Also download the MyChart app! Go to the app store, search "MyChart", open the app, select [facility], and log in with your MyChart username and password.

## 2022-06-13 NOTE — Progress Notes (Signed)
Patient presents today for Darzalex Faspro [place]. Vital signs and labs within parameters for treatment. Potassium 3.0 . Patient will receive 40 mEq PO potassium per Dr. Tomie China standing orders. Patient taking Revlimid as prescribed with no complaints of any side effects.   Treatment given today per MD orders. Tolerated infusion without adverse affects. Vital signs stable. No complaints at this time. Discharged from clinic ambulatory in stable condition. Alert and oriented x 3. F/U with [facility] as scheduled.

## 2022-06-14 ENCOUNTER — Other Ambulatory Visit: Payer: Self-pay

## 2022-06-14 MED ORDER — LENALIDOMIDE 15 MG PO CAPS: 15.0000 mg | ORAL_CAPSULE | Freq: Every day | ORAL | 0 refills | Status: DC

## 2022-06-14 NOTE — Telephone Encounter (Signed)
Chart reviewed. Revlimid refilled with dosage increase to '15mg'$  per last office note with Dr. Delton Coombes.

## 2022-06-19 ENCOUNTER — Other Ambulatory Visit: Payer: Self-pay

## 2022-06-19 DIAGNOSIS — C9000 Multiple myeloma not having achieved remission: Secondary | ICD-10-CM

## 2022-06-20 ENCOUNTER — Inpatient Hospital Stay: Payer: Medicare Other

## 2022-06-20 ENCOUNTER — Ambulatory Visit: Payer: Medicare Other | Admitting: Hematology

## 2022-06-20 VITALS — BP 124/71 | HR 86 | Temp 96.9°F | Resp 18

## 2022-06-20 DIAGNOSIS — C9000 Multiple myeloma not having achieved remission: Secondary | ICD-10-CM | POA: Diagnosis not present

## 2022-06-20 DIAGNOSIS — Z5112 Encounter for antineoplastic immunotherapy: Secondary | ICD-10-CM | POA: Diagnosis not present

## 2022-06-20 LAB — CBC WITH DIFFERENTIAL/PLATELET
Abs Immature Granulocytes: 0.03 10*3/uL (ref 0.00–0.07)
Basophils Absolute: 0.1 10*3/uL (ref 0.0–0.1)
Basophils Relative: 2 %
Eosinophils Absolute: 1 10*3/uL — ABNORMAL HIGH (ref 0.0–0.5)
Eosinophils Relative: 17 %
HCT: 41 % (ref 39.0–52.0)
Hemoglobin: 13.9 g/dL (ref 13.0–17.0)
Immature Granulocytes: 1 %
Lymphocytes Relative: 14 %
Lymphs Abs: 0.8 10*3/uL (ref 0.7–4.0)
MCH: 32.9 pg (ref 26.0–34.0)
MCHC: 33.9 g/dL (ref 30.0–36.0)
MCV: 97.2 fL (ref 80.0–100.0)
Monocytes Absolute: 0.6 10*3/uL (ref 0.1–1.0)
Monocytes Relative: 11 %
Neutro Abs: 3.1 10*3/uL (ref 1.7–7.7)
Neutrophils Relative %: 55 %
Platelets: 203 10*3/uL (ref 150–400)
RBC: 4.22 MIL/uL (ref 4.22–5.81)
RDW: 13.5 % (ref 11.5–15.5)
WBC: 5.5 10*3/uL (ref 4.0–10.5)
nRBC: 0 % (ref 0.0–0.2)

## 2022-06-20 LAB — CMP (CANCER CENTER ONLY)
ALT: 13 U/L (ref 0–44)
AST: 15 U/L (ref 15–41)
Albumin: 3.6 g/dL (ref 3.5–5.0)
Alkaline Phosphatase: 105 U/L (ref 38–126)
Anion gap: 10 (ref 5–15)
BUN: 18 mg/dL (ref 8–23)
CO2: 27 mmol/L (ref 22–32)
Calcium: 8.4 mg/dL — ABNORMAL LOW (ref 8.9–10.3)
Chloride: 98 mmol/L (ref 98–111)
Creatinine: 1.16 mg/dL (ref 0.61–1.24)
GFR, Estimated: >60 mL/min (ref >60)
Glucose, Bld: 222 mg/dL — ABNORMAL HIGH (ref 70–99)
Potassium: 3.9 mmol/L (ref 3.5–5.1)
Sodium: 135 mmol/L (ref 135–145)
Total Bilirubin: 0.9 mg/dL (ref 0.3–1.2)
Total Protein: 7.1 g/dL (ref 6.5–8.1)

## 2022-06-20 LAB — MAGNESIUM: Magnesium: 1.8 mg/dL (ref 1.7–2.4)

## 2022-06-20 MED ORDER — ACETAMINOPHEN 325 MG PO TABS
650.0000 mg | ORAL_TABLET | Freq: Once | ORAL | Status: AC
  Administered 2022-06-20: 650 mg via ORAL
  Filled 2022-06-20: qty 2

## 2022-06-20 MED ORDER — CETIRIZINE HCL 10 MG PO TABS
10.0000 mg | ORAL_TABLET | Freq: Once | ORAL | Status: AC
  Administered 2022-06-20: 10 mg via ORAL
  Filled 2022-06-20: qty 1

## 2022-06-20 MED ORDER — DARATUMUMAB-HYALURONIDASE-FIHJ 1800-30000 MG-UT/15ML ~~LOC~~ SOLN
1800.0000 mg | Freq: Once | SUBCUTANEOUS | Status: AC
  Administered 2022-06-20: 1800 mg via SUBCUTANEOUS
  Filled 2022-06-20: qty 15

## 2022-06-20 NOTE — Progress Notes (Signed)
Patient presents today for Daratumumab injection.  Patient is in satisfactory condition with no new complaints voiced.  Vital signs are stable.  Labs reviewed and all labs are within treatment parameters.  We will proceed with with injection per MD orders.   Patient tolerated injection well with no complaints voiced.  Patient left ambulatory with wife in stable condition.  Vital signs stable at discharge.  Follow up as scheduled.

## 2022-06-20 NOTE — Patient Instructions (Signed)
[facility]  Discharge Instructions: Thank you for choosing [facility] to provide your oncology and hematology care.  If you have a lab appointment with the Cancer Center, please come in thru the Main Entrance and check in at the main information desk.  Wear comfortable clothing and clothing appropriate for easy access to any Portacath or PICC line.   We strive to give you quality time with your provider. You may need to reschedule your appointment if you arrive late (15 or more minutes).  Arriving late affects you and other patients whose appointments are after yours.  Also, if you miss three or more appointments without notifying the office, you may be dismissed from the clinic at the provider's discretion.      For prescription refill requests, have your pharmacy contact our office and allow 72 hours for refills to be completed.    Today you received the following chemotherapy and/or immunotherapy agents Daratumumab.  Daratumumab; Hyaluronidase Injection What is this medication? DARATUMUMAB; HYALURONIDASE (dar a toom ue mab; hye al ur ON i dase) treats multiple myeloma, a type of bone marrow cancer. Daratumumab works by blocking a protein that causes cancer cells to grow and multiply. This helps to slow or stop the spread of cancer cells. Hyaluronidase works by increasing the absorption of other medications in the body to help them work better. This medication may also be used treat amyloidosis, a condition that causes the buildup of a protein (amyloid) in your body. It works by reducing the buildup of this protein, which decreases symptoms. It is a combination medication that contains a monoclonal antibody. This medicine may be used for other purposes; ask your health care provider or pharmacist if you have questions. COMMON BRAND NAME(S): DARZALEX FASPRO What should I tell my care team before I take this medication? They need to know if you have any of  these conditions: Heart disease Infection, such as chickenpox, cold sores, herpes, hepatitis B Lung or breathing disease An unusual or allergic reaction to daratumumab, hyaluronidase, other medications, foods, dyes, or preservatives Pregnant or trying to get pregnant Breast-feeding How should I use this medication? This medication is injected under the skin. It is given by your care team in a hospital or clinic setting. Talk to your care team about the use of this medication in children. Special care may be needed. Overdosage: If you think you have taken too much of this medicine contact a poison control center or emergency room at once. NOTE: This medicine is only for you. Do not share this medicine with others. What if I miss a dose? Keep appointments for follow-up doses. It is important not to miss your dose. Call your care team if you are unable to keep an appointment. What may interact with this medication? Interactions have not been studied. This list may not describe all possible interactions. Give your health care provider a list of all the medicines, herbs, non-prescription drugs, or dietary supplements you use. Also tell them if you smoke, drink alcohol, or use illegal drugs. Some items may interact with your medicine. What should I watch for while using this medication? Your condition will be monitored carefully while you are receiving this medication. This medication can cause serious allergic reactions. To reduce your risk, your care team may give you other medication to take before receiving this one. Be sure to follow the directions from your care team. This medication can affect the results of blood tests to match   your blood type. These changes can last for up to 6 months after the final dose. Your care team will do blood tests to match your blood type before you start treatment. Tell all of your care team that you are being treated with this medication before receiving a blood  transfusion. This medication can affect the results of some tests used to determine treatment response; extra tests may be needed to evaluate response. Talk to your care team if you wish to become pregnant or think you are pregnant. This medication can cause serious birth defects if taken during pregnancy and for 3 months after the last dose. A reliable form of contraception is recommended while taking this medication and for 3 months after the last dose. Talk to your care team about effective forms of contraception. Do not breast-feed while taking this medication. What side effects may I notice from receiving this medication? Side effects that you should report to your care team as soon as possible: Allergic reactions--skin rash, itching, hives, swelling of the face, lips, tongue, or throat Heart rhythm changes--fast or irregular heartbeat, dizziness, feeling faint or lightheaded, chest pain, trouble breathing Infection--fever, chills, cough, sore throat, wounds that don't heal, pain or trouble when passing urine, general feeling of discomfort or being unwell Infusion reactions--chest pain, shortness of breath or trouble breathing, feeling faint or lightheaded Sudden eye pain or change in vision such as blurry vision, seeing halos around lights, vision loss Unusual bruising or bleeding Side effects that usually do not require medical attention (report to your care team if they continue or are bothersome): Constipation Diarrhea Fatigue Nausea Pain, tingling, or numbness in the hands or feet Swelling of the ankles, hands, or feet This list may not describe all possible side effects. Call your doctor for medical advice about side effects. You may report side effects to FDA at 1-800-FDA-1088. Where should I keep my medication? This medication is given in a hospital or clinic. It will not be stored at home. NOTE: This sheet is a summary. It may not cover all possible information. If you have  questions about this medicine, talk to your doctor, pharmacist, or health care provider.  2023 Elsevier/Gold Standard (2021-07-12 00:00:00)        To help prevent nausea and vomiting after your treatment, we encourage you to take your nausea medication as directed.  BELOW ARE SYMPTOMS THAT SHOULD BE REPORTED IMMEDIATELY: *FEVER GREATER THAN 100.4 F (38 C) OR HIGHER *CHILLS OR SWEATING *NAUSEA AND VOMITING THAT IS NOT CONTROLLED WITH YOUR NAUSEA MEDICATION *UNUSUAL SHORTNESS OF BREATH *UNUSUAL BRUISING OR BLEEDING *URINARY PROBLEMS (pain or burning when urinating, or frequent urination) *BOWEL PROBLEMS (unusual diarrhea, constipation, pain near the anus) TENDERNESS IN MOUTH AND THROAT WITH OR WITHOUT PRESENCE OF ULCERS (sore throat, sores in mouth, or a toothache) UNUSUAL RASH, SWELLING OR PAIN  UNUSUAL VAGINAL DISCHARGE OR ITCHING   Items with * indicate a potential emergency and should be followed up as soon as possible or go to the Emergency Department if any problems should occur.  Please show the CHEMOTHERAPY ALERT CARD or IMMUNOTHERAPY ALERT CARD at check-in to the Emergency Department and triage nurse.  Should you have questions after your visit or need to cancel or reschedule your appointment, please contact [facility] [phone]  and follow the prompts.  Office hours are 8:00 a.m. to 4:30 p.m. Monday - Friday. Please note that voicemails left after 4:00 p.m. may not be returned until the following business day.    We are closed weekends and major holidays. You have access to a nurse at all times for urgent questions. Please call the main number to the clinic [phone] and follow the prompts.  For any non-urgent questions, you may also contact your provider using MyChart. We now offer e-Visits for anyone 18 and older to request care online for non-urgent symptoms. For details visit [email or web address].   Also download the MyChart app! Go to the app  store, search "MyChart", open the app, select [facility], and log in with your MyChart username and password.   

## 2022-06-26 ENCOUNTER — Other Ambulatory Visit: Payer: Self-pay

## 2022-06-26 DIAGNOSIS — C9000 Multiple myeloma not having achieved remission: Secondary | ICD-10-CM

## 2022-06-27 ENCOUNTER — Inpatient Hospital Stay (HOSPITAL_BASED_OUTPATIENT_CLINIC_OR_DEPARTMENT_OTHER): Payer: Medicare Other | Admitting: Hematology

## 2022-06-27 ENCOUNTER — Inpatient Hospital Stay: Payer: Medicare Other

## 2022-06-27 VITALS — BP 119/60 | HR 90 | Temp 97.7°F | Resp 18 | Wt 176.4 lb

## 2022-06-27 DIAGNOSIS — C9000 Multiple myeloma not having achieved remission: Secondary | ICD-10-CM | POA: Diagnosis not present

## 2022-06-27 DIAGNOSIS — Z5112 Encounter for antineoplastic immunotherapy: Secondary | ICD-10-CM | POA: Diagnosis not present

## 2022-06-27 DIAGNOSIS — D472 Monoclonal gammopathy: Secondary | ICD-10-CM

## 2022-06-27 LAB — COMPREHENSIVE METABOLIC PANEL
ALT: 13 U/L (ref 0–44)
AST: 11 U/L — ABNORMAL LOW (ref 15–41)
Albumin: 3.5 g/dL (ref 3.5–5.0)
Alkaline Phosphatase: 108 U/L (ref 38–126)
Anion gap: 7 (ref 5–15)
BUN: 15 mg/dL (ref 8–23)
CO2: 25 mmol/L (ref 22–32)
Calcium: 8.4 mg/dL — ABNORMAL LOW (ref 8.9–10.3)
Chloride: 103 mmol/L (ref 98–111)
Creatinine, Ser: 1.03 mg/dL (ref 0.61–1.24)
GFR, Estimated: >60 mL/min (ref >60)
Glucose, Bld: 162 mg/dL — ABNORMAL HIGH (ref 70–99)
Potassium: 3.8 mmol/L (ref 3.5–5.1)
Sodium: 135 mmol/L (ref 135–145)
Total Bilirubin: 0.9 mg/dL (ref 0.3–1.2)
Total Protein: 6.6 g/dL (ref 6.5–8.1)

## 2022-06-27 LAB — CBC WITH DIFFERENTIAL/PLATELET
Abs Immature Granulocytes: 0.02 10*3/uL (ref 0.00–0.07)
Basophils Absolute: 0.1 10*3/uL (ref 0.0–0.1)
Basophils Relative: 1 %
Eosinophils Absolute: 0.5 10*3/uL (ref 0.0–0.5)
Eosinophils Relative: 6 %
HCT: 38.4 % — ABNORMAL LOW (ref 39.0–52.0)
Hemoglobin: 13.2 g/dL (ref 13.0–17.0)
Immature Granulocytes: 0 %
Lymphocytes Relative: 21 %
Lymphs Abs: 1.5 10*3/uL (ref 0.7–4.0)
MCH: 32.4 pg (ref 26.0–34.0)
MCHC: 34.4 g/dL (ref 30.0–36.0)
MCV: 94.3 fL (ref 80.0–100.0)
Monocytes Absolute: 1.2 10*3/uL — ABNORMAL HIGH (ref 0.1–1.0)
Monocytes Relative: 17 %
Neutro Abs: 3.8 10*3/uL (ref 1.7–7.7)
Neutrophils Relative %: 55 %
Platelets: 233 10*3/uL (ref 150–400)
RBC: 4.07 MIL/uL — ABNORMAL LOW (ref 4.22–5.81)
RDW: 13.6 % (ref 11.5–15.5)
WBC: 7.1 10*3/uL (ref 4.0–10.5)
nRBC: 0 % (ref 0.0–0.2)

## 2022-06-27 LAB — MAGNESIUM: Magnesium: 1.7 mg/dL (ref 1.7–2.4)

## 2022-06-27 MED ORDER — DARATUMUMAB-HYALURONIDASE-FIHJ 1800-30000 MG-UT/15ML ~~LOC~~ SOLN
1800.0000 mg | Freq: Once | SUBCUTANEOUS | Status: AC
  Administered 2022-06-27: 1800 mg via SUBCUTANEOUS
  Filled 2022-06-27: qty 15

## 2022-06-27 MED ORDER — CETIRIZINE HCL 10 MG PO TABS
10.0000 mg | ORAL_TABLET | Freq: Every day | ORAL | Status: DC
  Administered 2022-06-27: 10 mg via ORAL
  Filled 2022-06-27: qty 1

## 2022-06-27 MED ORDER — ACETAMINOPHEN 325 MG PO TABS
650.0000 mg | ORAL_TABLET | Freq: Once | ORAL | Status: AC
  Administered 2022-06-27: 650 mg via ORAL
  Filled 2022-06-27: qty 2

## 2022-06-27 NOTE — Progress Notes (Signed)
Patient is taking Revlimid as prescribed.  She has not missed any doses and reports no side effects at this time.    Patient has been examined by Dr. Delton Coombes. Vital signs and labs have been reviewed by MD - ANC, Creatinine, LFTs, hemoglobin, and platelets are within treatment parameters per M.D. - pt may proceed with treatment.  Primary RN and pharmacy notified.

## 2022-06-27 NOTE — Patient Instructions (Signed)
[facility] Discharge Instructions   You were seen and examined today by Dr. Katragadda.  He reviewed the results of your lab work which are normal/stable.   We will proceed with your treatment today.  Return as scheduled.    Thank you for choosing [facility] to provide your oncology and hematology care.  To afford each patient quality time with our provider, please arrive at least 15 minutes before your scheduled appointment time.   If you have a lab appointment with the Cancer Center please come in thru the Main Entrance and check in at the main information desk.  You need to re-schedule your appointment should you arrive 10 or more minutes late.  We strive to give you quality time with our providers, and arriving late affects you and other patients whose appointments are after yours.  Also, if you no show three or more times for appointments you may be dismissed from the clinic at the providers discretion.     Again, thank you for choosing [facility].  Our hope is that these requests will decrease the amount of time that you wait before being seen by our physicians.       _____________________________________________________________  Should you have questions after your visit to [facility], please contact our office at [phone] and follow the prompts.  Our office hours are 8:00 a.m. and 4:30 p.m. Monday - Friday.  Please note that voicemails left after 4:00 p.m. may not be returned until the following business day.  We are closed weekends and major holidays.  You do have access to a nurse 24-7, just call the main number to the clinic [phone] and do not press any options, hold on the line and a nurse will answer the phone.    For prescription refill requests, have your pharmacy contact our office and allow 72 hours.    Due to Covid, you will need to wear a mask upon entering  the hospital. If you do not have a mask, a mask will be given to you at the Main Entrance upon arrival. For doctor visits, patients may have 1 support person age 18 or older with them. For treatment visits, patients can not have anyone with them due to social distancing guidelines and our immunocompromised population.      

## 2022-06-27 NOTE — Progress Notes (Signed)
[facility] [address] [place] [address]    Clinic Day:  06/27/2022  Referring physician: Celene Squibb, MD  Patient Care Team: Celene Squibb, MD as PCP - General (Internal Medicine) Harl Bowie Alphonse Guild, MD as Consulting Physician (Cardiology) Derek Jack, MD as Medical Oncologist (Medical Oncology)   ASSESSMENT & PLAN:   Assessment: IgG kappa smoldering multiple myeloma: - Skeletal survey in February 2022 was negative. - Bone marrow biopsy on 06/06/2020 with 20% plasma cells.  Myeloma FISH panel with monosomy 13/deletion 13.  Chromosome analysis 46, XY (20). - No "crab" features. - PET scan from 06/29/2021: No evidence of myeloma or plasmacytoma. - Skeletal survey on 06/13/2021: No lytic lesions. - MRI of the cervical/thoracic/lumbar spine negative for bone lesions with some degenerative changes. - BMBX (02/01/2022): Hypercellular marrow with plasma cells representing 35% of all cells.  They display kappa light chain restriction.  Cytogenetics-46, XY (20). - Myeloma FISH panel: Monosomy 13. - PET scan (04/05/2022): No suspicious bony lesions or soft tissue masses.   2.  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 - No major chemical exposures.  He had personal history of melanoma of the upper lip. - Father had acute myeloid leukemia.  Mother had multiple myeloma.  Son died of acute lymphocytic leukemia  Plan: 1.  Stage II IgG kappa multiple myeloma, standard risk: - He started first dose of Revlimid 15 mg 3 weeks on/1 week off on 06/26/2022. - Reviewed myeloma panel from 05/22/2022.  M spike improved to 1.7 from 5.4.  Free light chain ratio also improved to 2.1. - Labs today shows normal LFTs, creatinine and calcium.  CBC was normal. - Continue Revlimid 15 mg 3 weeks on/1 week off, Darzalex once a month and weekly dexamethasone 20 mg.  RTC 4 weeks for follow-up.  2.  Peripheral neuropathy: -  Numbness in the feet is slightly worse.  Continue gabapentin 300 mg 4 times daily.  Will closely monitor.   3.  Prophylaxis: - Continue acyclovir 400 mg twice daily and aspirin 81 mg daily.   4.  Bone strengthening agents: - He does not have any bone lesions.  Will consider ordering DEXA scan soon.   5.  Hypomagnesemia: - Continue magnesium 1 tablet daily.  Magnesium is normal today.  No orders of the defined types were placed in this encounter.     I,[name],acting as a Education administrator for [employer], MD.,have documented all relevant documentation on the behalf of Derek Jack, MD,as directed by  Derek Jack, MD while in the presence of Derek Jack, MD.   I, Derek Jack MD, have reviewed the above documentation for accuracy and completeness, and I agree with the above.   Derek Jack, MD   3/27/20246:10 PM  CHIEF COMPLAINT:   Diagnosis: IgG kappa multiple myeloma    Cancer Staging  Multiple myeloma ([place]) Staging form: Plasma Cell Myeloma and Plasma Cell Disorders, AJCC 8th Edition - Clinical stage from 04/11/2022: RISS Stage II (Beta-2-microglobulin (mg/L): 4.1, Albumin (g/dL): 2.7, ISS: Stage II, High-risk cytogenetics: Absent, LDH: Normal) - Signed by Derek Jack, MD on 04/11/2022    Prior Therapy: none   Current Therapy:  Daratumumab, Revlimid and dexamethasone   HISTORY OF PRESENT ILLNESS:   Oncology History  Multiple myeloma ([place])  04/11/2022 Initial Diagnosis   Multiple myeloma ([place])   04/11/2022 Cancer Staging   Staging form: Plasma Cell Myeloma and Plasma Cell Disorders, AJCC  8th Edition - Clinical stage from 04/11/2022: RISS Stage II (Beta-2-microglobulin (mg/L): 4.1, Albumin (g/dL): 2.7, ISS: Stage II, High-risk cytogenetics: Absent, LDH: Normal) - Signed by Derek Jack, MD on 04/11/2022 Histopathologic type: Multiple myeloma Beta 2 microglobulin range (mg/L): 3.5 to 5.49 Albumin range (g/dL): Less  than 3.5 Cytogenetics: No abnormalities   05/01/2022 -  Chemotherapy   Patient is on Treatment Plan : MYELOMA  Daratumumab SQ + Lenalidomide + Dexamethasone (DaraRd) q28d        INTERVAL HISTORY:   [name] is a 77 y.o. male presenting to clinic today for follow up of IgG kappa multiple myeloma. He was last seen by me on 05/30/22.  Today, he states that he is doing okay overall. His appetite level is at 100%. His energy level is at 50%. He started the Revlimid last night. He notes some dizziness today. He states that the numbness in his feet has worsened. He always wears socks and/or bedroom shoes around his house.  PAST MEDICAL HISTORY:   Past Medical History: Past Medical History:  Diagnosis Date   Adverse effect of unspecified drugs, medicaments and biological substances, initial encounter    Essential (primary) hypertension    Hyperlipidemia, unspecified    Impotence of organic origin    Malignant melanoma of skin, unspecified ([place])    Malignant neoplasm of prostate ([place])    Multiple myeloma ([place])    Neuropathy    Non-pressure chronic ulcer of other part of unspecified foot with unspecified severity (HCC)    Reflux esophagitis    Restless legs syndrome    Tinea unguium    Type 2 diabetes mellitus with diabetic neuropathy, unspecified ([place])    Type 2 diabetes mellitus with other specified complication (HCC)    Unspecified atrial fibrillation ([place])     Surgical History: Past Surgical History:  Procedure Laterality Date   CATARACT EXTRACTION     HERNIA REPAIR     melanoma removal     PROSTATE SURGERY      Social History: Social History   Socioeconomic History   Marital status: Married    Spouse name: Not on file   Number of children: Not on file   Years of education: Not on file   Highest education level: Not on file  Occupational History   Not on file  Tobacco Use   Smoking status: Never   Smokeless tobacco: Never  Vaping Use   Vaping Use: Never used   Substance and Sexual Activity   Alcohol use: No    Alcohol/week: 0.0 standard drinks of alcohol   Drug use: No   Sexual activity: Not Currently  Other Topics Concern   Not on file  Social History Narrative   He worked as a Production assistant, 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   Social Determinants of Health   Financial Resource Strain: Low Risk  (05/13/2020)   Overall Financial Resource Strain (CARDIA)    Difficulty of Paying Living Expenses: Not hard at all  Food Insecurity: No Food Insecurity (05/13/2020)   Hunger Vital Sign    Worried About Running Out of Food in the Last Year: Never true    Ran Out of Food in the Last Year: Never true  Transportation Needs: No Transportation Needs (05/13/2020)   PRAPARE - Hydrologist (Medical): No    Lack of Transportation (Non-Medical): No  Physical Activity: Insufficiently Active (05/13/2020)   Exercise Vital Sign    Days of Exercise per Week: 3  days    Minutes of Exercise per Session: 20 min  Stress: No Stress Concern Present (05/13/2020)   [place]    Feeling of Stress : Not at all  Social Connections: Moderately Integrated (05/13/2020)   Social Connection and Isolation Panel [NHANES]    Frequency of Communication with Friends and Family: Twice a week    Frequency of Social Gatherings with Friends and Family: Twice a week    Attends Religious Services: 1 to 4 times per year    Active Member of [employer] or Organizations: No    Attends Archivist Meetings: Never    Marital Status: Married  Human resources officer Violence: Not At Risk (05/13/2020)   Humiliation, Afraid, Rape, and Kick questionnaire    Fear of Current or Ex-Partner: No    Emotionally Abused: No    Physically Abused: No    Sexually Abused: No    Family History: Family History  Problem Relation Age of Onset   Multiple myeloma Mother    Acute myelogenous leukemia Father     Leukemia Son        acute lymphocytic leukemia    Current Medications:  Current Outpatient Medications:    ACCU-CHEK AVIVA PLUS test strip, USE 1 STRIP TO CHECK GLUCOSE TWICE DAILY, Disp: , Rfl:    Accu-Chek FastClix Lancets MISC, Apply topically 2 (two) times daily., Disp: , Rfl:    acyclovir (ZOVIRAX) 400 MG tablet, Take 1 tablet (400 mg total) by mouth 2 (two) times daily., Disp: 60 tablet, Rfl: 6   atenolol-chlorthalidone (TENORETIC) 50-25 MG per tablet, Take 1 tablet by mouth daily. Takes 1/2 daily per Dr Luan Pulling, Disp: , Rfl:    cyanocobalamin (VITAMIN B12) 1000 MCG/ML injection, Inject 1,000 mcg into the muscle every 30 (thirty) days., Disp: , Rfl:    Daratumumab-Hyaluronidase-fihj (DARZALEX FASPRO [place]), Inject into the skin., Disp: , Rfl:    dexamethasone (DECADRON) 4 MG tablet, Take 5 tablets (20 mg total) by mouth once a week., Disp: 30 tablet, Rfl: 6   diclofenac sodium (VOLTAREN) 1 % GEL, Apply 2 g topically daily as needed., Disp: , Rfl:    gabapentin (NEURONTIN) 300 MG capsule, Take 300 mg by mouth 3 (three) times daily., Disp: , Rfl:    lenalidomide (REVLIMID) 15 MG capsule, Take 1 capsule (15 mg total) by mouth daily. 21 days on, 7 days off, Disp: 21 capsule, Rfl: 0   loperamide (IMODIUM A-D) 2 MG tablet, Take 2 mg by mouth 4 (four) times daily as needed for diarrhea or loose stools., Disp: , Rfl:    losartan (COZAAR) 50 MG tablet, Take 50 mg by mouth daily., Disp: , Rfl:    magnesium oxide (MAG-OX) 400 (240 Mg) MG tablet, Take 1 tablet (400 mg total) by mouth 2 (two) times daily., Disp: 120 tablet, Rfl: 3   metFORMIN (GLUCOPHAGE) 500 MG tablet, Take 500 mg by mouth 2 (two) times daily with a meal., Disp: , Rfl:    pantoprazole (PROTONIX) 40 MG tablet, Take 40 mg by mouth daily., Disp: , Rfl:    silver sulfADIAZINE (SILVADENE) 1 % cream, Apply topically., Disp: , Rfl:    prochlorperazine (COMPAZINE) 10 MG tablet, Take 1 tablet (10 mg total) by mouth every 6 (six) hours as  needed for nausea or vomiting., Disp: 30 tablet, Rfl: 3 No current facility-administered medications for this visit.  Facility-Administered Medications Ordered in Other Visits:    cetirizine (ZYRTEC) tablet 10 mg, 10 mg,  Oral, Daily, Derek Jack, MD, 10 mg at 06/27/22 1403   Allergies: Allergies  Allergen Reactions   Penicillins Rash    Also swelling reaction    Gadavist [Gadobutrol] Nausea Only    Patient became nauseated immediately after contrast injection.  Was better after a few minutes.     REVIEW OF SYSTEMS:   Review of Systems  Constitutional:  Positive for fatigue. Negative for appetite change, chills and fever.  HENT:   Negative for lump/mass, mouth sores, nosebleeds, sore throat and trouble swallowing.        + loss of taste  Eyes:  Negative for eye problems.  Respiratory:  Negative for cough and shortness of breath.   Cardiovascular:  Negative for chest pain, leg swelling and palpitations.  Gastrointestinal:  Negative for abdominal pain, constipation, diarrhea, nausea and vomiting.  Genitourinary:  Negative for bladder incontinence, difficulty urinating, dysuria, frequency, hematuria and nocturia.   Musculoskeletal:  Negative for arthralgias, back pain, flank pain, myalgias and neck pain.  Skin:  Negative for itching and rash.  Neurological:  Positive for dizziness and numbness (feet). Negative for headaches.  Hematological:  Does not bruise/bleed easily.  Psychiatric/Behavioral:  Negative for depression, sleep disturbance and suicidal ideas. The patient is not nervous/anxious.   All other systems reviewed and are negative.    VITALS:   Blood pressure 119/60, pulse 90, temperature 97.7 F (36.5 C), temperature source Oral, resp. rate 18, weight 176 lb 6.4 oz (80 kg), SpO2 99 %.  Wt Readings from Last 3 Encounters:  06/27/22 176 lb 6.4 oz (80 kg)  06/13/22 176 lb 9.6 oz (80.1 kg)  06/06/22 177 lb 12.8 oz (80.6 kg)    Body mass index is 26.82  kg/m.  Performance status (ECOG): 1 - Symptomatic but completely ambulatory  PHYSICAL EXAM:   Physical Exam Vitals and nursing note reviewed. Exam conducted with a chaperone present.  Constitutional:      Appearance: Normal appearance.  Cardiovascular:     Rate and Rhythm: Normal rate and regular rhythm.     Pulses: Normal pulses.     Heart sounds: Normal heart sounds.  Pulmonary:     Effort: Pulmonary effort is normal.     Breath sounds: Normal breath sounds.  Abdominal:     Palpations: Abdomen is soft. There is no hepatomegaly, splenomegaly or mass.     Tenderness: There is no abdominal tenderness.  Musculoskeletal:     Right lower leg: No edema.     Left lower leg: No edema.  Lymphadenopathy:     Cervical: No cervical adenopathy.     Right cervical: No superficial, deep or posterior cervical adenopathy.    Left cervical: No superficial, deep or posterior cervical adenopathy.     Upper Body:     Right upper body: No supraclavicular or axillary adenopathy.     Left upper body: No supraclavicular or axillary adenopathy.  Neurological:     General: No focal deficit present.     Mental Status: He is alert and oriented to person, place, and time.  Psychiatric:        Mood and Affect: Mood normal.        Behavior: Behavior normal.     LABS:      Latest Ref Rng & Units 06/27/2022   11:57 AM 06/20/2022    9:34 AM 06/13/2022   12:09 PM  CBC  WBC 4.0 - 10.5 K/uL 7.1  5.5  6.5   Hemoglobin 13.0 - 17.0 g/dL 13.2  13.9  13.0   Hematocrit 39.0 - 52.0 % 38.4  41.0  38.0   Platelets 150 - 400 K/uL 233  203  254       Latest Ref Rng & Units 06/27/2022   11:57 AM 06/20/2022    9:34 AM 06/13/2022   12:09 PM  CMP  Glucose 70 - 99 mg/dL 162  222  159   BUN 8 - 23 mg/dL 15  18  21    Creatinine 0.61 - 1.24 mg/dL 1.03  1.16  1.15   Sodium 135 - 145 mmol/L 135  135  135   Potassium 3.5 - 5.1 mmol/L 3.8  3.9  3.0   Chloride 98 - 111 mmol/L 103  98  99   CO2 22 - 32 mmol/L 25  27   26    Calcium 8.9 - 10.3 mg/dL 8.4  8.4  8.2   Total Protein 6.5 - 8.1 g/dL 6.6  7.1  6.7   Total Bilirubin 0.3 - 1.2 mg/dL 0.9  0.9  0.8   Alkaline Phos 38 - 126 U/L 108  105  99   AST 15 - 41 U/L 11  15  17    ALT 0 - 44 U/L 13  13  12       No results found for: "CEA1", "CEA" / No results found for: "CEA1", "CEA" No results found for: "PSA1" No results found for: "[id number]" No results found for: "CAN125"  Lab Results  Component Value Date   TOTALPROTELP 6.8 05/30/2022   ALBUMINELP 3.4 05/30/2022   A1GS 0.2 05/30/2022   A2GS 0.6 05/30/2022   BETS 0.7 05/30/2022   GAMS 1.9 (H) 05/30/2022   MSPIKE 1.7 (H) 05/30/2022   SPEI Comment 05/30/2022   No results found for: "TIBC", "FERRITIN", "IRONPCTSAT" Lab Results  Component Value Date   LDH 89 (L) 04/05/2022   LDH 93 (L) 01/01/2022   LDH 107 10/04/2021     STUDIES:   No results found.

## 2022-06-27 NOTE — Patient Instructions (Signed)
[name]  Discharge Instructions: Thank you for choosing [name] to provide your oncology and hematology care.  If you have a lab appointment with the [name], please come in thru the Main Entrance and check in at the main information desk.  Wear comfortable clothing and clothing appropriate for easy access to any Portacath or PICC line.   We strive to give you quality time with your provider. You may need to reschedule your appointment if you arrive late (15 or more minutes).  Arriving late affects you and other patients whose appointments are after yours.  Also, if you miss three or more appointments without notifying the office, you may be dismissed from the clinic at the provider's discretion.      For prescription refill requests, have your pharmacy contact our office and allow 72 hours for refills to be completed.    Today you received the following chemotherapy and/or immunotherapy agents [name]   To help prevent nausea and vomiting after your treatment, we encourage you to take your nausea medication as directed.  Daratumumab Injection What is this medication? DARATUMUMAB (dar a toom ue mab) treats multiple myeloma, a type of bone marrow cancer. It works by helping your immune system slow or stop the spread of cancer cells. It is a monoclonal antibody. This medicine may be used for other purposes; ask your health care provider or pharmacist if you have questions. COMMON BRAND NAME(S): DARZALEX What should I tell my care team before I take this medication? They need to know if you have any of these conditions: Hereditary fructose intolerance Infection, such as chickenpox, herpes, hepatitis B Lung or breathing disease, such as asthma, COPD An unusual or allergic reaction to daratumumab, sorbitol, other medications, foods, dyes, or preservatives Pregnant or trying to get pregnant Breastfeeding How should I use this medication? This  medication is injected into a vein. It is given by your care team in a hospital or clinic setting. Talk to your care team about the use of this medication in children. Special care may be needed. Overdosage: If you think you have taken too much of this medicine contact a poison control [name] or emergency room at once. NOTE: This medicine is only for you. Do not share this medicine with others. What if I miss a dose? Keep appointments for follow-up doses. It is important not to miss your dose. Call your care team if you are unable to keep an appointment. What may interact with this medication? Interactions have not been studied. This list may not describe all possible interactions. Give your health care provider a list of all the medicines, herbs, non-prescription drugs, or dietary supplements you use. Also tell them if you smoke, drink alcohol, or use illegal drugs. Some items may interact with your medicine. What should I watch for while using this medication? Your condition will be monitored carefully while you are receiving this medication. This medication can cause serious allergic reactions. To reduce your risk, your care team may give you other medication to take before receiving this one. Be sure to follow the directions from your care team. This medication can affect the results of blood tests to match your blood type. These changes can last for up to 6 months after the final dose. Your care team will do blood tests to match your blood type before you start treatment. Tell all of your care team that you are being treated with this medication before receiving a   blood transfusion. This medication can affect the results of some tests used to determine treatment response; extra tests may be needed to evaluate response. Talk to your care team if you wish to become pregnant or think you are pregnant. This medication can cause serious birth defects if taken during pregnancy and for 3 months after the  last dose. A reliable form of contraception is recommended while taking this medication and for 3 months after the last dose. Talk to your care team about effective forms of contraception. Do not breast-feed while taking this medication. What side effects may I notice from receiving this medication? Side effects that you should report to your care team as soon as possible: Allergic reactions--skin rash, itching, hives, swelling of the face, lips, tongue, or throat Infection--fever, chills, cough, sore throat, wounds that don't heal, pain or trouble when passing urine, general feeling of discomfort or being unwell Infusion reactions--chest pain, shortness of breath or trouble breathing, feeling faint or lightheaded Unusual bruising or bleeding Side effects that usually do not require medical attention (report to your care team if they continue or are bothersome): Constipation Diarrhea Fatigue Nausea Pain, tingling, or numbness in the hands or feet Swelling of the ankles, hands, or feet This list may not describe all possible side effects. Call your doctor for medical advice about side effects. You may report side effects to FDA at 1-800-FDA-1088. Where should I keep my medication? This medication is given in a hospital or clinic. It will not be stored at home. NOTE: This sheet is a summary. It may not cover all possible information. If you have questions about this medicine, talk to your doctor, pharmacist, or health care provider.  2023 Elsevier/Gold Standard (2021-07-12 00:00:00)   BELOW ARE SYMPTOMS THAT SHOULD BE REPORTED IMMEDIATELY: *FEVER GREATER THAN 100.4 F (38 C) OR HIGHER *CHILLS OR SWEATING *NAUSEA AND VOMITING THAT IS NOT CONTROLLED WITH YOUR NAUSEA MEDICATION *UNUSUAL SHORTNESS OF BREATH *UNUSUAL BRUISING OR BLEEDING *URINARY PROBLEMS (pain or burning when urinating, or frequent urination) *BOWEL PROBLEMS (unusual diarrhea, constipation, pain near the anus) TENDERNESS IN  MOUTH AND THROAT WITH OR WITHOUT PRESENCE OF ULCERS (sore throat, sores in mouth, or a toothache) UNUSUAL RASH, SWELLING OR PAIN  UNUSUAL VAGINAL DISCHARGE OR ITCHING   Items with * indicate a potential emergency and should be followed up as soon as possible or go to the Emergency Department if any problems should occur.  Please show the CHEMOTHERAPY ALERT CARD or IMMUNOTHERAPY ALERT CARD at check-in to the Emergency Department and triage nurse.  Should you have questions after your visit or need to cancel or reschedule your appointment, please contact [name] [phone]  and follow the prompts.  Office hours are 8:00 a.m. to 4:30 p.m. Monday - Friday. Please note that voicemails left after 4:00 p.m. may not be returned until the following business day.  We are closed weekends and major holidays. You have access to a nurse at all times for urgent questions. Please call the main number to the clinic [phone] and follow the prompts.  For any non-urgent questions, you may also contact your provider using MyChart. We now offer e-Visits for anyone 18 and older to request care online for non-urgent symptoms. For details visit [email or web address].   Also download the MyChart app! Go to the app store, search "MyChart", open the app, select [facility], and log in with your MyChart username and password.   

## 2022-06-27 NOTE — Progress Notes (Signed)
Patient presents today for Daratumumab [place] infusion. Patient is in satisfactory condition with no new complaints voiced.  Vital signs are stable.  Labs reviewed by Dr. Delton Coombes during the office visit and all labs are within treatment parameters.  We will proceed with treatment per MD orders.   Daratumumab [place] given today per MD orders. Tolerated infusion without adverse affects. Vital signs stable. No complaints at this time. Discharged from clinic ambulatory with cane in stable condition. Alert and oriented x 3. F/U with [facility] as scheduled.

## 2022-06-28 LAB — KAPPA/LAMBDA LIGHT CHAINS
Kappa free light chain: 18.1 mg/L (ref 3.3–19.4)
Kappa, lambda light chain ratio: 1.66 — ABNORMAL HIGH (ref 0.26–1.65)
Lambda free light chains: 10.9 mg/L (ref 5.7–26.3)

## 2022-07-02 LAB — PROTEIN ELECTROPHORESIS, SERUM
A/G Ratio: 1.4 (ref 0.7–1.7)
Albumin ELP: 3.5 g/dL (ref 2.9–4.4)
Alpha-1-Globulin: 0.2 g/dL (ref 0.0–0.4)
Alpha-2-Globulin: 0.6 g/dL (ref 0.4–1.0)
Beta Globulin: 0.6 g/dL — ABNORMAL LOW (ref 0.7–1.3)
Gamma Globulin: 1.1 g/dL (ref 0.4–1.8)
Globulin, Total: 2.5 g/dL (ref 2.2–3.9)
M-Spike, %: 0.7 g/dL — ABNORMAL HIGH
Total Protein ELP: 6 g/dL (ref 6.0–8.5)

## 2022-07-03 ENCOUNTER — Other Ambulatory Visit: Payer: Self-pay

## 2022-07-05 ENCOUNTER — Encounter: Payer: Self-pay | Admitting: Hematology

## 2022-07-05 NOTE — Telephone Encounter (Signed)
Wife called this morning to advise that patient has not felt well in general since Revlimid was increased from 10 mg to 15 mg.  States that his neuropathy has worsened and he has developed generalized malaise.  She reports that this morning he became weak and slumped to the floor with no evidence of losing consciousness, however he did not recall the events.  He stated that he just remembered "going down".  Told them, at that time not to take Revlimid and should be evaluated.  Called patient back and made he and wife aware, per Dr. Delton Coombes, to hold Revlimid for now and seek medical attention in the ER if he should have another passing out spell.  Verbalized understanding.

## 2022-07-09 DIAGNOSIS — M79672 Pain in left foot: Secondary | ICD-10-CM | POA: Diagnosis not present

## 2022-07-09 DIAGNOSIS — S9032XA Contusion of left foot, initial encounter: Secondary | ICD-10-CM | POA: Diagnosis not present

## 2022-07-10 ENCOUNTER — Other Ambulatory Visit: Payer: Self-pay

## 2022-07-10 DIAGNOSIS — C9000 Multiple myeloma not having achieved remission: Secondary | ICD-10-CM

## 2022-07-11 ENCOUNTER — Inpatient Hospital Stay: Payer: Medicare Other | Admitting: Hematology

## 2022-07-11 ENCOUNTER — Inpatient Hospital Stay: Payer: Medicare Other | Attending: Hematology

## 2022-07-11 ENCOUNTER — Inpatient Hospital Stay: Payer: Medicare Other

## 2022-07-11 ENCOUNTER — Other Ambulatory Visit: Payer: Self-pay

## 2022-07-11 VITALS — BP 135/84 | HR 68 | Temp 97.1°F | Resp 18 | Wt 178.2 lb

## 2022-07-11 DIAGNOSIS — C9000 Multiple myeloma not having achieved remission: Secondary | ICD-10-CM

## 2022-07-11 DIAGNOSIS — Z5112 Encounter for antineoplastic immunotherapy: Secondary | ICD-10-CM | POA: Insufficient documentation

## 2022-07-11 DIAGNOSIS — D472 Monoclonal gammopathy: Secondary | ICD-10-CM

## 2022-07-11 LAB — CBC WITH DIFFERENTIAL/PLATELET
Abs Immature Granulocytes: 0.01 10*3/uL (ref 0.00–0.07)
Basophils Absolute: 0.1 10*3/uL (ref 0.0–0.1)
Basophils Relative: 2 %
Eosinophils Absolute: 0.5 10*3/uL (ref 0.0–0.5)
Eosinophils Relative: 6 %
HCT: 39.5 % (ref 39.0–52.0)
Hemoglobin: 13.6 g/dL (ref 13.0–17.0)
Immature Granulocytes: 0 %
Lymphocytes Relative: 23 %
Lymphs Abs: 1.8 10*3/uL (ref 0.7–4.0)
MCH: 32.3 pg (ref 26.0–34.0)
MCHC: 34.4 g/dL (ref 30.0–36.0)
MCV: 93.8 fL (ref 80.0–100.0)
Monocytes Absolute: 1.2 10*3/uL — ABNORMAL HIGH (ref 0.1–1.0)
Monocytes Relative: 15 %
Neutro Abs: 4.4 10*3/uL (ref 1.7–7.7)
Neutrophils Relative %: 54 %
Platelets: 221 10*3/uL (ref 150–400)
RBC: 4.21 MIL/uL — ABNORMAL LOW (ref 4.22–5.81)
RDW: 13.5 % (ref 11.5–15.5)
WBC: 8 10*3/uL (ref 4.0–10.5)
nRBC: 0 % (ref 0.0–0.2)

## 2022-07-11 LAB — COMPREHENSIVE METABOLIC PANEL
ALT: 11 U/L (ref 0–44)
AST: 13 U/L — ABNORMAL LOW (ref 15–41)
Albumin: 3.6 g/dL (ref 3.5–5.0)
Alkaline Phosphatase: 114 U/L (ref 38–126)
Anion gap: 8 (ref 5–15)
BUN: 18 mg/dL (ref 8–23)
CO2: 25 mmol/L (ref 22–32)
Calcium: 8.4 mg/dL — ABNORMAL LOW (ref 8.9–10.3)
Chloride: 101 mmol/L (ref 98–111)
Creatinine, Ser: 0.97 mg/dL (ref 0.61–1.24)
GFR, Estimated: >60 mL/min (ref >60)
Glucose, Bld: 207 mg/dL — ABNORMAL HIGH (ref 70–99)
Potassium: 3.3 mmol/L — ABNORMAL LOW (ref 3.5–5.1)
Sodium: 134 mmol/L — ABNORMAL LOW (ref 135–145)
Total Bilirubin: 0.8 mg/dL (ref 0.3–1.2)
Total Protein: 6.7 g/dL (ref 6.5–8.1)

## 2022-07-11 LAB — MAGNESIUM: Magnesium: 1.7 mg/dL (ref 1.7–2.4)

## 2022-07-11 MED ORDER — ASPIRIN 81 MG PO TBEC
81.0000 mg | DELAYED_RELEASE_TABLET | Freq: Every day | ORAL | 12 refills | Status: AC
Start: 2022-07-11 — End: ?

## 2022-07-11 MED ORDER — DARATUMUMAB-HYALURONIDASE-FIHJ 1800-30000 MG-UT/15ML ~~LOC~~ SOLN
1800.0000 mg | Freq: Once | SUBCUTANEOUS | Status: AC
  Administered 2022-07-11: 1800 mg via SUBCUTANEOUS
  Filled 2022-07-11: qty 15

## 2022-07-11 NOTE — Patient Instructions (Signed)
[facility]  Discharge Instructions: Thank you for choosing [facility] to provide your oncology and hematology care.  If you have a lab appointment with the Cancer Center - please note that after April 8th, 2024, all labs will be drawn in the cancer center.  You do not have to check in or register with the main entrance as you have in the past but will complete your check-in in the cancer center.  Wear comfortable clothing and clothing appropriate for easy access to any Portacath or PICC line.   We strive to give you quality time with your provider. You may need to reschedule your appointment if you arrive late (15 or more minutes).  Arriving late affects you and other patients whose appointments are after yours.  Also, if you miss three or more appointments without notifying the office, you may be dismissed from the clinic at the provider's discretion.      For prescription refill requests, have your pharmacy contact our office and allow 72 hours for refills to be completed.    Today you received the following chemotherapy and/or immunotherapy agents [name] [name]   To help prevent nausea and vomiting after your treatment, we encourage you to take your nausea medication as directed.  Daratumumab Injection What is this medication? DARATUMUMAB (dar a toom ue mab) treats multiple myeloma, a type of bone marrow cancer. It works by helping your immune system slow or stop the spread of cancer cells. It is a monoclonal antibody. This medicine may be used for other purposes; ask your health care provider or pharmacist if you have questions. COMMON BRAND NAME(S): DARZALEX What should I tell my care team before I take this medication? They need to know if you have any of these conditions: Hereditary fructose intolerance Infection, such as chickenpox, herpes, hepatitis B Lung or breathing disease, such as asthma, COPD An unusual or allergic reaction to daratumumab,  sorbitol, other medications, foods, dyes, or preservatives Pregnant or trying to get pregnant Breastfeeding How should I use this medication? This medication is injected into a vein. It is given by your care team in a hospital or clinic setting. Talk to your care team about the use of this medication in children. Special care may be needed. Overdosage: If you think you have taken too much of this medicine contact a poison control center or emergency room at once. NOTE: This medicine is only for you. Do not share this medicine with others. What if I miss a dose? Keep appointments for follow-up doses. It is important not to miss your dose. Call your care team if you are unable to keep an appointment. What may interact with this medication? Interactions have not been studied. This list may not describe all possible interactions. Give your health care provider a list of all the medicines, herbs, non-prescription drugs, or dietary supplements you use. Also tell them if you smoke, drink alcohol, or use illegal drugs. Some items may interact with your medicine. What should I watch for while using this medication? Your condition will be monitored carefully while you are receiving this medication. This medication can cause serious allergic reactions. To reduce your risk, your care team may give you other medication to take before receiving this one. Be sure to follow the directions from your care team. This medication can affect the results of blood tests to match your blood type. These changes can last for up to 6 months after the final dose. Your care  team will do blood tests to match your blood type before you start treatment. Tell all of your care team that you are being treated with this medication before receiving a blood transfusion. This medication can affect the results of some tests used to determine treatment response; extra tests may be needed to evaluate response. Talk to your care team if you  wish to become pregnant or think you are pregnant. This medication can cause serious birth defects if taken during pregnancy and for 3 months after the last dose. A reliable form of contraception is recommended while taking this medication and for 3 months after the last dose. Talk to your care team about effective forms of contraception. Do not breast-feed while taking this medication. What side effects may I notice from receiving this medication? Side effects that you should report to your care team as soon as possible: Allergic reactions--skin rash, itching, hives, swelling of the face, lips, tongue, or throat Infection--fever, chills, cough, sore throat, wounds that don't heal, pain or trouble when passing urine, general feeling of discomfort or being unwell Infusion reactions--chest pain, shortness of breath or trouble breathing, feeling faint or lightheaded Unusual bruising or bleeding Side effects that usually do not require medical attention (report to your care team if they continue or are bothersome): Constipation Diarrhea Fatigue Nausea Pain, tingling, or numbness in the hands or feet Swelling of the ankles, hands, or feet This list may not describe all possible side effects. Call your doctor for medical advice about side effects. You may report side effects to FDA at 1-800-FDA-1088. Where should I keep my medication? This medication is given in a hospital or clinic. It will not be stored at home. NOTE: This sheet is a summary. It may not cover all possible information. If you have questions about this medicine, talk to your doctor, pharmacist, or health care provider.  2023 Elsevier/Gold Standard (2021-07-12 00:00:00)   BELOW ARE SYMPTOMS THAT SHOULD BE REPORTED IMMEDIATELY: *FEVER GREATER THAN 100.4 F (38 C) OR HIGHER *CHILLS OR SWEATING *NAUSEA AND VOMITING THAT IS NOT CONTROLLED WITH YOUR NAUSEA MEDICATION *UNUSUAL SHORTNESS OF BREATH *UNUSUAL BRUISING OR  BLEEDING *URINARY PROBLEMS (pain or burning when urinating, or frequent urination) *BOWEL PROBLEMS (unusual diarrhea, constipation, pain near the anus) TENDERNESS IN MOUTH AND THROAT WITH OR WITHOUT PRESENCE OF ULCERS (sore throat, sores in mouth, or a toothache) UNUSUAL RASH, SWELLING OR PAIN  UNUSUAL VAGINAL DISCHARGE OR ITCHING   Items with * indicate a potential emergency and should be followed up as soon as possible or go to the Emergency Department if any problems should occur.  Please show the CHEMOTHERAPY ALERT CARD or IMMUNOTHERAPY ALERT CARD at check-in to the Emergency Department and triage nurse.  Should you have questions after your visit or need to cancel or reschedule your appointment, please contact [facility] [phone]  and follow the prompts.  Office hours are 8:00 a.m. to 4:30 p.m. Monday - Friday. Please note that voicemails left after 4:00 p.m. may not be returned until the following business day.  We are closed weekends and major holidays. You have access to a nurse at all times for urgent questions. Please call the main number to the clinic [phone] and follow the prompts.  For any non-urgent questions, you may also contact your provider using MyChart. We now offer e-Visits for anyone 38 and older to request care online for non-urgent symptoms. For details visit [email or web address].   Also download the MyChart app! Go to  the app store, search "MyChart", open the app, select [facility], and log in with your MyChart username and password.

## 2022-07-11 NOTE — Progress Notes (Signed)
AFLAC papers completed and given to the patient in the clinic.

## 2022-07-11 NOTE — Progress Notes (Signed)
Patient presents today for [name] [name] infusion. Patient is in satisfactory condition with no new complaints voiced.  Vital signs are stable.  Labs reviewed and all labs are within treatment parameters.  We will proceed with treatment per MD orders.    Pt took pre-meds at home prior to arrival. Pt stated while working outside Saturday. Pt hurt his foot and went to urgent care to get an X-ray and it was determined he had two broken toes. Dr.K made aware and stated pt was okay to proceed with treatment per Dr.K.   Treatment given today per MD orders. Tolerated infusion without adverse affects. Vital signs stable. No complaints at this time. Discharged from clinic ambulatory with cane in stable condition. Alert and oriented x 3. F/U with [facility] as scheduled.

## 2022-07-12 ENCOUNTER — Other Ambulatory Visit: Payer: Self-pay

## 2022-07-12 DIAGNOSIS — M79672 Pain in left foot: Secondary | ICD-10-CM | POA: Diagnosis not present

## 2022-07-12 DIAGNOSIS — S92302A Fracture of unspecified metatarsal bone(s), left foot, initial encounter for closed fracture: Secondary | ICD-10-CM | POA: Diagnosis not present

## 2022-07-12 DIAGNOSIS — S92332A Displaced fracture of third metatarsal bone, left foot, initial encounter for closed fracture: Secondary | ICD-10-CM | POA: Diagnosis not present

## 2022-07-12 LAB — KAPPA/LAMBDA LIGHT CHAINS
Kappa free light chain: 22.3 mg/L — ABNORMAL HIGH (ref 3.3–19.4)
Kappa, lambda light chain ratio: 1.77 — ABNORMAL HIGH (ref 0.26–1.65)
Lambda free light chains: 12.6 mg/L (ref 5.7–26.3)

## 2022-07-12 MED ORDER — LENALIDOMIDE 15 MG PO CAPS: 15.0000 mg | ORAL_CAPSULE | Freq: Every day | ORAL | 0 refills | Status: DC

## 2022-07-12 NOTE — Telephone Encounter (Signed)
Chart reviewed. Revlimid refilled per last office note with Dr. Katragadda.  

## 2022-07-13 LAB — PROTEIN ELECTROPHORESIS, SERUM
A/G Ratio: 1.3 (ref 0.7–1.7)
Albumin ELP: 3.5 g/dL (ref 2.9–4.4)
Alpha-1-Globulin: 0.3 g/dL (ref 0.0–0.4)
Alpha-2-Globulin: 0.8 g/dL (ref 0.4–1.0)
Beta Globulin: 0.8 g/dL (ref 0.7–1.3)
Gamma Globulin: 1 g/dL (ref 0.4–1.8)
Globulin, Total: 2.8 g/dL (ref 2.2–3.9)
M-Spike, %: 0.7 g/dL — ABNORMAL HIGH
Total Protein ELP: 6.3 g/dL (ref 6.0–8.5)

## 2022-07-23 DIAGNOSIS — H524 Presbyopia: Secondary | ICD-10-CM | POA: Diagnosis not present

## 2022-07-24 ENCOUNTER — Other Ambulatory Visit: Payer: Self-pay

## 2022-07-24 DIAGNOSIS — S92302A Fracture of unspecified metatarsal bone(s), left foot, initial encounter for closed fracture: Secondary | ICD-10-CM | POA: Diagnosis not present

## 2022-07-24 DIAGNOSIS — C9000 Multiple myeloma not having achieved remission: Secondary | ICD-10-CM

## 2022-07-24 NOTE — Progress Notes (Signed)
[facility] [address], [place] [address]    Clinic Day:  07/25/2022  Referring physician: Benita Stabile, MD  Patient Care Team: Benita Stabile, MD as PCP - General (Internal Medicine) Wyline Mood Dorothe Pea, MD as Consulting Physician (Cardiology) Doreatha Massed, MD as Medical Oncologist (Medical Oncology)   ASSESSMENT & PLAN:   Assessment: IgG kappa smoldering multiple myeloma: - Skeletal survey in February 2022 was negative. - Bone marrow biopsy on 06/06/2020 with 20% plasma cells.  Myeloma FISH panel with monosomy 13/deletion 13.  Chromosome analysis 46, XY (20). - No "crab" features. - PET scan from 06/29/2021: No evidence of myeloma or plasmacytoma. - Skeletal survey on 06/13/2021: No lytic lesions. - MRI of the cervical/thoracic/lumbar spine negative for bone lesions with some degenerative changes. - BMBX (02/01/2022): Hypercellular marrow with plasma cells representing 35% of all cells.  They display kappa light chain restriction.  Cytogenetics-46, XY (20). - Myeloma FISH panel: Monosomy 13. - PET scan (04/05/2022): No suspicious bony lesions or soft tissue masses. - Daratumumab, Revlimid and dexamethasone started on 05/01/2022, could not tolerate Revlimid 15 mg dose as he had near syncope, Revlimid 15 mg stopped on 07/05/2022, dose decreased to 10 mg 3 weeks on/1 week off   2.  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 - No major chemical exposures.  He had personal history of melanoma of the upper lip. - Father had acute myeloid leukemia.  Mother had multiple myeloma.  Son died of acute lymphocytic leukemia    Plan: 1.  Stage II IgG kappa multiple myeloma, standard risk: - 3 days after starting Revlimid 15 mg dose, he felt neuropathy in the feet got worse.  About 8 days after increased dose Revlimid, he had an episode of near syncope. - We stopped Revlimid 15 mg on 07/05/2022. - We reviewed myeloma  panel from 07/11/2022.  M spike is stable at 0.7 g.  Kappa light chains slightly went up to 22.3 from 18.  Ratio is 1.77, from 1.66. - Labs today: Normal LFTs, creatinine and CBC.  Proceed with cycle 4-day 1 today.  Will start Revlimid 10 mg 3 weeks on/1 week off.  Continue dexamethasone 20 mg weekly.  He is currently receiving daratumumab every 2 weeks.  RTC 4 weeks for follow-up.  2.  Peripheral neuropathy: - Since higher dose Revlimid was held, numbness in the feet has come back to his baseline. - Continue gabapentin 300 mg 4 times daily.   3.  Prophylaxis: - Continue acyclovir 4 mg twice daily and aspirin 81 mg daily.   4.  Bone strengthening agents: - He does not have any bone lesions.  Will consider ordering DEXA scan.   5.  Hypomagnesemia: - Continue magnesium 1 tablet daily.  Magnesium is normal.  No orders of the defined types were placed in this encounter.     I,Katie Daubenspeck,acting as a Neurosurgeon for Doreatha Massed, MD.,have documented all relevant documentation on the behalf of Doreatha Massed, MD,as directed by  Doreatha Massed, MD while in the presence of Doreatha Massed, MD.   I, Doreatha Massed MD, have reviewed the above documentation for accuracy and completeness, and I agree with the above.   Doreatha Massed, MD   4/24/20245:21 PM  CHIEF COMPLAINT:   Diagnosis:  IgG kappa multiple myeloma    Cancer Staging  Multiple myeloma Staging form: Plasma Cell Myeloma and Plasma Cell Disorders, AJCC 8th Edition - Clinical  stage from 04/11/2022: RISS Stage II (Beta-2-microglobulin (mg/L): 4.1, Albumin (g/dL): 2.7, ISS: Stage II, High-risk cytogenetics: Absent, LDH: Normal) - Signed by Doreatha Massed, MD on 04/11/2022    Prior Therapy: none  Current Therapy:  Daratumumab, Revlimid and dexamethasone    HISTORY OF PRESENT ILLNESS:   Oncology History  Multiple myeloma  04/11/2022 Initial Diagnosis   Multiple myeloma (HCC)   04/11/2022  Cancer Staging   Staging form: Plasma Cell Myeloma and Plasma Cell Disorders, AJCC 8th Edition - Clinical stage from 04/11/2022: RISS Stage II (Beta-2-microglobulin (mg/L): 4.1, Albumin (g/dL): 2.7, ISS: Stage II, High-risk cytogenetics: Absent, LDH: Normal) - Signed by Doreatha Massed, MD on 04/11/2022 Histopathologic type: Multiple myeloma Beta 2 microglobulin range (mg/L): 3.5 to 5.49 Albumin range (g/dL): Less than 3.5 Cytogenetics: No abnormalities   05/01/2022 -  Chemotherapy   Patient is on Treatment Plan : MYELOMA  Daratumumab SQ + Lenalidomide + Dexamethasone (DaraRd) q28d        INTERVAL HISTORY:   [name] [name] is a 77 y.o. male presenting to clinic today for follow up of IgG kappa multiple myeloma. He was last seen by me on 06/27/22.  Today, he states that he is doing well overall. His appetite level is at 75%. His energy level is at 25%.  PAST MEDICAL HISTORY:   Past Medical History: Past Medical History:  Diagnosis Date   Adverse effect of unspecified drugs, medicaments and biological substances, initial encounter    Essential (primary) hypertension    Hyperlipidemia, unspecified    Impotence of organic origin    Malignant melanoma of skin, unspecified (HCC)    Malignant neoplasm of prostate (HCC)    Multiple myeloma (HCC)    Neuropathy    Non-pressure chronic ulcer of other part of unspecified foot with unspecified severity (HCC)    Reflux esophagitis    Restless legs syndrome    Tinea unguium    Type 2 diabetes mellitus with diabetic neuropathy, unspecified (HCC)    Type 2 diabetes mellitus with other specified complication (HCC)    Unspecified atrial fibrillation (HCC)     Surgical History: Past Surgical History:  Procedure Laterality Date   CATARACT EXTRACTION     HERNIA REPAIR     melanoma removal     PROSTATE SURGERY      Social History: Social History   Socioeconomic History   Marital status: Married    Spouse name: Not on file   Number of  children: Not on file   Years of education: Not on file   Highest education level: Not on file  Occupational History   Not on file  Tobacco Use   Smoking status: Never   Smokeless tobacco: Never  Vaping Use   Vaping Use: Never used  Substance and Sexual Activity   Alcohol use: No    Alcohol/week: 0.0 standard drinks of alcohol   Drug use: No   Sexual activity: Not Currently  Other Topics Concern   Not on file  Social History Narrative   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   Social Determinants of Health   Financial Resource Strain: Low Risk  (05/13/2020)   Overall Financial Resource Strain (CARDIA)    Difficulty of Paying Living Expenses: Not hard at all  Food Insecurity: No Food Insecurity (05/13/2020)   Hunger Vital Sign    Worried About Running Out of Food in the Last Year: Never true    Ran Out of Food in the Last  Year: Never true  Transportation Needs: No Transportation Needs (05/13/2020)   PRAPARE - Administrator, Civil Service (Medical): No    Lack of Transportation (Non-Medical): No  Physical Activity: Insufficiently Active (05/13/2020)   Exercise Vital Sign    Days of Exercise per Week: 3 days    Minutes of Exercise per Session: 20 min  Stress: No Stress Concern Present (05/13/2020)   [employer] of Occupational Health - Occupational Stress Questionnaire    Feeling of Stress : Not at all  Social Connections: Moderately Integrated (05/13/2020)   Social Connection and Isolation Panel [NHANES]    Frequency of Communication with Friends and Family: Twice a week    Frequency of Social Gatherings with Friends and Family: Twice a week    Attends Religious Services: 1 to 4 times per year    Active Member of [employer] or Organizations: No    Attends Banker Meetings: Never    Marital Status: Married  Catering manager Violence: Not At Risk (05/13/2020)   Humiliation, Afraid, Rape, and Kick questionnaire    Fear  of Current or Ex-Partner: No    Emotionally Abused: No    Physically Abused: No    Sexually Abused: No    Family History: Family History  Problem Relation Age of Onset   Multiple myeloma Mother    Acute myelogenous leukemia Father    Leukemia Son        acute lymphocytic leukemia    Current Medications:  Current Outpatient Medications:    ACCU-CHEK AVIVA PLUS test strip, USE 1 STRIP TO CHECK GLUCOSE TWICE DAILY, Disp: , Rfl:    Accu-Chek FastClix Lancets MISC, Apply topically 2 (two) times daily., Disp: , Rfl:    acyclovir (ZOVIRAX) 400 MG tablet, Take 1 tablet (400 mg total) by mouth 2 (two) times daily., Disp: 60 tablet, Rfl: 6   aspirin EC 81 MG tablet, Take 1 tablet (81 mg total) by mouth daily. Swallow whole., Disp: 30 tablet, Rfl: 12   atenolol-chlorthalidone (TENORETIC) 50-25 MG per tablet, Take 1 tablet by mouth daily. Takes 1/2 daily per Dr Juanetta Gosling, Disp: , Rfl:    cyanocobalamin (VITAMIN B12) 1000 MCG/ML injection, Inject 1,000 mcg into the muscle every 30 (thirty) days., Disp: , Rfl:    Daratumumab-Hyaluronidase-fihj (DARZALEX FASPRO Arco), Inject into the skin., Disp: , Rfl:    dexamethasone (DECADRON) 4 MG tablet, Take 5 tablets (20 mg total) by mouth once a week., Disp: 30 tablet, Rfl: 6   diclofenac sodium (VOLTAREN) 1 % GEL, Apply 2 g topically daily as needed., Disp: , Rfl:    gabapentin (NEURONTIN) 300 MG capsule, Take 300 mg by mouth 3 (three) times daily., Disp: , Rfl:    loperamide (IMODIUM A-D) 2 MG tablet, Take 2 mg by mouth 4 (four) times daily as needed for diarrhea or loose stools., Disp: , Rfl:    losartan (COZAAR) 50 MG tablet, Take 50 mg by mouth daily., Disp: , Rfl:    magnesium oxide (MAG-OX) 400 (240 Mg) MG tablet, Take 1 tablet (400 mg total) by mouth 2 (two) times daily., Disp: 120 tablet, Rfl: 3   metFORMIN (GLUCOPHAGE) 500 MG tablet, Take 500 mg by mouth 2 (two) times daily with a meal., Disp: , Rfl:    pantoprazole (PROTONIX) 40 MG tablet, Take 40 mg  by mouth daily., Disp: , Rfl:    silver sulfADIAZINE (SILVADENE) 1 % cream, Apply topically., Disp: , Rfl:    lenalidomide (REVLIMID) 10 MG capsule,  Take 1 capsule (10 mg total) by mouth daily. 21 days on, 7 days off, Disp: 21 capsule, Rfl: 0   prochlorperazine (COMPAZINE) 10 MG tablet, Take 1 tablet (10 mg total) by mouth every 6 (six) hours as needed for nausea or vomiting. (Patient not taking: Reported on 07/25/2022), Disp: 30 tablet, Rfl: 3   Allergies: Allergies  Allergen Reactions   Penicillins Rash    Also swelling reaction    Gadavist [Gadobutrol] Nausea Only    Patient became nauseated immediately after contrast injection.  Was better after a few minutes.     REVIEW OF SYSTEMS:   Review of Systems  Constitutional:  Negative for chills, fatigue and fever.  HENT:   Negative for lump/mass, mouth sores, nosebleeds, sore throat and trouble swallowing.   Eyes:  Negative for eye problems.  Respiratory:  Negative for cough and shortness of breath.   Cardiovascular:  Positive for palpitations. Negative for chest pain and leg swelling.  Gastrointestinal:  Negative for abdominal pain, constipation, diarrhea, nausea and vomiting.  Genitourinary:  Negative for bladder incontinence, difficulty urinating, dysuria, frequency, hematuria and nocturia.   Musculoskeletal:  Negative for arthralgias, back pain, flank pain, myalgias and neck pain.  Skin:  Negative for itching and rash.  Neurological:  Positive for dizziness and numbness. Negative for headaches.  Hematological:  Does not bruise/bleed easily.  Psychiatric/Behavioral:  Negative for depression, sleep disturbance and suicidal ideas. The patient is not nervous/anxious.   All other systems reviewed and are negative.    VITALS:   Blood pressure 113/79, pulse 78, temperature (!) 96.3 F (35.7 C), temperature source Tympanic, resp. rate 16, weight 176 lb 4.8 oz (80 kg), SpO2 98 %.  Wt Readings from Last 3 Encounters:  07/25/22 176 lb 4.8  oz (80 kg)  07/11/22 178 lb 3.2 oz (80.8 kg)  06/27/22 176 lb 6.4 oz (80 kg)    Body mass index is 26.81 kg/m.  Performance status (ECOG): 1 - Symptomatic but completely ambulatory  PHYSICAL EXAM:   Physical Exam Vitals and nursing note reviewed. Exam conducted with a chaperone present.  Constitutional:      Appearance: Normal appearance.  Cardiovascular:     Rate and Rhythm: Normal rate and regular rhythm.     Pulses: Normal pulses.     Heart sounds: Normal heart sounds.  Pulmonary:     Effort: Pulmonary effort is normal.     Breath sounds: Normal breath sounds.  Abdominal:     Palpations: Abdomen is soft. There is no hepatomegaly, splenomegaly or mass.     Tenderness: There is no abdominal tenderness.  Musculoskeletal:     Right lower leg: No edema.     Left lower leg: No edema.  Lymphadenopathy:     Cervical: No cervical adenopathy.     Right cervical: No superficial, deep or posterior cervical adenopathy.    Left cervical: No superficial, deep or posterior cervical adenopathy.     Upper Body:     Right upper body: No supraclavicular or axillary adenopathy.     Left upper body: No supraclavicular or axillary adenopathy.  Neurological:     General: No focal deficit present.     Mental Status: He is alert and oriented to person, place, and time.  Psychiatric:        Mood and Affect: Mood normal.        Behavior: Behavior normal.     LABS:      Latest Ref Rng & Units 07/25/2022  12:19 PM 07/11/2022   12:22 PM 06/27/2022   11:57 AM  CBC  WBC 4.0 - 10.5 K/uL 8.0  8.0  7.1   Hemoglobin 13.0 - 17.0 g/dL 40.9  81.1  91.4   Hematocrit 39.0 - 52.0 % 41.1  39.5  38.4   Platelets 150 - 400 K/uL 304  221  233       Latest Ref Rng & Units 07/25/2022   12:19 PM 07/11/2022   12:22 PM 06/27/2022   11:57 AM  CMP  Glucose 70 - 99 mg/dL 782  956  213   BUN 8 - 23 mg/dL Creatinine 0.61 - 1.24 mg/dL 0.86  5.78  4.69   Sodium 135 - 145 mmol/L 137  134  135    Potassium 3.5 - 5.1 mmol/L 3.4  3.3  3.8   Chloride 98 - 111 mmol/L 101  101  103   CO2 22 - 32 mmol/L Calcium 8.9 - 10.3 mg/dL 9.0  8.4  8.4   Total Protein 6.5 - 8.1 g/dL 6.9  6.7  6.6   Total Bilirubin 0.3 - 1.2 mg/dL 0.9  0.8  0.9   Alkaline Phos 38 - 126 U/L 133  114  108   AST 15 - 41 U/L ALT 0 - 44 U/L No results found for: "CEA1", "CEA" / No results found for: "CEA1", "CEA" No results found for: "PSA1" No results found for: "GEX528" No results found for: "CAN125"  Lab Results  Component Value Date   TOTALPROTELP 6.3 07/11/2022   ALBUMINELP 3.5 07/11/2022   A1GS 0.3 07/11/2022   A2GS 0.8 07/11/2022   BETS 0.8 07/11/2022   GAMS 1.0 07/11/2022   MSPIKE 0.7 (H) 07/11/2022   SPEI Comment 07/11/2022   No results found for: "TIBC", "FERRITIN", "IRONPCTSAT" Lab Results  Component Value Date   LDH 89 (L) 04/05/2022   LDH 93 (L) 01/01/2022   LDH 107 10/04/2021     STUDIES:   No results found.

## 2022-07-25 ENCOUNTER — Inpatient Hospital Stay: Payer: Medicare Other

## 2022-07-25 ENCOUNTER — Inpatient Hospital Stay: Payer: Medicare Other | Admitting: Hematology

## 2022-07-25 DIAGNOSIS — Z5112 Encounter for antineoplastic immunotherapy: Secondary | ICD-10-CM | POA: Diagnosis not present

## 2022-07-25 DIAGNOSIS — C9000 Multiple myeloma not having achieved remission: Secondary | ICD-10-CM | POA: Diagnosis not present

## 2022-07-25 LAB — CBC WITH DIFFERENTIAL/PLATELET
Abs Immature Granulocytes: 0.03 10*3/uL (ref 0.00–0.07)
Basophils Absolute: 0.1 10*3/uL (ref 0.0–0.1)
Basophils Relative: 1 %
Eosinophils Absolute: 0.3 10*3/uL (ref 0.0–0.5)
Eosinophils Relative: 4 %
HCT: 41.1 % (ref 39.0–52.0)
Hemoglobin: 14.1 g/dL (ref 13.0–17.0)
Immature Granulocytes: 0 %
Lymphocytes Relative: 23 %
Lymphs Abs: 1.8 10*3/uL (ref 0.7–4.0)
MCH: 31.4 pg (ref 26.0–34.0)
MCHC: 34.3 g/dL (ref 30.0–36.0)
MCV: 91.5 fL (ref 80.0–100.0)
Monocytes Absolute: 0.8 10*3/uL (ref 0.1–1.0)
Monocytes Relative: 10 %
Neutro Abs: 5 10*3/uL (ref 1.7–7.7)
Neutrophils Relative %: 62 %
Platelets: 304 10*3/uL (ref 150–400)
RBC: 4.49 MIL/uL (ref 4.22–5.81)
RDW: 14.2 % (ref 11.5–15.5)
WBC: 8 10*3/uL (ref 4.0–10.5)
nRBC: 0 % (ref 0.0–0.2)

## 2022-07-25 LAB — COMPREHENSIVE METABOLIC PANEL
ALT: 12 U/L (ref 0–44)
AST: 15 U/L (ref 15–41)
Albumin: 3.7 g/dL (ref 3.5–5.0)
Alkaline Phosphatase: 133 U/L — ABNORMAL HIGH (ref 38–126)
Anion gap: 10 (ref 5–15)
BUN: 16 mg/dL (ref 8–23)
CO2: 26 mmol/L (ref 22–32)
Calcium: 9 mg/dL (ref 8.9–10.3)
Chloride: 101 mmol/L (ref 98–111)
Creatinine, Ser: 1.06 mg/dL (ref 0.61–1.24)
GFR, Estimated: >60 mL/min (ref >60)
Glucose, Bld: 222 mg/dL — ABNORMAL HIGH (ref 70–99)
Potassium: 3.4 mmol/L — ABNORMAL LOW (ref 3.5–5.1)
Sodium: 137 mmol/L (ref 135–145)
Total Bilirubin: 0.9 mg/dL (ref 0.3–1.2)
Total Protein: 6.9 g/dL (ref 6.5–8.1)

## 2022-07-25 LAB — MAGNESIUM: Magnesium: 1.7 mg/dL (ref 1.7–2.4)

## 2022-07-25 MED ORDER — DARATUMUMAB-HYALURONIDASE-FIHJ 1800-30000 MG-UT/15ML ~~LOC~~ SOLN
1800.0000 mg | Freq: Once | SUBCUTANEOUS | Status: AC
  Administered 2022-07-25: 1800 mg via SUBCUTANEOUS
  Filled 2022-07-25: qty 15

## 2022-07-25 MED ORDER — LENALIDOMIDE 10 MG PO CAPS: 10.0000 mg | ORAL_CAPSULE | Freq: Every day | ORAL | 0 refills | Status: DC

## 2022-07-25 NOTE — Patient Instructions (Signed)
[facility]  Discharge Instructions  You were seen and examined today by Dr. Ellin Saba.  Proceed with treatment today. Your labs are stable.  Dr. Ellin Saba will decrease your Revlimid back to  due to your fainting spell.  Follow-up as scheduled.  Thank you for choosing  [facility] to provide your oncology and hematology care.   To afford each patient quality time with our provider, please arrive at least 15 minutes before your scheduled appointment time. You may need to reschedule your appointment if you arrive late (10 or more minutes). Arriving late affects you and other patients whose appointments are after yours.  Also, if you miss three or more appointments without notifying the office, you may be dismissed from the clinic at the provider's discretion.    Again, thank you for choosing [facility]  Our hope is that these requests will decrease the amount of time that you wait before being seen by our physicians.   If you have a lab appointment with the Cancer Center - please note that after April 8th, all labs will be drawn in the cancer center.  You do not have to check in or register with the main entrance as you have in the past but will complete your check-in at the cancer center.            _____________________________________________________________  Should you have questions after your visit to [facility], please contact our office at [phone] and follow the prompts.  Our office hours are 8:00 a.m. to 4:30 p.m. Monday - Thursday and 8:00 a.m. to 2:30 p.m. Friday.  Please note that voicemails left after 4:00 p.m. may not be returned until the following business day.  We are closed weekends and all major holidays.  You do have access to a nurse 24-7, just call the main number to the clinic [phone] and do not press any options, hold on the line and a nurse will answer the phone.    For  prescription refill requests, have your pharmacy contact our office and allow 72 hours.    Masks are no longer required in the cancer centers. If you would like for your care team to wear a mask while they are taking care of you, please let them know. You may have one support person who is at least 78 years old accompany you for your appointments.

## 2022-07-25 NOTE — Progress Notes (Signed)
Patient is not taking Revlimid as prescribed due to fainting spell. Revlimid will be decreased to  per Dr. Ellin Saba.  Patient has been assessed, vital signs and labs have been reviewed by Dr. Ellin Saba. ANC, Creatinine, LFTs, and Platelets are within treatment parameters per Dr. Ellin Saba. The patient is good to proceed with treatment at this time. Primary RN and pharmacy aware.

## 2022-07-25 NOTE — Patient Instructions (Signed)
[facility]  Discharge Instructions: Thank you for choosing [facility] to provide your oncology and hematology care.  If you have a lab appointment with the Cancer Center - please note that after April 8th, 2024, all labs will be drawn in the cancer center.  You do not have to check in or register with the main entrance as you have in the past but will complete your check-in in the cancer center.  Wear comfortable clothing and clothing appropriate for easy access to any Portacath or PICC line.   We strive to give you quality time with your provider. You may need to reschedule your appointment if you arrive late (15 or more minutes).  Arriving late affects you and other patients whose appointments are after yours.  Also, if you miss three or more appointments without notifying the office, you may be dismissed from the clinic at the provider's discretion.      For prescription refill requests, have your pharmacy contact our office and allow 72 hours for refills to be completed.    Today you received the following chemotherapy and/or immunotherapy agents Daratumumab      To help prevent nausea and vomiting after your treatment, we encourage you to take your nausea medication as directed.  BELOW ARE SYMPTOMS THAT SHOULD BE REPORTED IMMEDIATELY: *FEVER GREATER THAN 100.4 F (38 C) OR HIGHER *CHILLS OR SWEATING *NAUSEA AND VOMITING THAT IS NOT CONTROLLED WITH YOUR NAUSEA MEDICATION *UNUSUAL SHORTNESS OF BREATH *UNUSUAL BRUISING OR BLEEDING *URINARY PROBLEMS (pain or burning when urinating, or frequent urination) *BOWEL PROBLEMS (unusual diarrhea, constipation, pain near the anus) TENDERNESS IN MOUTH AND THROAT WITH OR WITHOUT PRESENCE OF ULCERS (sore throat, sores in mouth, or a toothache) UNUSUAL RASH, SWELLING OR PAIN  UNUSUAL VAGINAL DISCHARGE OR ITCHING   Items with * indicate a potential emergency and should be followed up as soon as possible or go to the  Emergency Department if any problems should occur.  Please show the CHEMOTHERAPY ALERT CARD or IMMUNOTHERAPY ALERT CARD at check-in to the Emergency Department and triage nurse.  Should you have questions after your visit or need to cancel or reschedule your appointment, please contact [facility] [phone]  and follow the prompts.  Office hours are 8:00 a.m. to 4:30 p.m. Monday - Friday. Please note that voicemails left after 4:00 p.m. may not be returned until the following business day.  We are closed weekends and major holidays. You have access to a nurse at all times for urgent questions. Please call the main number to the clinic [phone] and follow the prompts.  For any non-urgent questions, you may also contact your provider using MyChart. We now offer e-Visits for anyone 48 and older to request care online for non-urgent symptoms. For details visit [email or web address].   Also download the MyChart app! Go to the app store, search "MyChart", open the app, select [facility], and log in with your MyChart username and password.

## 2022-07-25 NOTE — Progress Notes (Signed)
Patient presents today for Daratumumab injection pre providers order.  Vital signs and labs within parameters for treatment.  Patient has no new complaints at this time.  Patient states that he took premedications at home prior to appointment time.  Blood glucose was 222, per standing orders patient should get 10 Units of insulin.  Patient states that he will check sugar at home and that he has never taken insulin before and is afraid that it will drop too low.  MD notified that patient refused insulin and will recheck sugar at home.  Stable during administration without incident; injection site WNL; see MAR for injection details.  Patient tolerated procedure well and without incident.  No questions or complaints noted at this time. Vital signs stable. Discharge from clinic ambulatory in stable condition.  Alert and oriented X 3.  Follow up with [facility] as scheduled.

## 2022-07-27 LAB — PROTEIN ELECTROPHORESIS, SERUM
A/G Ratio: 1.2 (ref 0.7–1.7)
Albumin ELP: 3.5 g/dL (ref 2.9–4.4)
Alpha-1-Globulin: 0.2 g/dL (ref 0.0–0.4)
Alpha-2-Globulin: 0.8 g/dL (ref 0.4–1.0)
Beta Globulin: 0.9 g/dL (ref 0.7–1.3)
Gamma Globulin: 1 g/dL (ref 0.4–1.8)
Globulin, Total: 2.9 g/dL (ref 2.2–3.9)
M-Spike, %: 0.6 g/dL — ABNORMAL HIGH
Total Protein ELP: 6.4 g/dL (ref 6.0–8.5)

## 2022-07-27 LAB — KAPPA/LAMBDA LIGHT CHAINS
Kappa free light chain: 17.5 mg/L (ref 3.3–19.4)
Kappa, lambda light chain ratio: 1.9 — ABNORMAL HIGH (ref 0.26–1.65)
Lambda free light chains: 9.2 mg/L (ref 5.7–26.3)

## 2022-08-01 ENCOUNTER — Encounter: Payer: Self-pay | Admitting: *Deleted

## 2022-08-07 ENCOUNTER — Other Ambulatory Visit: Payer: Self-pay

## 2022-08-07 DIAGNOSIS — C9000 Multiple myeloma not having achieved remission: Secondary | ICD-10-CM

## 2022-08-08 ENCOUNTER — Ambulatory Visit: Payer: Medicare Other | Admitting: Hematology

## 2022-08-08 ENCOUNTER — Inpatient Hospital Stay: Payer: Medicare Other | Attending: Hematology

## 2022-08-08 ENCOUNTER — Inpatient Hospital Stay: Payer: Medicare Other

## 2022-08-08 VITALS — BP 124/84 | HR 56 | Temp 96.8°F | Resp 16

## 2022-08-08 DIAGNOSIS — C9000 Multiple myeloma not having achieved remission: Secondary | ICD-10-CM | POA: Diagnosis not present

## 2022-08-08 DIAGNOSIS — Z5112 Encounter for antineoplastic immunotherapy: Secondary | ICD-10-CM | POA: Diagnosis not present

## 2022-08-08 LAB — COMPREHENSIVE METABOLIC PANEL
ALT: 14 U/L (ref 0–44)
AST: 14 U/L — ABNORMAL LOW (ref 15–41)
Albumin: 3.6 g/dL (ref 3.5–5.0)
Alkaline Phosphatase: 114 U/L (ref 38–126)
Anion gap: 10 (ref 5–15)
BUN: 17 mg/dL (ref 8–23)
CO2: 27 mmol/L (ref 22–32)
Calcium: 8.7 mg/dL — ABNORMAL LOW (ref 8.9–10.3)
Chloride: 99 mmol/L (ref 98–111)
Creatinine, Ser: 1.04 mg/dL (ref 0.61–1.24)
GFR, Estimated: >60 mL/min (ref >60)
Glucose, Bld: 229 mg/dL — ABNORMAL HIGH (ref 70–99)
Potassium: 4.2 mmol/L (ref 3.5–5.1)
Sodium: 136 mmol/L (ref 135–145)
Total Bilirubin: 0.7 mg/dL (ref 0.3–1.2)
Total Protein: 6.6 g/dL (ref 6.5–8.1)

## 2022-08-08 LAB — CBC WITH DIFFERENTIAL/PLATELET
Abs Immature Granulocytes: 0.04 10*3/uL (ref 0.00–0.07)
Basophils Absolute: 0 10*3/uL (ref 0.0–0.1)
Basophils Relative: 0 %
Eosinophils Absolute: 0.7 10*3/uL — ABNORMAL HIGH (ref 0.0–0.5)
Eosinophils Relative: 8 %
HCT: 40.2 % (ref 39.0–52.0)
Hemoglobin: 13.7 g/dL (ref 13.0–17.0)
Immature Granulocytes: 1 %
Lymphocytes Relative: 18 %
Lymphs Abs: 1.6 10*3/uL (ref 0.7–4.0)
MCH: 31.4 pg (ref 26.0–34.0)
MCHC: 34.1 g/dL (ref 30.0–36.0)
MCV: 92 fL (ref 80.0–100.0)
Monocytes Absolute: 1.2 10*3/uL — ABNORMAL HIGH (ref 0.1–1.0)
Monocytes Relative: 14 %
Neutro Abs: 5.1 10*3/uL (ref 1.7–7.7)
Neutrophils Relative %: 59 %
Platelets: 217 10*3/uL (ref 150–400)
RBC: 4.37 MIL/uL (ref 4.22–5.81)
RDW: 14.6 % (ref 11.5–15.5)
WBC: 8.6 10*3/uL (ref 4.0–10.5)
nRBC: 0 % (ref 0.0–0.2)

## 2022-08-08 LAB — MAGNESIUM: Magnesium: 1.6 mg/dL — ABNORMAL LOW (ref 1.7–2.4)

## 2022-08-08 MED ORDER — DARATUMUMAB-HYALURONIDASE-FIHJ 1800-30000 MG-UT/15ML ~~LOC~~ SOLN
1800.0000 mg | Freq: Once | SUBCUTANEOUS | Status: AC
  Administered 2022-08-08: 1800 mg via SUBCUTANEOUS
  Filled 2022-08-08: qty 15

## 2022-08-08 NOTE — Patient Instructions (Signed)
[facility] [place]  Discharge Instructions: Thank you for choosing [facility] to provide your oncology and hematology care.  If you have a lab appointment with the Cancer Center - please note that after April 8th, 2024, all labs will be drawn in the cancer center.  You do not have to check in or register with the main entrance as you have in the past but will complete your check-in in the cancer center.  Wear comfortable clothing and clothing appropriate for easy access to any Portacath or PICC line.   We strive to give you quality time with your provider. You may need to reschedule your appointment if you arrive late (15 or more minutes).  Arriving late affects you and other patients whose appointments are after yours.  Also, if you miss three or more appointments without notifying the office, you may be dismissed from the clinic at the provider's discretion.      For prescription refill requests, have your pharmacy contact our office and allow 72 hours for refills to be completed.    Today you received the following chemotherapy and/or immunotherapy agents [place] [place]   To help prevent nausea and vomiting after your treatment, we encourage you to take your nausea medication as directed.  Daratumumab Injection What is this medication? DARATUMUMAB (dar a toom ue mab) treats multiple myeloma, a type of bone marrow cancer. It works by helping your immune system slow or stop the spread of cancer cells. It is a monoclonal antibody. This medicine may be used for other purposes; ask your health care provider or pharmacist if you have questions. COMMON BRAND NAME(S): DARZALEX What should I tell my care team before I take this medication? They need to know if you have any of these conditions: Hereditary fructose intolerance Infection, such as chickenpox, herpes, hepatitis B Lung or breathing disease, such as asthma, COPD An unusual or allergic reaction to daratumumab,  sorbitol, other medications, foods, dyes, or preservatives Pregnant or trying to get pregnant Breastfeeding How should I use this medication? This medication is injected into a vein. It is given by your care team in a hospital or clinic setting. Talk to your care team about the use of this medication in children. Special care may be needed. Overdosage: If you think you have taken too much of this medicine contact a poison control center or emergency room at once. NOTE: This medicine is only for you. Do not share this medicine with others. What if I miss a dose? Keep appointments for follow-up doses. It is important not to miss your dose. Call your care team if you are unable to keep an appointment. What may interact with this medication? Interactions have not been studied. This list may not describe all possible interactions. Give your health care provider a list of all the medicines, herbs, non-prescription drugs, or dietary supplements you use. Also tell them if you smoke, drink alcohol, or use illegal drugs. Some items may interact with your medicine. What should I watch for while using this medication? Your condition will be monitored carefully while you are receiving this medication. This medication can cause serious allergic reactions. To reduce your risk, your care team may give you other medication to take before receiving this one. Be sure to follow the directions from your care team. This medication can affect the results of blood tests to match your blood type. These changes can last for up to 6 months after the final dose. Your care   team will do blood tests to match your blood type before you start treatment. Tell all of your care team that you are being treated with this medication before receiving a blood transfusion. This medication can affect the results of some tests used to determine treatment response; extra tests may be needed to evaluate response. Talk to your care team if you  wish to become pregnant or think you are pregnant. This medication can cause serious birth defects if taken during pregnancy and for 3 months after the last dose. A reliable form of contraception is recommended while taking this medication and for 3 months after the last dose. Talk to your care team about effective forms of contraception. Do not breast-feed while taking this medication. What side effects may I notice from receiving this medication? Side effects that you should report to your care team as soon as possible: Allergic reactions--skin rash, itching, hives, swelling of the face, lips, tongue, or throat Infection--fever, chills, cough, sore throat, wounds that don't heal, pain or trouble when passing urine, general feeling of discomfort or being unwell Infusion reactions--chest pain, shortness of breath or trouble breathing, feeling faint or lightheaded Unusual bruising or bleeding Side effects that usually do not require medical attention (report to your care team if they continue or are bothersome): Constipation Diarrhea Fatigue Nausea Pain, tingling, or numbness in the hands or feet Swelling of the ankles, hands, or feet This list may not describe all possible side effects. Call your doctor for medical advice about side effects. You may report side effects to FDA at 1-800-FDA-1088. Where should I keep my medication? This medication is given in a hospital or clinic. It will not be stored at home. NOTE: This sheet is a summary. It may not cover all possible information. If you have questions about this medicine, talk to your doctor, pharmacist, or health care provider.  2023 Elsevier/Gold Standard (2021-07-12 00:00:00)   BELOW ARE SYMPTOMS THAT SHOULD BE REPORTED IMMEDIATELY: *FEVER GREATER THAN 100.4 F (38 C) OR HIGHER *CHILLS OR SWEATING *NAUSEA AND VOMITING THAT IS NOT CONTROLLED WITH YOUR NAUSEA MEDICATION *UNUSUAL SHORTNESS OF BREATH *UNUSUAL BRUISING OR  BLEEDING *URINARY PROBLEMS (pain or burning when urinating, or frequent urination) *BOWEL PROBLEMS (unusual diarrhea, constipation, pain near the anus) TENDERNESS IN MOUTH AND THROAT WITH OR WITHOUT PRESENCE OF ULCERS (sore throat, sores in mouth, or a toothache) UNUSUAL RASH, SWELLING OR PAIN  UNUSUAL VAGINAL DISCHARGE OR ITCHING   Items with * indicate a potential emergency and should be followed up as soon as possible or go to the Emergency Department if any problems should occur.  Please show the CHEMOTHERAPY ALERT CARD or IMMUNOTHERAPY ALERT CARD at check-in to the Emergency Department and triage nurse.  Should you have questions after your visit or need to cancel or reschedule your appointment, please contact [facility] [place] [phone]  and follow the prompts.  Office hours are 8:00 a.m. to 4:30 p.m. Monday - Friday. Please note that voicemails left after 4:00 p.m. may not be returned until the following business day.  We are closed weekends and major holidays. You have access to a nurse at all times for urgent questions. Please call the main number to the clinic [phone] and follow the prompts.  For any non-urgent questions, you may also contact your provider using MyChart. We now offer e-Visits for anyone 18 and older to request care online for non-urgent symptoms. For details visit [email or web address].   Also download the MyChart app! Go to   the app store, search "MyChart", open the app, select [facility], and log in with your MyChart username and password.   

## 2022-08-08 NOTE — Progress Notes (Signed)
Patient presents today for [name] [name] infusion. Patient is in satisfactory condition with no new complaints voiced.  Vital signs are stable.  Labs reviewed and all labs are within treatment parameters.  We will proceed with treatment per MD orders.    Pt's magnesium is 1.6 and blood sugar is 229 today. Pt stated he takes magnesium at home 2 times daily and does not want to take magnesium in the clinic today. He stated he has to take this evening. Pt also refused to take 10 units of insulin, he stated he was afraid it would drop his sugar too low.   [name] Sherburn given today per MD orders. Tolerated infusion without adverse affects. Vital signs stable. No complaints at this time. Discharged from clinic ambulatory with cane in stable condition. Alert and oriented x 3. F/U with [facility] as scheduled.

## 2022-08-08 NOTE — Progress Notes (Signed)
Patient took premeds from home at 1030 and steroids at 0930.

## 2022-08-10 ENCOUNTER — Other Ambulatory Visit: Payer: Self-pay

## 2022-08-13 DIAGNOSIS — M79672 Pain in left foot: Secondary | ICD-10-CM | POA: Diagnosis not present

## 2022-08-16 ENCOUNTER — Other Ambulatory Visit: Payer: Self-pay

## 2022-08-16 MED ORDER — LENALIDOMIDE 10 MG PO CAPS: 10.0000 mg | ORAL_CAPSULE | Freq: Every day | ORAL | 0 refills | Status: DC

## 2022-08-16 NOTE — Telephone Encounter (Signed)
Chart reviewed. Revlimid refilled per last office note with Dr. Katragadda.  

## 2022-08-17 DIAGNOSIS — E1122 Type 2 diabetes mellitus with diabetic chronic kidney disease: Secondary | ICD-10-CM | POA: Diagnosis not present

## 2022-08-17 DIAGNOSIS — E782 Mixed hyperlipidemia: Secondary | ICD-10-CM | POA: Diagnosis not present

## 2022-08-21 NOTE — Progress Notes (Signed)
[facility] [address] [place] [address]    Clinic Day:  08/22/2022  Referring physician: Benita Stabile, MD  Patient Care Team: Benita Stabile, MD as PCP - General (Internal Medicine) Wyline Mood Dorothe Pea, MD as Consulting Physician (Cardiology) Doreatha Massed, MD as Medical Oncologist (Medical Oncology)   ASSESSMENT & PLAN:   Assessment: IgG kappa smoldering multiple myeloma: - Skeletal survey in February 2022 was negative. - Bone marrow biopsy on 06/06/2020 with 20% plasma cells.  Myeloma FISH panel with monosomy 13/deletion 13.  Chromosome analysis 46, XY (20). - No "crab" features. - PET scan from 06/29/2021: No evidence of myeloma or plasmacytoma. - Skeletal survey on 06/13/2021: No lytic lesions. - MRI of the cervical/thoracic/lumbar spine negative for bone lesions with some degenerative changes. - BMBX (02/01/2022): Hypercellular marrow with plasma cells representing 35% of all cells.  They display kappa light chain restriction.  Cytogenetics-46, XY (20). - Myeloma FISH panel: Monosomy 13. - PET scan (04/05/2022): No suspicious bony lesions or soft tissue masses. - Daratumumab, Revlimid and dexamethasone started on 05/01/2022, could not tolerate Revlimid 15 mg dose as he had near syncope, Revlimid 15 mg stopped on 07/05/2022, dose decreased to 10 mg 3 weeks on/1 week off   2.  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 - No major chemical exposures.  He had personal history of melanoma of the upper lip. - Father had acute myeloid leukemia.  Mother had multiple myeloma.  Son died of acute lymphocytic leukemia    Plan: 1.  Stage II IgG kappa multiple myeloma, standard risk: - He is tolerating Revlimid 10 mg 3 weeks on 1 week off very well. - Reviewed myeloma labs from 07/25/2022: M spike improved to 0.6 g.  Free light chain ratio is 1.9 with kappa light chains normal at 17.5. - Reviewed labs today:  Normal LFTs and creatinine.  CBC normal. - He will start next cycle of Revlimid 10 mg 3 weeks on 1 week of on 08/24/2022.  Continue dexamethasone 20 mg weekly.  Continue cycle 5-day 1 Darzalex every 2 weeks for this cycle and cycle 6.  Starting cycle 7 he will receive Darzalex once a month. - RTC 4 weeks for follow-up.  2.  Peripheral neuropathy: - Continue gabapentin 300 mg 4 times daily.   3.  Prophylaxis: - Continue acyclovir 400 mg twice daily and aspirin 81 mg daily.   4.  Bone strengthening agents: - He does not have any bone lesions.  Will obtain DEXA scan prior to next visit.   5.  Hypomagnesemia: - Continue magnesium 1 tablet daily.  Magnesium is normal.    Orders Placed This Encounter  Procedures   DG Bone Density    Standing Status:   Future    Standing Expiration Date:   08/22/2023    Order Specific Question:   Reason for Exam (SYMPTOM  OR DIAGNOSIS REQUIRED)    Answer:   multiple myeloma    Order Specific Question:   Preferred imaging location?    Answer:   [facility]   CMP (Cancer Center only)    Standing Status:   Future    Standing Expiration Date:   10/17/2023   Protein electrophoresis, serum    Standing Status:   Future    Standing Expiration Date:   10/17/2023   Kappa/lambda light chains    Standing Status:   Future    Standing Expiration  Date:   10/17/2023   Magnesium    Standing Status:   Future    Standing Expiration Date:   10/17/2023   CBC with Differential    Standing Status:   Future    Standing Expiration Date:   10/17/2023   CMP (Cancer Center only)    Standing Status:   Future    Standing Expiration Date:   11/14/2023   Protein electrophoresis, serum    Standing Status:   Future    Standing Expiration Date:   11/14/2023   Kappa/lambda light chains    Standing Status:   Future    Standing Expiration Date:   11/14/2023   Magnesium    Standing Status:   Future    Standing Expiration Date:   11/14/2023   CBC with Differential    Standing  Status:   Future    Standing Expiration Date:   11/14/2023   CMP (Cancer Center only)    Standing Status:   Future    Standing Expiration Date:   12/12/2023   Protein electrophoresis, serum    Standing Status:   Future    Standing Expiration Date:   12/12/2023   Kappa/lambda light chains    Standing Status:   Future    Standing Expiration Date:   12/12/2023   Magnesium    Standing Status:   Future    Standing Expiration Date:   12/12/2023   CBC with Differential    Standing Status:   Future    Standing Expiration Date:   12/12/2023   CMP (Cancer Center only)    Standing Status:   Future    Standing Expiration Date:   01/09/2024   Protein electrophoresis, serum    Standing Status:   Future    Standing Expiration Date:   01/09/2024   Kappa/lambda light chains    Standing Status:   Future    Standing Expiration Date:   01/09/2024   Magnesium    Standing Status:   Future    Standing Expiration Date:   01/09/2024   CBC with Differential    Standing Status:   Future    Standing Expiration Date:   01/09/2024      I,Katie Daubenspeck,acting as a scribe for Doreatha Massed, MD.,have documented all relevant documentation on the behalf of Doreatha Massed, MD,as directed by  Doreatha Massed, MD while in the presence of Doreatha Massed, MD.   I, Doreatha Massed MD, have reviewed the above documentation for accuracy and completeness, and I agree with the above.   Doreatha Massed, MD   5/22/20246:08 PM  CHIEF COMPLAINT:   Diagnosis: IgG kappa multiple myeloma    Cancer Staging  Multiple myeloma (HCC) Staging form: Plasma Cell Myeloma and Plasma Cell Disorders, AJCC 8th Edition - Clinical stage from 04/11/2022: RISS Stage II (Beta-2-microglobulin (mg/L): 4.1, Albumin (g/dL): 2.7, ISS: Stage II, High-risk cytogenetics: Absent, LDH: Normal) - Signed by Doreatha Massed, MD on 04/11/2022    Prior Therapy: none  Current Therapy:  Daratumumab, Revlimid and  dexamethasone    HISTORY OF PRESENT ILLNESS:   Oncology History  Multiple myeloma (HCC)  04/11/2022 Initial Diagnosis   Multiple myeloma (HCC)   04/11/2022 Cancer Staging   Staging form: Plasma Cell Myeloma and Plasma Cell Disorders, AJCC 8th Edition - Clinical stage from 04/11/2022: RISS Stage II (Beta-2-microglobulin (mg/L): 4.1, Albumin (g/dL): 2.7, ISS: Stage II, High-risk cytogenetics: Absent, LDH: Normal) - Signed by Doreatha Massed, MD on 04/11/2022 Histopathologic type: Multiple myeloma Beta 2 microglobulin range (  mg/L): 3.5 to 5.49 Albumin range (g/dL): Less than 3.5 Cytogenetics: No abnormalities   05/01/2022 -  Chemotherapy   Patient is on Treatment Plan : MYELOMA  Daratumumab SQ + Lenalidomide + Dexamethasone (DaraRd) q28d        INTERVAL HISTORY:   [name] [name] is a 77 y.o. male presenting to clinic today for follow up of IgG kappa multiple myeloma. He was last seen by me on 07/25/22.  Today, he states that he is doing well overall. His appetite level is at 75%. His energy level is at 50%.  PAST MEDICAL HISTORY:   Past Medical History: Past Medical History:  Diagnosis Date   Adverse effect of unspecified drugs, medicaments and biological substances, initial encounter    Essential (primary) hypertension    Hyperlipidemia, unspecified    Impotence of organic origin    Malignant melanoma of skin, unspecified (HCC)    Malignant neoplasm of prostate (HCC)    Multiple myeloma (HCC)    Neuropathy    Non-pressure chronic ulcer of other part of unspecified foot with unspecified severity (HCC)    Reflux esophagitis    Restless legs syndrome    Tinea unguium    Type 2 diabetes mellitus with diabetic neuropathy, unspecified (HCC)    Type 2 diabetes mellitus with other specified complication (HCC)    Unspecified atrial fibrillation (HCC)     Surgical History: Past Surgical History:  Procedure Laterality Date   CATARACT EXTRACTION     HERNIA REPAIR     melanoma  removal     PROSTATE SURGERY      Social History: Social History   Socioeconomic History   Marital status: Married    Spouse name: Not on file   Number of children: Not on file   Years of education: Not on file   Highest education level: Not on file  Occupational History   Not on file  Tobacco Use   Smoking status: Never   Smokeless tobacco: Never  Vaping Use   Vaping Use: Never used  Substance and Sexual Activity   Alcohol use: No    Alcohol/week: 0.0 standard drinks of alcohol   Drug use: No   Sexual activity: Not Currently  Other Topics Concern   Not on file  Social History Narrative   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   Social Determinants of Health   Financial Resource Strain: Low Risk  (05/13/2020)   Overall Financial Resource Strain (CARDIA)    Difficulty of Paying Living Expenses: Not hard at all  Food Insecurity: No Food Insecurity (05/13/2020)   Hunger Vital Sign    Worried About Running Out of Food in the Last Year: Never true    Ran Out of Food in the Last Year: Never true  Transportation Needs: No Transportation Needs (05/13/2020)   PRAPARE - Administrator, Civil Service (Medical): No    Lack of Transportation (Non-Medical): No  Physical Activity: Insufficiently Active (05/13/2020)   Exercise Vital Sign    Days of Exercise per Week: 3 days    Minutes of Exercise per Session: 20 min  Stress: No Stress Concern Present (05/13/2020)   [employer] of Occupational Health - Occupational Stress Questionnaire    Feeling of Stress : Not at all  Social Connections: Moderately Integrated (05/13/2020)   Social Connection and Isolation Panel [NHANES]    Frequency of Communication with Friends and Family: Twice a week    Frequency of Social  Gatherings with Friends and Family: Twice a week    Attends Religious Services: 1 to 4 times per year    Active Member of [employer] or Organizations: No    Attends Tax inspector Meetings: Never    Marital Status: Married  Catering manager Violence: Not At Risk (05/13/2020)   Humiliation, Afraid, Rape, and Kick questionnaire    Fear of Current or Ex-Partner: No    Emotionally Abused: No    Physically Abused: No    Sexually Abused: No    Family History: Family History  Problem Relation Age of Onset   Multiple myeloma Mother    Acute myelogenous leukemia Father    Leukemia Son        acute lymphocytic leukemia    Current Medications:  Current Outpatient Medications:    ACCU-CHEK AVIVA PLUS test strip, USE 1 STRIP TO CHECK GLUCOSE TWICE DAILY, Disp: , Rfl:    Accu-Chek FastClix Lancets MISC, Apply topically 2 (two) times daily., Disp: , Rfl:    acyclovir (ZOVIRAX) 400 MG tablet, Take 1 tablet (400 mg total) by mouth 2 (two) times daily., Disp: 60 tablet, Rfl: 6   aspirin EC 81 MG tablet, Take 1 tablet (81 mg total) by mouth daily. Swallow whole., Disp: 30 tablet, Rfl: 12   atenolol-chlorthalidone (TENORETIC) 50-25 MG per tablet, Take 1 tablet by mouth daily. Takes 1/2 daily per Dr Juanetta Gosling, Disp: , Rfl:    cyanocobalamin (VITAMIN B12) 1000 MCG/ML injection, Inject 1,000 mcg into the muscle every 30 (thirty) days., Disp: , Rfl:    Daratumumab-Hyaluronidase-fihj (DARZALEX FASPRO [place]), Inject into the skin., Disp: , Rfl:    dexamethasone (DECADRON) 4 MG tablet, Take 5 tablets (20 mg total) by mouth once a week., Disp: 30 tablet, Rfl: 6   diclofenac sodium (VOLTAREN) 1 % GEL, Apply 2 g topically daily as needed., Disp: , Rfl:    gabapentin (NEURONTIN) 300 MG capsule, Take 300 mg by mouth 3 (three) times daily., Disp: , Rfl:    lenalidomide (REVLIMID) 10 MG capsule, Take 1 capsule (10 mg total) by mouth daily. 21 days on, 7 days off, Disp: 21 capsule, Rfl: 0   loperamide (IMODIUM A-D) 2 MG tablet, Take 2 mg by mouth 4 (four) times daily as needed for diarrhea or loose stools., Disp: , Rfl:    losartan (COZAAR) 50 MG tablet, Take 50 mg by mouth daily.,  Disp: , Rfl:    magnesium oxide (MAG-OX) 400 (240 Mg) MG tablet, Take 1 tablet (400 mg total) by mouth 2 (two) times daily., Disp: 120 tablet, Rfl: 3   metFORMIN (GLUCOPHAGE) 500 MG tablet, Take 500 mg by mouth 2 (two) times daily with a meal., Disp: , Rfl:    pantoprazole (PROTONIX) 40 MG tablet, Take 40 mg by mouth daily., Disp: , Rfl:    prochlorperazine (COMPAZINE) 10 MG tablet, Take 1 tablet (10 mg total) by mouth every 6 (six) hours as needed for nausea or vomiting., Disp: 30 tablet, Rfl: 3   silver sulfADIAZINE (SILVADENE) 1 % cream, Apply topically., Disp: , Rfl:    Allergies: Allergies  Allergen Reactions   Penicillins Rash    Also swelling reaction    Gadavist [Gadobutrol] Nausea Only    Patient became nauseated immediately after contrast injection.  Was better after a few minutes.     REVIEW OF SYSTEMS:   Review of Systems  Constitutional:  Negative for chills, fatigue and fever.  HENT:   Negative for lump/mass, mouth sores, nosebleeds, sore  throat and trouble swallowing.   Eyes:  Negative for eye problems.  Respiratory:  Negative for cough and shortness of breath.   Cardiovascular:  Negative for chest pain, leg swelling and palpitations.  Gastrointestinal:  Positive for constipation. Negative for abdominal pain, diarrhea, nausea and vomiting.  Genitourinary:  Negative for bladder incontinence, difficulty urinating, dysuria, frequency, hematuria and nocturia.   Musculoskeletal:  Negative for arthralgias, back pain, flank pain, myalgias and neck pain.  Skin:  Negative for itching and rash.  Neurological:  Positive for numbness. Negative for dizziness and headaches.  Hematological:  Does not bruise/bleed easily.  Psychiatric/Behavioral:  Positive for sleep disturbance. Negative for depression and suicidal ideas. The patient is not nervous/anxious.   All other systems reviewed and are negative.    VITALS:   Blood pressure 132/61, pulse (!) 59, temperature (!) 97.5 F (36.4  C), temperature source Oral, resp. rate 18, weight 180 lb (81.6 kg), SpO2 96 %.  Wt Readings from Last 3 Encounters:  08/22/22 180 lb (81.6 kg)  07/25/22 176 lb 4.8 oz (80 kg)  07/11/22 178 lb 3.2 oz (80.8 kg)    Body mass index is 27.37 kg/m.  Performance status (ECOG): 1 - Symptomatic but completely ambulatory  PHYSICAL EXAM:   Physical Exam Vitals and nursing note reviewed. Exam conducted with a chaperone present.  Constitutional:      Appearance: Normal appearance.  Cardiovascular:     Rate and Rhythm: Normal rate and regular rhythm.     Pulses: Normal pulses.     Heart sounds: Normal heart sounds.  Pulmonary:     Effort: Pulmonary effort is normal.     Breath sounds: Normal breath sounds.  Abdominal:     Palpations: Abdomen is soft. There is no hepatomegaly, splenomegaly or mass.     Tenderness: There is no abdominal tenderness.  Musculoskeletal:     Right lower leg: No edema.     Left lower leg: No edema.  Lymphadenopathy:     Cervical: No cervical adenopathy.     Right cervical: No superficial, deep or posterior cervical adenopathy.    Left cervical: No superficial, deep or posterior cervical adenopathy.     Upper Body:     Right upper body: No supraclavicular or axillary adenopathy.     Left upper body: No supraclavicular or axillary adenopathy.  Neurological:     General: No focal deficit present.     Mental Status: He is alert and oriented to person, place, and time.  Psychiatric:        Mood and Affect: Mood normal.        Behavior: Behavior normal.     LABS:      Latest Ref Rng & Units 08/22/2022    9:34 AM 08/08/2022    9:42 AM 07/25/2022   12:19 PM  CBC  WBC 4.0 - 10.5 K/uL 6.3  8.6  8.0   Hemoglobin 13.0 - 17.0 g/dL 57.8  46.9  62.9   Hematocrit 39.0 - 52.0 % 38.6  40.2  41.1   Platelets 150 - 400 K/uL 251  217  304       Latest Ref Rng & Units 08/22/2022    9:34 AM 08/08/2022    9:42 AM 07/25/2022   12:19 PM  CMP  Glucose 70 - 99 mg/dL 528   413  244   BUN 8 - 23 mg/dL 16  17  16    Creatinine 0.61 - 1.24 mg/dL 0.10  2.72  5.36  Sodium 135 - 145 mmol/L 135  136  137   Potassium 3.5 - 5.1 mmol/L 3.4  4.2  3.4   Chloride 98 - 111 mmol/L 98  99  101   CO2 22 - 32 mmol/L 26  27  26    Calcium 8.9 - 10.3 mg/dL 8.4  8.7  9.0   Total Protein 6.5 - 8.1 g/dL 6.5  6.6  6.9   Total Bilirubin 0.3 - 1.2 mg/dL 0.8  0.7  0.9   Alkaline Phos 38 - 126 U/L 101  114  133   AST 15 - 41 U/L 14  14  15    ALT 0 - 44 U/L 12  14  12       No results found for: "CEA1", "CEA" / No results found for: "CEA1", "CEA" No results found for: "PSA1" No results found for: "JXB147" No results found for: "CAN125"  Lab Results  Component Value Date   TOTALPROTELP 6.4 07/25/2022   ALBUMINELP 3.5 07/25/2022   A1GS 0.2 07/25/2022   A2GS 0.8 07/25/2022   BETS 0.9 07/25/2022   GAMS 1.0 07/25/2022   MSPIKE 0.6 (H) 07/25/2022   SPEI Comment 07/25/2022   No results found for: "TIBC", "FERRITIN", "IRONPCTSAT" Lab Results  Component Value Date   LDH 89 (L) 04/05/2022   LDH 93 (L) 01/01/2022   LDH 107 10/04/2021     STUDIES:   No results found.

## 2022-08-22 ENCOUNTER — Inpatient Hospital Stay: Payer: Medicare Other

## 2022-08-22 ENCOUNTER — Inpatient Hospital Stay: Payer: Medicare Other | Admitting: Hematology

## 2022-08-22 ENCOUNTER — Encounter: Payer: Self-pay | Admitting: Hematology

## 2022-08-22 VITALS — BP 132/61 | HR 59 | Temp 97.5°F | Resp 18 | Wt 180.0 lb

## 2022-08-22 DIAGNOSIS — C9000 Multiple myeloma not having achieved remission: Secondary | ICD-10-CM

## 2022-08-22 DIAGNOSIS — Z5112 Encounter for antineoplastic immunotherapy: Secondary | ICD-10-CM | POA: Diagnosis not present

## 2022-08-22 LAB — CMP (CANCER CENTER ONLY)
ALT: 12 U/L (ref 0–44)
AST: 14 U/L — ABNORMAL LOW (ref 15–41)
Albumin: 3.8 g/dL (ref 3.5–5.0)
Alkaline Phosphatase: 101 U/L (ref 38–126)
Anion gap: 11 (ref 5–15)
BUN: 16 mg/dL (ref 8–23)
CO2: 26 mmol/L (ref 22–32)
Calcium: 8.4 mg/dL — ABNORMAL LOW (ref 8.9–10.3)
Chloride: 98 mmol/L (ref 98–111)
Creatinine: 1.12 mg/dL (ref 0.61–1.24)
GFR, Estimated: >60 mL/min (ref >60)
Glucose, Bld: 222 mg/dL — ABNORMAL HIGH (ref 70–99)
Potassium: 3.4 mmol/L — ABNORMAL LOW (ref 3.5–5.1)
Sodium: 135 mmol/L (ref 135–145)
Total Bilirubin: 0.8 mg/dL (ref 0.3–1.2)
Total Protein: 6.5 g/dL (ref 6.5–8.1)

## 2022-08-22 LAB — CBC WITH DIFFERENTIAL/PLATELET
Abs Immature Granulocytes: 0.02 10*3/uL (ref 0.00–0.07)
Basophils Absolute: 0 10*3/uL (ref 0.0–0.1)
Basophils Relative: 1 %
Eosinophils Absolute: 0.5 10*3/uL (ref 0.0–0.5)
Eosinophils Relative: 8 %
HCT: 38.6 % — ABNORMAL LOW (ref 39.0–52.0)
Hemoglobin: 13.1 g/dL (ref 13.0–17.0)
Immature Granulocytes: 0 %
Lymphocytes Relative: 27 %
Lymphs Abs: 1.7 10*3/uL (ref 0.7–4.0)
MCH: 30.6 pg (ref 26.0–34.0)
MCHC: 33.9 g/dL (ref 30.0–36.0)
MCV: 90.2 fL (ref 80.0–100.0)
Monocytes Absolute: 0.9 10*3/uL (ref 0.1–1.0)
Monocytes Relative: 14 %
Neutro Abs: 3.2 10*3/uL (ref 1.7–7.7)
Neutrophils Relative %: 50 %
Platelets: 251 10*3/uL (ref 150–400)
RBC: 4.28 MIL/uL (ref 4.22–5.81)
RDW: 14.8 % (ref 11.5–15.5)
WBC: 6.3 10*3/uL (ref 4.0–10.5)
nRBC: 0 % (ref 0.0–0.2)

## 2022-08-22 LAB — MAGNESIUM: Magnesium: 1.7 mg/dL (ref 1.7–2.4)

## 2022-08-22 MED ORDER — DARATUMUMAB-HYALURONIDASE-FIHJ 1800-30000 MG-UT/15ML ~~LOC~~ SOLN
1800.0000 mg | Freq: Once | SUBCUTANEOUS | Status: AC
  Administered 2022-08-22: 1800 mg via SUBCUTANEOUS
  Filled 2022-08-22: qty 15

## 2022-08-22 NOTE — Patient Instructions (Signed)
[facility] [place]  Discharge Instructions: Thank you for choosing [facility] to provide your oncology and hematology care.  If you have a lab appointment with the Cancer Center - please note that after April 8th, 2024, all labs will be drawn in the cancer center.  You do not have to check in or register with the main entrance as you have in the past but will complete your check-in in the cancer center.  Wear comfortable clothing and clothing appropriate for easy access to any Portacath or PICC line.   We strive to give you quality time with your provider. You may need to reschedule your appointment if you arrive late (15 or more minutes).  Arriving late affects you and other patients whose appointments are after yours.  Also, if you miss three or more appointments without notifying the office, you may be dismissed from the clinic at the provider's discretion.      For prescription refill requests, have your pharmacy contact our office and allow 72 hours for refills to be completed.    Today you received the following chemotherapy and/or immunotherapy agents Daratumumab.   Daratumumab; Hyaluronidase Injection What is this medication? DARATUMUMAB; HYALURONIDASE (dar a toom ue mab; hye al ur ON i dase) treats multiple myeloma, a type of bone marrow cancer. Daratumumab works by blocking a protein that causes cancer cells to grow and multiply. This helps to slow or stop the spread of cancer cells. Hyaluronidase works by increasing the absorption of other medications in the body to help them work better. This medication may also be used treat amyloidosis, a condition that causes the buildup of a protein (amyloid) in your body. It works by reducing the buildup of this protein, which decreases symptoms. It is a combination medication that contains a monoclonal antibody. This medicine may be used for other purposes; ask your health care provider or pharmacist if you have  questions. COMMON BRAND NAME(S): DARZALEX FASPRO What should I tell my care team before I take this medication? They need to know if you have any of these conditions: Heart disease Infection, such as chickenpox, cold sores, herpes, hepatitis B Lung or breathing disease An unusual or allergic reaction to daratumumab, hyaluronidase, other medications, foods, dyes, or preservatives Pregnant or trying to get pregnant Breast-feeding How should I use this medication? This medication is injected under the skin. It is given by your care team in a hospital or clinic setting. Talk to your care team about the use of this medication in children. Special care may be needed. Overdosage: If you think you have taken too much of this medicine contact a poison control center or emergency room at once. NOTE: This medicine is only for you. Do not share this medicine with others. What if I miss a dose? Keep appointments for follow-up doses. It is important not to miss your dose. Call your care team if you are unable to keep an appointment. What may interact with this medication? Interactions have not been studied. This list may not describe all possible interactions. Give your health care provider a list of all the medicines, herbs, non-prescription drugs, or dietary supplements you use. Also tell them if you smoke, drink alcohol, or use illegal drugs. Some items may interact with your medicine. What should I watch for while using this medication? Your condition will be monitored carefully while you are receiving this medication. This medication can cause serious allergic reactions. To reduce your risk, your care team   may give you other medication to take before receiving this one. Be sure to follow the directions from your care team. This medication can affect the results of blood tests to match your blood type. These changes can last for up to 6 months after the final dose. Your care team will do blood tests to  match your blood type before you start treatment. Tell all of your care team that you are being treated with this medication before receiving a blood transfusion. This medication can affect the results of some tests used to determine treatment response; extra tests may be needed to evaluate response. Talk to your care team if you wish to become pregnant or think you are pregnant. This medication can cause serious birth defects if taken during pregnancy and for 3 months after the last dose. A reliable form of contraception is recommended while taking this medication and for 3 months after the last dose. Talk to your care team about effective forms of contraception. Do not breast-feed while taking this medication. What side effects may I notice from receiving this medication? Side effects that you should report to your care team as soon as possible: Allergic reactions--skin rash, itching, hives, swelling of the face, lips, tongue, or throat Heart rhythm changes--fast or irregular heartbeat, dizziness, feeling faint or lightheaded, chest pain, trouble breathing Infection--fever, chills, cough, sore throat, wounds that don't heal, pain or trouble when passing urine, general feeling of discomfort or being unwell Infusion reactions--chest pain, shortness of breath or trouble breathing, feeling faint or lightheaded Sudden eye pain or change in vision such as blurry vision, seeing halos around lights, vision loss Unusual bruising or bleeding Side effects that usually do not require medical attention (report to your care team if they continue or are bothersome): Constipation Diarrhea Fatigue Nausea Pain, tingling, or numbness in the hands or feet Swelling of the ankles, hands, or feet This list may not describe all possible side effects. Call your doctor for medical advice about side effects. You may report side effects to FDA at 1-800-FDA-1088. Where should I keep my medication? This medication is given  in a hospital or clinic. It will not be stored at home. NOTE: This sheet is a summary. It may not cover all possible information. If you have questions about this medicine, talk to your doctor, pharmacist, or health care provider.  2023 Elsevier/Gold Standard (2021-07-12 00:00:00)        To help prevent nausea and vomiting after your treatment, we encourage you to take your nausea medication as directed.  BELOW ARE SYMPTOMS THAT SHOULD BE REPORTED IMMEDIATELY: *FEVER GREATER THAN 100.4 F (38 C) OR HIGHER *CHILLS OR SWEATING *NAUSEA AND VOMITING THAT IS NOT CONTROLLED WITH YOUR NAUSEA MEDICATION *UNUSUAL SHORTNESS OF BREATH *UNUSUAL BRUISING OR BLEEDING *URINARY PROBLEMS (pain or burning when urinating, or frequent urination) *BOWEL PROBLEMS (unusual diarrhea, constipation, pain near the anus) TENDERNESS IN MOUTH AND THROAT WITH OR WITHOUT PRESENCE OF ULCERS (sore throat, sores in mouth, or a toothache) UNUSUAL RASH, SWELLING OR PAIN  UNUSUAL VAGINAL DISCHARGE OR ITCHING   Items with * indicate a potential emergency and should be followed up as soon as possible or go to the Emergency Department if any problems should occur.  Please show the CHEMOTHERAPY ALERT CARD or IMMUNOTHERAPY ALERT CARD at check-in to the Emergency Department and triage nurse.  Should you have questions after your visit or need to cancel or reschedule your appointment, please contact [facility] [phone]  and   follow the prompts.  Office hours are 8:00 a.m. to 4:30 p.m. Monday - Friday. Please note that voicemails left after 4:00 p.m. may not be returned until the following business day.  We are closed weekends and major holidays. You have access to a nurse at all times for urgent questions. Please call the main number to the clinic [phone] and follow the prompts.  For any non-urgent questions, you may also contact your provider using MyChart. We now offer e-Visits for anyone 18 and older  to request care online for non-urgent symptoms. For details visit [email or web address].   Also download the MyChart app! Go to the app store, search "MyChart", open the app, select [place], and log in with your MyChart username and password.   

## 2022-08-22 NOTE — Progress Notes (Signed)
Patient is taking Revlimid as prescribed.  He has not missed any doses and reports no side effects at this time.    Patient has been examined by Dr. Katragadda. Vital signs and labs have been reviewed by MD - ANC, Creatinine, LFTs, hemoglobin, and platelets are within treatment parameters per M.D. - pt may proceed with treatment.  Primary RN and pharmacy notified.  

## 2022-08-22 NOTE — Patient Instructions (Signed)
[facility] Discharge Instructions   You were seen and examined today by Dr. Ellin Saba.  He reviewed the results of your lab work which are normal/stable.   We will proceed with your treatment today.   Continue Revlimid as prescribed.   Be sure you take the steroid pills (5 pills once a week) even on the weeks you do not come to the clinic for the injections.   Return as scheduled.    Thank you for choosing [facility] to provide your oncology and hematology care.  To afford each patient quality time with our provider, please arrive at least 15 minutes before your scheduled appointment time.   If you have a lab appointment with the Cancer Center please come in thru the Main Entrance and check in at the main information desk.  You need to re-schedule your appointment should you arrive 10 or more minutes late.  We strive to give you quality time with our providers, and arriving late affects you and other patients whose appointments are after yours.  Also, if you no show three or more times for appointments you may be dismissed from the clinic at the providers discretion.     Again, thank you for choosing [facility].  Our hope is that these requests will decrease the amount of time that you wait before being seen by our physicians.       _____________________________________________________________  Should you have questions after your visit to [facility], please contact our office at [phone] and follow the prompts.  Our office hours are 8:00 a.m. and 4:30 p.m. Monday - Friday.  Please note that voicemails left after 4:00 p.m. may not be returned until the following business day.  We are closed weekends and major holidays.  You do have access to a nurse 24-7, just call the main number to the clinic [phone] and do not press any options, hold on the line and a nurse will answer the  phone.    For prescription refill requests, have your pharmacy contact our office and allow 72 hours.    Due to Covid, you will need to wear a mask upon entering the hospital. If you do not have a mask, a mask will be given to you at the Main Entrance upon arrival. For doctor visits, patients may have 1 support person age 16 or older with them. For treatment visits, patients can not have anyone with them due to social distancing guidelines and our immunocompromised population.

## 2022-08-22 NOTE — Progress Notes (Signed)
Patient presents today for Daratumumab injection.  Patient is in satisfactory condition with no new complaints voiced.  Vital signs are stable.  Labs reviewed by Dr. Ellin Saba during the office visit and all labs are within treatment parameters.  Tylenol and Zyrtec were taken at 0900 at home prior to visit.  We will hold pre-medications today.  We will proceed with treatment per MD orders.   Patient tolerated injection well with no complaints voiced.  Patient left ambulatory in stable condition.  Vital signs stable at discharge.  Follow up as scheduled.

## 2022-08-23 LAB — KAPPA/LAMBDA LIGHT CHAINS
Kappa free light chain: 19 mg/L (ref 3.3–19.4)
Kappa, lambda light chain ratio: 1.32 (ref 0.26–1.65)
Lambda free light chains: 14.4 mg/L (ref 5.7–26.3)

## 2022-08-24 ENCOUNTER — Other Ambulatory Visit: Payer: Self-pay

## 2022-08-24 LAB — PROTEIN ELECTROPHORESIS, SERUM
A/G Ratio: 1.3 (ref 0.7–1.7)
Albumin ELP: 3.1 g/dL (ref 2.9–4.4)
Alpha-1-Globulin: 0.2 g/dL (ref 0.0–0.4)
Alpha-2-Globulin: 0.7 g/dL (ref 0.4–1.0)
Beta Globulin: 0.7 g/dL (ref 0.7–1.3)
Gamma Globulin: 0.7 g/dL (ref 0.4–1.8)
Globulin, Total: 2.3 g/dL (ref 2.2–3.9)
M-Spike, %: 0.2 g/dL — ABNORMAL HIGH
Total Protein ELP: 5.4 g/dL — ABNORMAL LOW (ref 6.0–8.5)

## 2022-08-27 ENCOUNTER — Other Ambulatory Visit: Payer: Self-pay

## 2022-09-04 ENCOUNTER — Other Ambulatory Visit: Payer: Self-pay

## 2022-09-04 DIAGNOSIS — C9000 Multiple myeloma not having achieved remission: Secondary | ICD-10-CM

## 2022-09-05 ENCOUNTER — Inpatient Hospital Stay: Payer: Medicare Other | Attending: Hematology

## 2022-09-05 ENCOUNTER — Inpatient Hospital Stay: Payer: Medicare Other

## 2022-09-05 ENCOUNTER — Ambulatory Visit: Payer: Medicare Other | Admitting: Hematology

## 2022-09-05 VITALS — BP 126/70 | HR 86 | Temp 96.2°F | Resp 20 | Wt 177.4 lb

## 2022-09-05 DIAGNOSIS — C9000 Multiple myeloma not having achieved remission: Secondary | ICD-10-CM | POA: Diagnosis not present

## 2022-09-05 DIAGNOSIS — Z5112 Encounter for antineoplastic immunotherapy: Secondary | ICD-10-CM | POA: Diagnosis not present

## 2022-09-05 LAB — CBC WITH DIFFERENTIAL/PLATELET
Abs Immature Granulocytes: 0.03 10*3/uL (ref 0.00–0.07)
Basophils Absolute: 0.1 10*3/uL (ref 0.0–0.1)
Basophils Relative: 1 %
Eosinophils Absolute: 0.6 10*3/uL — ABNORMAL HIGH (ref 0.0–0.5)
Eosinophils Relative: 10 %
HCT: 37.3 % — ABNORMAL LOW (ref 39.0–52.0)
Hemoglobin: 12.8 g/dL — ABNORMAL LOW (ref 13.0–17.0)
Immature Granulocytes: 1 %
Lymphocytes Relative: 19 %
Lymphs Abs: 1.2 10*3/uL (ref 0.7–4.0)
MCH: 30.7 pg (ref 26.0–34.0)
MCHC: 34.3 g/dL (ref 30.0–36.0)
MCV: 89.4 fL (ref 80.0–100.0)
Monocytes Absolute: 0.8 10*3/uL (ref 0.1–1.0)
Monocytes Relative: 13 %
Neutro Abs: 3.6 10*3/uL (ref 1.7–7.7)
Neutrophils Relative %: 56 %
Platelets: 227 10*3/uL (ref 150–400)
RBC: 4.17 MIL/uL — ABNORMAL LOW (ref 4.22–5.81)
RDW: 15.2 % (ref 11.5–15.5)
WBC: 6.4 10*3/uL (ref 4.0–10.5)
nRBC: 0 % (ref 0.0–0.2)

## 2022-09-05 LAB — COMPREHENSIVE METABOLIC PANEL
ALT: 13 U/L (ref 0–44)
AST: 12 U/L — ABNORMAL LOW (ref 15–41)
Albumin: 3.5 g/dL (ref 3.5–5.0)
Alkaline Phosphatase: 95 U/L (ref 38–126)
Anion gap: 10 (ref 5–15)
BUN: 16 mg/dL (ref 8–23)
CO2: 28 mmol/L (ref 22–32)
Calcium: 8.6 mg/dL — ABNORMAL LOW (ref 8.9–10.3)
Chloride: 99 mmol/L (ref 98–111)
Creatinine, Ser: 1.12 mg/dL (ref 0.61–1.24)
GFR, Estimated: >60 mL/min (ref >60)
Glucose, Bld: 209 mg/dL — ABNORMAL HIGH (ref 70–99)
Potassium: 3.3 mmol/L — ABNORMAL LOW (ref 3.5–5.1)
Sodium: 137 mmol/L (ref 135–145)
Total Bilirubin: 1.1 mg/dL (ref 0.3–1.2)
Total Protein: 6.1 g/dL — ABNORMAL LOW (ref 6.5–8.1)

## 2022-09-05 LAB — MAGNESIUM: Magnesium: 1.8 mg/dL (ref 1.7–2.4)

## 2022-09-05 MED ORDER — DARATUMUMAB-HYALURONIDASE-FIHJ 1800-30000 MG-UT/15ML ~~LOC~~ SOLN
1800.0000 mg | Freq: Once | SUBCUTANEOUS | Status: AC
  Administered 2022-09-05: 1800 mg via SUBCUTANEOUS
  Filled 2022-09-05: qty 15

## 2022-09-05 NOTE — Patient Instructions (Signed)
[facility]  Discharge Instructions: Thank you for choosing [facility] to provide your oncology and hematology care.  If you have a lab appointment with the Cancer Center - please note that after April 8th, 2024, all labs will be drawn in the cancer center.  You do not have to check in or register with the main entrance as you have in the past but will complete your check-in in the cancer center.  Wear comfortable clothing and clothing appropriate for easy access to any Portacath or PICC line.   We strive to give you quality time with your provider. You may need to reschedule your appointment if you arrive late (15 or more minutes).  Arriving late affects you and other patients whose appointments are after yours.  Also, if you miss three or more appointments without notifying the office, you may be dismissed from the clinic at the provider's discretion.      For prescription refill requests, have your pharmacy contact our office and allow 72 hours for refills to be completed.    Today you received the following chemotherapy and/or immunotherapy agents darzalex.  Daratumumab; Hyaluronidase Injection What is this medication? DARATUMUMAB; HYALURONIDASE (dar a toom ue mab; hye al ur ON i dase) treats multiple myeloma, a type of bone marrow cancer. Daratumumab works by blocking a protein that causes cancer cells to grow and multiply. This helps to slow or stop the spread of cancer cells. Hyaluronidase works by increasing the absorption of other medications in the body to help them work better. This medication may also be used treat amyloidosis, a condition that causes the buildup of a protein (amyloid) in your body. It works by reducing the buildup of this protein, which decreases symptoms. It is a combination medication that contains a monoclonal antibody. This medicine may be used for other purposes; ask your health care provider or pharmacist if you have  questions. COMMON BRAND NAME(S): DARZALEX FASPRO What should I tell my care team before I take this medication? They need to know if you have any of these conditions: Heart disease Infection, such as chickenpox, cold sores, herpes, hepatitis B Lung or breathing disease An unusual or allergic reaction to daratumumab, hyaluronidase, other medications, foods, dyes, or preservatives Pregnant or trying to get pregnant Breast-feeding How should I use this medication? This medication is injected under the skin. It is given by your care team in a hospital or clinic setting. Talk to your care team about the use of this medication in children. Special care may be needed. Overdosage: If you think you have taken too much of this medicine contact a poison control center or emergency room at once. NOTE: This medicine is only for you. Do not share this medicine with others. What if I miss a dose? Keep appointments for follow-up doses. It is important not to miss your dose. Call your care team if you are unable to keep an appointment. What may interact with this medication? Interactions have not been studied. This list may not describe all possible interactions. Give your health care provider a list of all the medicines, herbs, non-prescription drugs, or dietary supplements you use. Also tell them if you smoke, drink alcohol, or use illegal drugs. Some items may interact with your medicine. What should I watch for while using this medication? Your condition will be monitored carefully while you are receiving this medication. This medication can cause serious allergic reactions. To reduce your risk, your care team may  give you other medication to take before receiving this one. Be sure to follow the directions from your care team. This medication can affect the results of blood tests to match your blood type. These changes can last for up to 6 months after the final dose. Your care team will do blood tests to  match your blood type before you start treatment. Tell all of your care team that you are being treated with this medication before receiving a blood transfusion. This medication can affect the results of some tests used to determine treatment response; extra tests may be needed to evaluate response. Talk to your care team if you wish to become pregnant or think you are pregnant. This medication can cause serious birth defects if taken during pregnancy and for 3 months after the last dose. A reliable form of contraception is recommended while taking this medication and for 3 months after the last dose. Talk to your care team about effective forms of contraception. Do not breast-feed while taking this medication. What side effects may I notice from receiving this medication? Side effects that you should report to your care team as soon as possible: Allergic reactions--skin rash, itching, hives, swelling of the face, lips, tongue, or throat Heart rhythm changes--fast or irregular heartbeat, dizziness, feeling faint or lightheaded, chest pain, trouble breathing Infection--fever, chills, cough, sore throat, wounds that don't heal, pain or trouble when passing urine, general feeling of discomfort or being unwell Infusion reactions--chest pain, shortness of breath or trouble breathing, feeling faint or lightheaded Sudden eye pain or change in vision such as blurry vision, seeing halos around lights, vision loss Unusual bruising or bleeding Side effects that usually do not require medical attention (report to your care team if they continue or are bothersome): Constipation Diarrhea Fatigue Nausea Pain, tingling, or numbness in the hands or feet Swelling of the ankles, hands, or feet This list may not describe all possible side effects. Call your doctor for medical advice about side effects. You may report side effects to FDA at 1-800-FDA-1088. Where should I keep my medication? This medication is given  in a hospital or clinic. It will not be stored at home. NOTE: This sheet is a summary. It may not cover all possible information. If you have questions about this medicine, talk to your doctor, pharmacist, or health care provider.  2024 Elsevier/Gold Standard (2021-07-25 00:00:00)       To help prevent nausea and vomiting after your treatment, we encourage you to take your nausea medication as directed.  BELOW ARE SYMPTOMS THAT SHOULD BE REPORTED IMMEDIATELY: *FEVER GREATER THAN 100.4 F (38 C) OR HIGHER *CHILLS OR SWEATING *NAUSEA AND VOMITING THAT IS NOT CONTROLLED WITH YOUR NAUSEA MEDICATION *UNUSUAL SHORTNESS OF BREATH *UNUSUAL BRUISING OR BLEEDING *URINARY PROBLEMS (pain or burning when urinating, or frequent urination) *BOWEL PROBLEMS (unusual diarrhea, constipation, pain near the anus) TENDERNESS IN MOUTH AND THROAT WITH OR WITHOUT PRESENCE OF ULCERS (sore throat, sores in mouth, or a toothache) UNUSUAL RASH, SWELLING OR PAIN  UNUSUAL VAGINAL DISCHARGE OR ITCHING   Items with * indicate a potential emergency and should be followed up as soon as possible or go to the Emergency Department if any problems should occur.  Please show the CHEMOTHERAPY ALERT CARD or IMMUNOTHERAPY ALERT CARD at check-in to the Emergency Department and triage nurse.  Should you have questions after your visit or need to cancel or reschedule your appointment, please contact [facility] [phone]  and follow the  prompts.  Office hours are 8:00 a.m. to 4:30 p.m. Monday - Friday. Please note that voicemails left after 4:00 p.m. may not be returned until the following business day.  We are closed weekends and major holidays. You have access to a nurse at all times for urgent questions. Please call the main number to the clinic [phone] and follow the prompts.  For any non-urgent questions, you may also contact your provider using MyChart. We now offer e-Visits for anyone 75 and older  to request care online for non-urgent symptoms. For details visit [email or web address].   Also download the MyChart app! Go to the app store, search "MyChart", open the app, select [place], and log in with your MyChart username and password.

## 2022-09-05 NOTE — Progress Notes (Signed)
Patient took premeds from home.    Patient tolerated Daratumumab injection with no complaints voiced.  See MAR for details.  Labs reviewed. Injection site clean and dry with no bruising or swelling noted at site.  Band aid applied.  Vss with discharge and left in satisfactory condition with no s/s of distress noted.

## 2022-09-08 ENCOUNTER — Other Ambulatory Visit: Payer: Self-pay

## 2022-09-10 ENCOUNTER — Other Ambulatory Visit: Payer: Self-pay

## 2022-09-10 DIAGNOSIS — M79672 Pain in left foot: Secondary | ICD-10-CM | POA: Diagnosis not present

## 2022-09-10 MED ORDER — LENALIDOMIDE 10 MG PO CAPS: 10.0000 mg | ORAL_CAPSULE | Freq: Every day | ORAL | 0 refills | Status: DC

## 2022-09-10 NOTE — Telephone Encounter (Signed)
Chart reviewed. Revlimid refilled per last office note with Dr. Katragadda.  

## 2022-09-11 ENCOUNTER — Ambulatory Visit (HOSPITAL_COMMUNITY)
Admission: RE | Admit: 2022-09-11 | Discharge: 2022-09-11 | Disposition: A | Discharge status: Home / Self Care | Payer: Medicare Other | Source: Ambulatory Visit | Attending: Hematology | Admitting: Hematology

## 2022-09-11 DIAGNOSIS — M810 Age-related osteoporosis without current pathological fracture: Secondary | ICD-10-CM | POA: Insufficient documentation

## 2022-09-11 DIAGNOSIS — Z13820 Encounter for screening for osteoporosis: Secondary | ICD-10-CM | POA: Insufficient documentation

## 2022-09-11 DIAGNOSIS — Z8546 Personal history of malignant neoplasm of prostate: Secondary | ICD-10-CM | POA: Diagnosis not present

## 2022-09-11 DIAGNOSIS — C9000 Multiple myeloma not having achieved remission: Secondary | ICD-10-CM | POA: Diagnosis not present

## 2022-09-12 ENCOUNTER — Other Ambulatory Visit: Payer: Self-pay

## 2022-09-17 ENCOUNTER — Other Ambulatory Visit: Payer: Self-pay

## 2022-09-18 ENCOUNTER — Other Ambulatory Visit: Payer: Self-pay

## 2022-09-18 DIAGNOSIS — C9000 Multiple myeloma not having achieved remission: Secondary | ICD-10-CM

## 2022-09-18 NOTE — Progress Notes (Signed)
[facility] [address] [place] [address]    Clinic Day:  09/18/2022  Referring physician: Benita Stabile, MD  Patient Care Team: Benita Stabile, MD as PCP - General (Internal Medicine) Wyline Mood Dorothe Pea, MD as Consulting Physician (Cardiology) Doreatha Massed, MD as Medical Oncologist (Medical Oncology)   ASSESSMENT & PLAN:   Assessment: IgG kappa smoldering multiple myeloma: - Skeletal survey in February 2022 was negative. - Bone marrow biopsy on 06/06/2020 with 20% plasma cells.  Myeloma FISH panel with monosomy 13/deletion 13.  Chromosome analysis 46, XY (20). - No "crab" features. - PET scan from 06/29/2021: No evidence of myeloma or plasmacytoma. - Skeletal survey on 06/13/2021: No lytic lesions. - MRI of the cervical/thoracic/lumbar spine negative for bone lesions with some degenerative changes. - BMBX (02/01/2022): Hypercellular marrow with plasma cells representing 35% of all cells.  They display kappa light chain restriction.  Cytogenetics-46, XY (20). - Myeloma FISH panel: Monosomy 13. - PET scan (04/05/2022): No suspicious bony lesions or soft tissue masses. - Daratumumab, Revlimid and dexamethasone started on 05/01/2022, could not tolerate Revlimid 15 mg dose as he had near syncope, Revlimid 15 mg stopped on 07/05/2022, dose decreased to 10 mg 3 weeks on/1 week off   2.  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 - No major chemical exposures.  He had personal history of melanoma of the upper lip. - Father had acute myeloid leukemia.  Mother had multiple myeloma.  Son died of acute lymphocytic leukemia    Plan: 1.  Stage II IgG kappa multiple myeloma, standard risk: - He is tolerating Revlimid very well. - Reviewed myeloma labs from 08/22/2022.  M spike improved to 0.2.  FLC ratio improved to normal. - Reviewed labs from today: Normal LFTs and creatinine.  CBC grossly normal. - Recommend  proceeding with cycle 6 today with Darzalex on day 1 and day 15.  After this cycle Darzalex will be every 28 days.  Continue Revlimid 10 mg 3 weeks on/1 week off and dexamethasone 20 mg weekly.  RTC 8 weeks with repeat myeloma panel.  2.  Peripheral neuropathy: - Continue gabapentin 300 mg 3 times daily.   3.  Prophylaxis: - Continue acyclovir 400 mg twice daily and aspirin 81 mg daily.   4.  Osteoporosis (DEXA 09/11/2022 T-score -3.5): - We reviewed results of bone density test which showed osteoporosis. - Recommend bisphosphonate therapy with either denosumab or Zometa based on his insurance preference.  We discussed that effects including hypocalcemia and rare chance of ONJ.  I have also recommended that he be evaluated by his dentist.   5.  Hypomagnesemia: - Continue magnesium 1 tablet daily.  Magnesium is normal at 1.7.    No orders of the defined types were placed in this encounter.     I,Katie Daubenspeck,acting as a Neurosurgeon for Doreatha Massed, MD.,have documented all relevant documentation on the behalf of Doreatha Massed, MD,as directed by  Doreatha Massed, MD while in the presence of Doreatha Massed, MD.   I, Doreatha Massed MD, have reviewed the above documentation for accuracy and completeness, and I agree with the above.   Mickie Bail   6/18/20248:19 PM  CHIEF COMPLAINT:   Diagnosis: IgG kappa multiple myeloma   Cancer Staging  Multiple myeloma (HCC) Staging form: Plasma Cell Myeloma and Plasma Cell Disorders, AJCC 8th Edition - Clinical stage from 04/11/2022: RISS Stage II (Beta-2-microglobulin (mg/L): 4.1, Albumin (g/dL):  2.7, ISS: Stage II, High-risk cytogenetics: Absent, LDH: Normal) - Signed by Doreatha Massed, MD on 04/11/2022    Prior Therapy: none  Current Therapy:  Daratumumab, Revlimid and dexamethasone    HISTORY OF PRESENT ILLNESS:   Oncology History  Multiple myeloma (HCC)  04/11/2022 Initial Diagnosis   Multiple  myeloma (HCC)   04/11/2022 Cancer Staging   Staging form: Plasma Cell Myeloma and Plasma Cell Disorders, AJCC 8th Edition - Clinical stage from 04/11/2022: RISS Stage II (Beta-2-microglobulin (mg/L): 4.1, Albumin (g/dL): 2.7, ISS: Stage II, High-risk cytogenetics: Absent, LDH: Normal) - Signed by Doreatha Massed, MD on 04/11/2022 Histopathologic type: Multiple myeloma Beta 2 microglobulin range (mg/L): 3.5 to 5.49 Albumin range (g/dL): Less than 3.5 Cytogenetics: No abnormalities   05/01/2022 -  Chemotherapy   Patient is on Treatment Plan : MYELOMA  Daratumumab SQ + Lenalidomide + Dexamethasone (DaraRd) q28d        INTERVAL HISTORY:   Candelario is a 77 y.o. male presenting to clinic today for follow up of IgG kappa multiple myeloma. He was last seen by me on 08/22/22.  Since his last visit, he underwent DEXA scan on 09/11/22 showing a T-score of -3.5, which is considered osteoporotic.  Today, he states that he is doing well overall. His appetite level is at 75%. His energy level is at 65%.  PAST MEDICAL HISTORY:   Past Medical History: Past Medical History:  Diagnosis Date   Adverse effect of unspecified drugs, medicaments and biological substances, initial encounter    Essential (primary) hypertension    Hyperlipidemia, unspecified    Impotence of organic origin    Malignant melanoma of skin, unspecified (HCC)    Malignant neoplasm of prostate (HCC)    Multiple myeloma (HCC)    Neuropathy    Non-pressure chronic ulcer of other part of unspecified foot with unspecified severity (HCC)    Reflux esophagitis    Restless legs syndrome    Tinea unguium    Type 2 diabetes mellitus with diabetic neuropathy, unspecified (HCC)    Type 2 diabetes mellitus with other specified complication (HCC)    Unspecified atrial fibrillation (HCC)     Surgical History: Past Surgical History:  Procedure Laterality Date   CATARACT EXTRACTION     HERNIA REPAIR     melanoma removal     PROSTATE  SURGERY      Social History: Social History   Socioeconomic History   Marital status: Married    Spouse name: Not on file   Number of children: Not on file   Years of education: Not on file   Highest education level: Not on file  Occupational History   Not on file  Tobacco Use   Smoking status: Never   Smokeless tobacco: Never  Vaping Use   Vaping Use: Never used  Substance and Sexual Activity   Alcohol use: No    Alcohol/week: 0.0 standard drinks of alcohol   Drug use: No   Sexual activity: Not Currently  Other Topics Concern   Not on file  Social History Narrative   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   Social Determinants of Health   Financial Resource Strain: Low Risk  (05/13/2020)   Overall Financial Resource Strain (CARDIA)    Difficulty of Paying Living Expenses: Not hard at all  Food Insecurity: No Food Insecurity (05/13/2020)   Hunger Vital Sign    Worried About Running Out of Food in the Last Year: Never true  Ran Out of Food in the Last Year: Never true  Transportation Needs: No Transportation Needs (05/13/2020)   PRAPARE - Administrator, Civil Service (Medical): No    Lack of Transportation (Non-Medical): No  Physical Activity: Insufficiently Active (05/13/2020)   Exercise Vital Sign    Days of Exercise per Week: 3 days    Minutes of Exercise per Session: 20 min  Stress: No Stress Concern Present (05/13/2020)   [employer] of Occupational Health - Occupational Stress Questionnaire    Feeling of Stress : Not at all  Social Connections: Moderately Integrated (05/13/2020)   Social Connection and Isolation Panel [NHANES]    Frequency of Communication with Friends and Family: Twice a week    Frequency of Social Gatherings with Friends and Family: Twice a week    Attends Religious Services: 1 to 4 times per year    Active Member of [employer] or Organizations: No    Attends Banker Meetings: Never     Marital Status: Married  Catering manager Violence: Not At Risk (05/13/2020)   Humiliation, Afraid, Rape, and Kick questionnaire    Fear of Current or Ex-Partner: No    Emotionally Abused: No    Physically Abused: No    Sexually Abused: No    Family History: Family History  Problem Relation Age of Onset   Multiple myeloma Mother    Acute myelogenous leukemia Father    Leukemia Son        acute lymphocytic leukemia    Current Medications:  Current Outpatient Medications:    ACCU-CHEK AVIVA PLUS test strip, USE 1 STRIP TO CHECK GLUCOSE TWICE DAILY, Disp: , Rfl:    Accu-Chek FastClix Lancets MISC, Apply topically 2 (two) times daily., Disp: , Rfl:    acyclovir (ZOVIRAX) 400 MG tablet, Take 1 tablet (400 mg total) by mouth 2 (two) times daily., Disp: 60 tablet, Rfl: 6   aspirin EC 81 MG tablet, Take 1 tablet (81 mg total) by mouth daily. Swallow whole., Disp: 30 tablet, Rfl: 12   atenolol-chlorthalidone (TENORETIC) 50-25 MG per tablet, Take 1 tablet by mouth daily. Takes 1/2 daily per Dr Juanetta Gosling, Disp: , Rfl:    cyanocobalamin (VITAMIN B12) 1000 MCG/ML injection, Inject 1,000 mcg into the muscle every 30 (thirty) days., Disp: , Rfl:    Daratumumab-Hyaluronidase-fihj (DARZALEX FASPRO [place]), Inject into the skin., Disp: , Rfl:    dexamethasone (DECADRON) 4 MG tablet, Take 5 tablets (20 mg total) by mouth once a week., Disp: 30 tablet, Rfl: 6   diclofenac sodium (VOLTAREN) 1 % GEL, Apply 2 g topically daily as needed., Disp: , Rfl:    gabapentin (NEURONTIN) 300 MG capsule, Take 300 mg by mouth 3 (three) times daily., Disp: , Rfl:    lenalidomide (REVLIMID) 10 MG capsule, Take 1 capsule (10 mg total) by mouth daily. 21 days on, 7 days off, Disp: 21 capsule, Rfl: 0   loperamide (IMODIUM A-D) 2 MG tablet, Take 2 mg by mouth 4 (four) times daily as needed for diarrhea or loose stools., Disp: , Rfl:    losartan (COZAAR) 50 MG tablet, Take 50 mg by mouth daily., Disp: , Rfl:    magnesium oxide  (MAG-OX) 400 (240 Mg) MG tablet, Take 1 tablet (400 mg total) by mouth 2 (two) times daily., Disp: 120 tablet, Rfl: 3   metFORMIN (GLUCOPHAGE) 500 MG tablet, Take 500 mg by mouth 2 (two) times daily with a meal., Disp: , Rfl:    pantoprazole (PROTONIX)  40 MG tablet, Take 40 mg by mouth daily., Disp: , Rfl:    prochlorperazine (COMPAZINE) 10 MG tablet, Take 1 tablet (10 mg total) by mouth every 6 (six) hours as needed for nausea or vomiting., Disp: 30 tablet, Rfl: 3   silver sulfADIAZINE (SILVADENE) 1 % cream, Apply topically., Disp: , Rfl:    Allergies: Allergies  Allergen Reactions   Penicillins Rash    Also swelling reaction    Gadavist [Gadobutrol] Nausea Only    Patient became nauseated immediately after contrast injection.  Was better after a few minutes.     REVIEW OF SYSTEMS:   Review of Systems  Constitutional:  Negative for chills, fatigue and fever.  HENT:   Negative for lump/mass, mouth sores, nosebleeds, sore throat and trouble swallowing.   Eyes:  Negative for eye problems.  Respiratory:  Positive for cough. Negative for shortness of breath.   Cardiovascular:  Negative for chest pain, leg swelling and palpitations.  Gastrointestinal:  Negative for abdominal pain, constipation, diarrhea, nausea and vomiting.  Genitourinary:  Negative for bladder incontinence, difficulty urinating, dysuria, frequency, hematuria and nocturia.   Musculoskeletal:  Negative for arthralgias, back pain, flank pain, myalgias and neck pain.  Skin:  Negative for itching and rash.  Neurological:  Positive for headaches and numbness. Negative for dizziness.  Hematological:  Does not bruise/bleed easily.  Psychiatric/Behavioral:  Positive for sleep disturbance. Negative for depression and suicidal ideas. The patient is not nervous/anxious.   All other systems reviewed and are negative.    VITALS:   There were no vitals taken for this visit.  Wt Readings from Last 3 Encounters:  09/05/22 177 lb 6.4  oz (80.5 kg)  08/22/22 180 lb (81.6 kg)  07/25/22 176 lb 4.8 oz (80 kg)    There is no height or weight on file to calculate BMI.  Performance status (ECOG): 1 - Symptomatic but completely ambulatory  PHYSICAL EXAM:   Physical Exam Vitals and nursing note reviewed. Exam conducted with a chaperone present.  Constitutional:      Appearance: Normal appearance.  Cardiovascular:     Rate and Rhythm: Normal rate and regular rhythm.     Pulses: Normal pulses.     Heart sounds: Normal heart sounds.  Pulmonary:     Effort: Pulmonary effort is normal.     Breath sounds: Normal breath sounds.  Abdominal:     Palpations: Abdomen is soft. There is no hepatomegaly, splenomegaly or mass.     Tenderness: There is no abdominal tenderness.  Musculoskeletal:     Right lower leg: No edema.     Left lower leg: No edema.  Lymphadenopathy:     Cervical: No cervical adenopathy.     Right cervical: No superficial, deep or posterior cervical adenopathy.    Left cervical: No superficial, deep or posterior cervical adenopathy.     Upper Body:     Right upper body: No supraclavicular or axillary adenopathy.     Left upper body: No supraclavicular or axillary adenopathy.  Neurological:     General: No focal deficit present.     Mental Status: He is alert and oriented to person, place, and time.  Psychiatric:        Mood and Affect: Mood normal.        Behavior: Behavior normal.     LABS:      Latest Ref Rng & Units 09/05/2022   12:08 PM 08/22/2022    9:34 AM 08/08/2022    9:42 AM  CBC  WBC 4.0 - 10.5 K/uL 6.4  6.3  8.6   Hemoglobin 13.0 - 17.0 g/dL 16.1  09.6  04.5   Hematocrit 39.0 - 52.0 % 37.3  38.6  40.2   Platelets 150 - 400 K/uL 227  251  217       Latest Ref Rng & Units 09/05/2022   12:08 PM 08/22/2022    9:34 AM 08/08/2022    9:42 AM  CMP  Glucose 70 - 99 mg/dL 409  811  914   BUN 8 - 23 mg/dL 16  16  17    Creatinine 0.61 - 1.24 mg/dL 7.82  9.56  2.13   Sodium 135 - 145 mmol/L 137   135  136   Potassium 3.5 - 5.1 mmol/L 3.3  3.4  4.2   Chloride 98 - 111 mmol/L 99  98  99   CO2 22 - 32 mmol/L 28  26  27    Calcium 8.9 - 10.3 mg/dL 8.6  8.4  8.7   Total Protein 6.5 - 8.1 g/dL 6.1  6.5  6.6   Total Bilirubin 0.3 - 1.2 mg/dL 1.1  0.8  0.7   Alkaline Phos 38 - 126 U/L 95  101  114   AST 15 - 41 U/L 12  14  14    ALT 0 - 44 U/L 13  12  14       No results found for: "CEA1", "CEA" / No results found for: "CEA1", "CEA" No results found for: "PSA1" No results found for: "YQM578" No results found for: "ION629"  Lab Results  Component Value Date   TOTALPROTELP 5.4 (L) 08/22/2022   ALBUMINELP 3.1 08/22/2022   A1GS 0.2 08/22/2022   A2GS 0.7 08/22/2022   BETS 0.7 08/22/2022   GAMS 0.7 08/22/2022   MSPIKE 0.2 (H) 08/22/2022   SPEI Comment 08/22/2022   No results found for: "TIBC", "FERRITIN", "IRONPCTSAT" Lab Results  Component Value Date   LDH 89 (L) 04/05/2022   LDH 93 (L) 01/01/2022   LDH 107 10/04/2021     STUDIES:   DG Bone Density  Result Date: 09/11/2022 EXAM: DUAL X-RAY ABSORPTIOMETRY (DXA) FOR BONE MINERAL DENSITY IMPRESSION: Your patient Atha Elizalde completed a BMD test on 09/11/2022 using the [employer] DXA System (software version: 14.10) manufactured by [employer]. The following summarizes the results of our evaluation. Technologist: [facility] PATIENT BIOGRAPHICAL: Name: Gerad, Crutcher Patient ID: [id number] Birth Date: 1945-06-01 Height: 68.0 in. Gender: Male Exam Date: 09/11/2022 Weight: 177.4 lbs. Indications: Height Loss, History of Fracture (Adult), Prostate Cancer Fractures: Toe Treatments: Asprin DENSITOMETRY RESULTS: Site      Region     Measured Date Measured Age WHO Classification Young Adult T-score BMD         %Change vs. Previous Significant Change (*) AP Spine L1-L2 09[age]11/2021 77.8 N/A -2.0 0.965 g/cm2 - - DualFemur Neck Right 09/11/2021 77.8 N/A -3.5 0.612 g/cm2 - - DualFemur Total Mean 09/11/2022 77.8 N/A -3.0 0.666 g/cm2 -  - ASSESSMENT: BMD as determined from Femur Neck Right is 0.612 g/cm2 with a T-score of -3.5. This patient is considered osteoporotic by World [facility]al[facility]) Criteria. The scan quality is good. L3 and L4 were excluded due to advanced degenerative changes. World Science writer [facility]) criteria for post-menopausal, Caucasian Women: Normal:       T-score at or above -1 SD Osteopenia:   T-score between -1 and -2.5 SD Osteoporosis: T-score at or below -2.5 SD RECOMMENDATIONS: 1. All  patients should optimize calcium and vitamin D intake. 2. Consider FDA-approved medical therapies in postmenopausal women and med aged 88 years and older, based on the following: a. A hip or vertebral (clinical or morphometric) fracture b. T-score< -2.5 at the femoral neck or spine after appropriate evaluation to exclude secondary causes c. Low bone mass (T-score between -1.0 and -2.5 at the femoral neck or spine) and a 10-year probability of a hip fracture > 3% or a 10-year probability of a major osteoporosis-related fracture > 20% based on the [place]-adapted WHO algorithm d. Clinician judgment and/or patient preferences may indicate treatment for people with 10-year fracture probabilities above or below these levels FOLLOW-UP: People with diagnosed cases of osteoporosis or osteopenia should be regularly tested for bone mineral density. For patients eligible for Medicare, routine testing is allowed once every 2 years. Testing frequency can be increased for patients who have rapidly progressing disease, or for those who are receiving medical therapy to restore bone mass. I have reviewed this report, and agree with the above findings. [facility] Electronically Signed   By: Frederico Hamman M.D.   On: 09/11/2022 09:41

## 2022-09-19 ENCOUNTER — Inpatient Hospital Stay: Payer: Medicare Other

## 2022-09-19 ENCOUNTER — Inpatient Hospital Stay: Payer: Medicare Other | Admitting: Hematology

## 2022-09-19 DIAGNOSIS — C9000 Multiple myeloma not having achieved remission: Secondary | ICD-10-CM

## 2022-09-19 DIAGNOSIS — Z5112 Encounter for antineoplastic immunotherapy: Secondary | ICD-10-CM | POA: Diagnosis not present

## 2022-09-19 LAB — CBC WITH DIFFERENTIAL/PLATELET
Abs Immature Granulocytes: 0.02 10*3/uL (ref 0.00–0.07)
Basophils Absolute: 0.1 10*3/uL (ref 0.0–0.1)
Basophils Relative: 1 %
Eosinophils Absolute: 0.6 10*3/uL — ABNORMAL HIGH (ref 0.0–0.5)
Eosinophils Relative: 9 %
HCT: 39.2 % (ref 39.0–52.0)
Hemoglobin: 13.4 g/dL (ref 13.0–17.0)
Immature Granulocytes: 0 %
Lymphocytes Relative: 24 %
Lymphs Abs: 1.7 10*3/uL (ref 0.7–4.0)
MCH: 30.9 pg (ref 26.0–34.0)
MCHC: 34.2 g/dL (ref 30.0–36.0)
MCV: 90.5 fL (ref 80.0–100.0)
Monocytes Absolute: 1 10*3/uL (ref 0.1–1.0)
Monocytes Relative: 14 %
Neutro Abs: 3.7 10*3/uL (ref 1.7–7.7)
Neutrophils Relative %: 52 %
Platelets: 231 10*3/uL (ref 150–400)
RBC: 4.33 MIL/uL (ref 4.22–5.81)
RDW: 15.2 % (ref 11.5–15.5)
WBC: 7.1 10*3/uL (ref 4.0–10.5)
nRBC: 0 % (ref 0.0–0.2)

## 2022-09-19 LAB — CMP (CANCER CENTER ONLY)
ALT: 13 U/L (ref 0–44)
AST: 14 U/L — ABNORMAL LOW (ref 15–41)
Albumin: 3.6 g/dL (ref 3.5–5.0)
Alkaline Phosphatase: 96 U/L (ref 38–126)
Anion gap: 7 (ref 5–15)
BUN: 19 mg/dL (ref 8–23)
CO2: 28 mmol/L (ref 22–32)
Calcium: 8.7 mg/dL — ABNORMAL LOW (ref 8.9–10.3)
Chloride: 100 mmol/L (ref 98–111)
Creatinine: 1.13 mg/dL (ref 0.61–1.24)
GFR, Estimated: >60 mL/min (ref >60)
Glucose, Bld: 150 mg/dL — ABNORMAL HIGH (ref 70–99)
Potassium: 3.6 mmol/L (ref 3.5–5.1)
Sodium: 135 mmol/L (ref 135–145)
Total Bilirubin: 1.1 mg/dL (ref 0.3–1.2)
Total Protein: 6.4 g/dL — ABNORMAL LOW (ref 6.5–8.1)

## 2022-09-19 LAB — MAGNESIUM: Magnesium: 1.7 mg/dL (ref 1.7–2.4)

## 2022-09-19 MED ORDER — DARATUMUMAB-HYALURONIDASE-FIHJ 1800-30000 MG-UT/15ML ~~LOC~~ SOLN
1800.0000 mg | Freq: Once | SUBCUTANEOUS | Status: AC
  Administered 2022-09-19: 1800 mg via SUBCUTANEOUS
  Filled 2022-09-19: qty 15

## 2022-09-19 NOTE — Patient Instructions (Signed)
[facility] [place]  Discharge Instructions: Thank you for choosing [facility] to provide your oncology and hematology care.  If you have a lab appointment with the Cancer Center - please note that after April 8th, 2024, all labs will be drawn in the cancer center.  You do not have to check in or register with the main entrance as you have in the past but will complete your check-in in the cancer center.  Wear comfortable clothing and clothing appropriate for easy access to any Portacath or PICC line.   We strive to give you quality time with your provider. You may need to reschedule your appointment if you arrive late (15 or more minutes).  Arriving late affects you and other patients whose appointments are after yours.  Also, if you miss three or more appointments without notifying the office, you may be dismissed from the clinic at the provider's discretion.      For prescription refill requests, have your pharmacy contact our office and allow 72 hours for refills to be completed.    Today you received the following chemotherapy and/or immunotherapy agents Dara [place]   To help prevent nausea and vomiting after your treatment, we encourage you to take your nausea medication as directed.  Daratumumab; Hyaluronidase Injection What is this medication? DARATUMUMAB; HYALURONIDASE (dar a toom ue mab; hye al ur ON i dase) treats multiple myeloma, a type of bone marrow cancer. Daratumumab works by blocking a protein that causes cancer cells to grow and multiply. This helps to slow or stop the spread of cancer cells. Hyaluronidase works by increasing the absorption of other medications in the body to help them work better. This medication may also be used treat amyloidosis, a condition that causes the buildup of a protein (amyloid) in your body. It works by reducing the buildup of this protein, which decreases symptoms. It is a combination medication that contains a monoclonal  antibody. This medicine may be used for other purposes; ask your health care provider or pharmacist if you have questions. COMMON BRAND NAME(S): DARZALEX FASPRO What should I tell my care team before I take this medication? They need to know if you have any of these conditions: Heart disease Infection, such as chickenpox, cold sores, herpes, hepatitis B Lung or breathing disease An unusual or allergic reaction to daratumumab, hyaluronidase, other medications, foods, dyes, or preservatives Pregnant or trying to get pregnant Breast-feeding How should I use this medication? This medication is injected under the skin. It is given by your care team in a hospital or clinic setting. Talk to your care team about the use of this medication in children. Special care may be needed. Overdosage: If you think you have taken too much of this medicine contact a poison control center or emergency room at once. NOTE: This medicine is only for you. Do not share this medicine with others. What if I miss a dose? Keep appointments for follow-up doses. It is important not to miss your dose. Call your care team if you are unable to keep an appointment. What may interact with this medication? Interactions have not been studied. This list may not describe all possible interactions. Give your health care provider a list of all the medicines, herbs, non-prescription drugs, or dietary supplements you use. Also tell them if you smoke, drink alcohol, or use illegal drugs. Some items may interact with your medicine. What should I watch for while using this medication? Your condition will be monitored   carefully while you are receiving this medication. This medication can cause serious allergic reactions. To reduce your risk, your care team may give you other medication to take before receiving this one. Be sure to follow the directions from your care team. This medication can affect the results of blood tests to match your  blood type. These changes can last for up to 6 months after the final dose. Your care team will do blood tests to match your blood type before you start treatment. Tell all of your care team that you are being treated with this medication before receiving a blood transfusion. This medication can affect the results of some tests used to determine treatment response; extra tests may be needed to evaluate response. Talk to your care team if you wish to become pregnant or think you are pregnant. This medication can cause serious birth defects if taken during pregnancy and for 3 months after the last dose. A reliable form of contraception is recommended while taking this medication and for 3 months after the last dose. Talk to your care team about effective forms of contraception. Do not breast-feed while taking this medication. What side effects may I notice from receiving this medication? Side effects that you should report to your care team as soon as possible: Allergic reactions--skin rash, itching, hives, swelling of the face, lips, tongue, or throat Heart rhythm changes--fast or irregular heartbeat, dizziness, feeling faint or lightheaded, chest pain, trouble breathing Infection--fever, chills, cough, sore throat, wounds that don't heal, pain or trouble when passing urine, general feeling of discomfort or being unwell Infusion reactions--chest pain, shortness of breath or trouble breathing, feeling faint or lightheaded Sudden eye pain or change in vision such as blurry vision, seeing halos around lights, vision loss Unusual bruising or bleeding Side effects that usually do not require medical attention (report to your care team if they continue or are bothersome): Constipation Diarrhea Fatigue Nausea Pain, tingling, or numbness in the hands or feet Swelling of the ankles, hands, or feet This list may not describe all possible side effects. Call your doctor for medical advice about side effects.  You may report side effects to FDA at 1-800-FDA-1088. Where should I keep my medication? This medication is given in a hospital or clinic. It will not be stored at home. NOTE: This sheet is a summary. It may not cover all possible information. If you have questions about this medicine, talk to your doctor, pharmacist, or health care provider.  2024 Elsevier/Gold Standard (2021-07-25 00:00:00)   BELOW ARE SYMPTOMS THAT SHOULD BE REPORTED IMMEDIATELY: *FEVER GREATER THAN 100.4 F (38 C) OR HIGHER *CHILLS OR SWEATING *NAUSEA AND VOMITING THAT IS NOT CONTROLLED WITH YOUR NAUSEA MEDICATION *UNUSUAL SHORTNESS OF BREATH *UNUSUAL BRUISING OR BLEEDING *URINARY PROBLEMS (pain or burning when urinating, or frequent urination) *BOWEL PROBLEMS (unusual diarrhea, constipation, pain near the anus) TENDERNESS IN MOUTH AND THROAT WITH OR WITHOUT PRESENCE OF ULCERS (sore throat, sores in mouth, or a toothache) UNUSUAL RASH, SWELLING OR PAIN  UNUSUAL VAGINAL DISCHARGE OR ITCHING   Items with * indicate a potential emergency and should be followed up as soon as possible or go to the Emergency Department if any problems should occur.  Please show the CHEMOTHERAPY ALERT CARD or IMMUNOTHERAPY ALERT CARD at check-in to the Emergency Department and triage nurse.  Should you have questions after your visit or need to cancel or reschedule your appointment, please contact [facility] [phone]  and follow the prompts.  Office hours are 8:00 a.m. to 4:30 p.m. Monday - Friday. Please note that voicemails left after 4:00 p.m. may not be returned until the following business day.  We are closed weekends and major holidays. You have access to a nurse at all times for urgent questions. Please call the main number to the clinic [phone] and follow the prompts.  For any non-urgent questions, you may also contact your provider using MyChart. We now offer e-Visits for anyone 66 and older to request  care online for non-urgent symptoms. For details visit [email or web address].   Also download the MyChart app! Go to the app store, search "MyChart", open the app, select [place], and log in with your MyChart username and password.

## 2022-09-19 NOTE — Progress Notes (Signed)
Patient presents today for Dara [place] infusion. Patient is in satisfactory condition with no new complaints voiced.  Vital signs are stable.  Labs reviewed by Dr. Ellin Saba during the office visit and all labs are within treatment parameters.  We will proceed with treatment per MD orders.   Pt took pre-meds at home prior to arrival.  [facility] given today per MD orders. Tolerated infusion without adverse affects. Vital signs stable. No complaints at this time. Discharged from clinic ambulatory in stable condition. Alert and oriented x 3. F/U with [facility] as scheduled.

## 2022-09-19 NOTE — Progress Notes (Signed)
Patient is taking Revlimid as prescribed.  He has not missed any doses and reports no side effects at this time.    Patient has been examined by Dr. Katragadda. Vital signs and labs have been reviewed by MD - ANC, Creatinine, LFTs, hemoglobin, and platelets are within treatment parameters per M.D. - pt may proceed with treatment.  Primary RN and pharmacy notified.  

## 2022-09-19 NOTE — Patient Instructions (Addendum)
[facility] Discharge Instructions   You were seen and examined today by Dr. Ellin Saba.  He reviewed the results of your lab work which are normal/stable.   He reviewed the results of your bone density test which shows osteoporosis. Dr. Kirtland Bouchard recommends starting you on a bone strengthening injection. Depending on what your insurance will cover, this will be an injection called Xgeva or an infusion called Zometa. Either of these is given every 4 weeks. The Zometa is a 15 minute infusion. You will have to have clearance by a dentist prior to [place] giving you this medication. You will also need to take calcium supplements twice a day.   We will proceed with your treatment today.   Return as scheduled.    Thank you for choosing [facility] at [facility] to provide your oncology and hematology care.  To afford each patient quality time with our provider, please arrive at least 15 minutes before your scheduled appointment time.   If you have a lab appointment with the Cancer Center please come in thru the Main Entrance and check in at the main information desk.  You need to re-schedule your appointment should you arrive 10 or more minutes late.  We strive to give you quality time with our providers, and arriving late affects you and other patients whose appointments are after yours.  Also, if you no show three or more times for appointments you may be dismissed from the clinic at the providers discretion.     Again, thank you for choosing [facility].  Our hope is that these requests will decrease the amount of time that you wait before being seen by our physicians.       _____________________________________________________________  Should you have questions after your visit to [facility], please contact our office at [phone] and follow the prompts.  Our office hours are 8:00 a.m. and 4:30 p.m. Monday - Friday.   Please note that voicemails left after 4:00 p.m. may not be returned until the following business day.  We are closed weekends and major holidays.  You do have access to a nurse 24-7, just call the main number to the clinic [phone] and do not press any options, hold on the line and a nurse will answer the phone.    For prescription refill requests, have your pharmacy contact our office and allow 72 hours.    Due to Covid, you will need to wear a mask upon entering the hospital. If you do not have a mask, a mask will be given to you at the Main Entrance upon arrival. For doctor visits, patients may have 1 support person age 75 or older with them. For treatment visits, patients can not have anyone with them due to social distancing guidelines and our immunocompromised population.

## 2022-09-20 ENCOUNTER — Other Ambulatory Visit: Payer: Self-pay

## 2022-09-20 LAB — KAPPA/LAMBDA LIGHT CHAINS
Kappa free light chain: 17.8 mg/L (ref 3.3–19.4)
Kappa, lambda light chain ratio: 2 — ABNORMAL HIGH (ref 0.26–1.65)
Lambda free light chains: 8.9 mg/L (ref 5.7–26.3)

## 2022-09-21 LAB — PROTEIN ELECTROPHORESIS, SERUM
A/G Ratio: 1.5 (ref 0.7–1.7)
Albumin ELP: 3.4 g/dL (ref 2.9–4.4)
Alpha-1-Globulin: 0.2 g/dL (ref 0.0–0.4)
Alpha-2-Globulin: 0.7 g/dL (ref 0.4–1.0)
Beta Globulin: 0.8 g/dL (ref 0.7–1.3)
Gamma Globulin: 0.6 g/dL (ref 0.4–1.8)
Globulin, Total: 2.2 g/dL (ref 2.2–3.9)
M-Spike, %: 0.2 g/dL — ABNORMAL HIGH
Total Protein ELP: 5.6 g/dL — ABNORMAL LOW (ref 6.0–8.5)

## 2022-10-01 ENCOUNTER — Other Ambulatory Visit: Payer: Self-pay

## 2022-10-01 DIAGNOSIS — C9000 Multiple myeloma not having achieved remission: Secondary | ICD-10-CM

## 2022-10-03 ENCOUNTER — Inpatient Hospital Stay: Payer: Medicare Other

## 2022-10-03 ENCOUNTER — Ambulatory Visit: Payer: Medicare Other | Admitting: Hematology

## 2022-10-03 ENCOUNTER — Inpatient Hospital Stay: Payer: Medicare Other | Attending: Hematology

## 2022-10-03 VITALS — BP 126/63 | HR 92 | Temp 97.4°F | Resp 18 | Wt 178.1 lb

## 2022-10-03 DIAGNOSIS — Z5112 Encounter for antineoplastic immunotherapy: Secondary | ICD-10-CM | POA: Insufficient documentation

## 2022-10-03 DIAGNOSIS — C9000 Multiple myeloma not having achieved remission: Secondary | ICD-10-CM | POA: Diagnosis not present

## 2022-10-03 LAB — CBC WITH DIFFERENTIAL/PLATELET
Abs Immature Granulocytes: 0.04 10*3/uL (ref 0.00–0.07)
Basophils Absolute: 0.1 10*3/uL (ref 0.0–0.1)
Basophils Relative: 1 %
Eosinophils Absolute: 0.5 10*3/uL (ref 0.0–0.5)
Eosinophils Relative: 7 %
HCT: 40.1 % (ref 39.0–52.0)
Hemoglobin: 13.5 g/dL (ref 13.0–17.0)
Immature Granulocytes: 1 %
Lymphocytes Relative: 13 %
Lymphs Abs: 0.9 10*3/uL (ref 0.7–4.0)
MCH: 30.7 pg (ref 26.0–34.0)
MCHC: 33.7 g/dL (ref 30.0–36.0)
MCV: 91.1 fL (ref 80.0–100.0)
Monocytes Absolute: 0.7 10*3/uL (ref 0.1–1.0)
Monocytes Relative: 10 %
Neutro Abs: 4.8 10*3/uL (ref 1.7–7.7)
Neutrophils Relative %: 68 %
Platelets: 219 10*3/uL (ref 150–400)
RBC: 4.4 MIL/uL (ref 4.22–5.81)
RDW: 15.3 % (ref 11.5–15.5)
WBC: 7 10*3/uL (ref 4.0–10.5)
nRBC: 0 % (ref 0.0–0.2)

## 2022-10-03 LAB — COMPREHENSIVE METABOLIC PANEL
ALT: 12 U/L (ref 0–44)
AST: 14 U/L — ABNORMAL LOW (ref 15–41)
Albumin: 3.8 g/dL (ref 3.5–5.0)
Alkaline Phosphatase: 94 U/L (ref 38–126)
Anion gap: 11 (ref 5–15)
BUN: 18 mg/dL (ref 8–23)
CO2: 27 mmol/L (ref 22–32)
Calcium: 8.7 mg/dL — ABNORMAL LOW (ref 8.9–10.3)
Chloride: 98 mmol/L (ref 98–111)
Creatinine, Ser: 1.2 mg/dL (ref 0.61–1.24)
GFR, Estimated: >60 mL/min (ref >60)
Glucose, Bld: 201 mg/dL — ABNORMAL HIGH (ref 70–99)
Potassium: 3.5 mmol/L (ref 3.5–5.1)
Sodium: 136 mmol/L (ref 135–145)
Total Bilirubin: 1.2 mg/dL (ref 0.3–1.2)
Total Protein: 6.5 g/dL (ref 6.5–8.1)

## 2022-10-03 LAB — MAGNESIUM: Magnesium: 1.7 mg/dL (ref 1.7–2.4)

## 2022-10-03 MED ORDER — DARATUMUMAB-HYALURONIDASE-FIHJ 1800-30000 MG-UT/15ML ~~LOC~~ SOLN
1800.0000 mg | Freq: Once | SUBCUTANEOUS | Status: AC
  Administered 2022-10-03: 1800 mg via SUBCUTANEOUS
  Filled 2022-10-03: qty 15

## 2022-10-03 NOTE — Progress Notes (Signed)
Patient presents today for chemotherapy infusion/injection of Dara.  Patient is in satisfactory condition with no new complaints voiced.  Vital signs are stable. Patient took premeds of Tylenol 650mg  and Zyrtec 10mg  prior to coming for treatment at approx 1030am. Labs reviewed and all labs are within treatment parameters. We will proceed with treatment per MD orders.  Patient tolerated treatment well with no complaints voiced.  Patient left ambulatory in stable condition.  Vital signs stable at discharge.  Follow up as scheduled.

## 2022-10-03 NOTE — Patient Instructions (Signed)
[facility]  Discharge Instructions: Thank you for choosing [facility] to provide your oncology and hematology care.  If you have a lab appointment with the Cancer Center - please note that after April 8th, 2024, all labs will be drawn in the cancer center.  You do not have to check in or register with the main entrance as you have in the past but will complete your check-in in the cancer center.  Wear comfortable clothing and clothing appropriate for easy access to any Portacath or PICC line.   We strive to give you quality time with your provider. You may need to reschedule your appointment if you arrive late (15 or more minutes).  Arriving late affects you and other patients whose appointments are after yours.  Also, if you miss three or more appointments without notifying the office, you may be dismissed from the clinic at the provider's discretion.      For prescription refill requests, have your pharmacy contact our office and allow 72 hours for refills to be completed.    Today you received the following chemotherapy and/or immunotherapy agents Dara [place].  Daratumumab Injection What is this medication? DARATUMUMAB (dar a toom ue mab) treats multiple myeloma, a type of bone marrow cancer. It works by helping your immune system slow or stop the spread of cancer cells. It is a monoclonal antibody. This medicine may be used for other purposes; ask your health care provider or pharmacist if you have questions. COMMON BRAND NAME(S): DARZALEX What should I tell my care team before I take this medication? They need to know if you have any of these conditions: Hereditary fructose intolerance Infection, such as chickenpox, herpes, hepatitis B Lung or breathing disease, such as asthma, COPD An unusual or allergic reaction to daratumumab, sorbitol, other medications, foods, dyes, or preservatives Pregnant or trying to get pregnant Breastfeeding How should I use  this medication? This medication is injected into a vein. It is given by your care team in a hospital or clinic setting. Talk to your care team about the use of this medication in children. Special care may be needed. Overdosage: If you think you have taken too much of this medicine contact a poison control center or emergency room at once. NOTE: This medicine is only for you. Do not share this medicine with others. What if I miss a dose? Keep appointments for follow-up doses. It is important not to miss your dose. Call your care team if you are unable to keep an appointment. What may interact with this medication? Interactions have not been studied. This list may not describe all possible interactions. Give your health care provider a list of all the medicines, herbs, non-prescription drugs, or dietary supplements you use. Also tell them if you smoke, drink alcohol, or use illegal drugs. Some items may interact with your medicine. What should I watch for while using this medication? Your condition will be monitored carefully while you are receiving this medication. This medication can cause serious allergic reactions. To reduce your risk, your care team may give you other medication to take before receiving this one. Be sure to follow the directions from your care team. This medication can affect the results of blood tests to match your blood type. These changes can last for up to 6 months after the final dose. Your care team will do blood tests to match your blood type before you start treatment. Tell all of your care team that  you are being treated with this medication before receiving a blood transfusion. This medication can affect the results of some tests used to determine treatment response; extra tests may be needed to evaluate response. Talk to your care team if you wish to become pregnant or think you are pregnant. This medication can cause serious birth defects if taken during pregnancy and  for 3 months after the last dose. A reliable form of contraception is recommended while taking this medication and for 3 months after the last dose. Talk to your care team about effective forms of contraception. Do not breast-feed while taking this medication. What side effects may I notice from receiving this medication? Side effects that you should report to your care team as soon as possible: Allergic reactions--skin rash, itching, hives, swelling of the face, lips, tongue, or throat Infection--fever, chills, cough, sore throat, wounds that don't heal, pain or trouble when passing urine, general feeling of discomfort or being unwell Infusion reactions--chest pain, shortness of breath or trouble breathing, feeling faint or lightheaded Unusual bruising or bleeding Side effects that usually do not require medical attention (report to your care team if they continue or are bothersome): Constipation Diarrhea Fatigue Nausea Pain, tingling, or numbness in the hands or feet Swelling of the ankles, hands, or feet This list may not describe all possible side effects. Call your doctor for medical advice about side effects. You may report side effects to FDA at 1-800-FDA-1088. Where should I keep my medication? This medication is given in a hospital or clinic. It will not be stored at home. NOTE: This sheet is a summary. It may not cover all possible information. If you have questions about this medicine, talk to your doctor, pharmacist, or health care provider.  2024 Elsevier/Gold Standard (2022-01-25 00:00:00)    To help prevent nausea and vomiting after your treatment, we encourage you to take your nausea medication as directed.  BELOW ARE SYMPTOMS THAT SHOULD BE REPORTED IMMEDIATELY: *FEVER GREATER THAN 100.4 F (38 C) OR HIGHER *CHILLS OR SWEATING *NAUSEA AND VOMITING THAT IS NOT CONTROLLED WITH YOUR NAUSEA MEDICATION *UNUSUAL SHORTNESS OF BREATH *UNUSUAL BRUISING OR BLEEDING *URINARY  PROBLEMS (pain or burning when urinating, or frequent urination) *BOWEL PROBLEMS (unusual diarrhea, constipation, pain near the anus) TENDERNESS IN MOUTH AND THROAT WITH OR WITHOUT PRESENCE OF ULCERS (sore throat, sores in mouth, or a toothache) UNUSUAL RASH, SWELLING OR PAIN  UNUSUAL VAGINAL DISCHARGE OR ITCHING   Items with * indicate a potential emergency and should be followed up as soon as possible or go to the Emergency Department if any problems should occur.  Please show the CHEMOTHERAPY ALERT CARD or IMMUNOTHERAPY ALERT CARD at check-in to the Emergency Department and triage nurse.  Should you have questions after your visit or need to cancel or reschedule your appointment, please contact [facility] [phone]  and follow the prompts.  Office hours are 8:00 a.m. to 4:30 p.m. Monday - Friday. Please note that voicemails left after 4:00 p.m. may not be returned until the following business day.  We are closed weekends and major holidays. You have access to a nurse at all times for urgent questions. Please call the main number to the clinic [phone] and follow the prompts.  For any non-urgent questions, you may also contact your provider using MyChart. We now offer e-Visits for anyone 79 and older to request care online for non-urgent symptoms. For details visit [email or web address].   Also download the MyChart app! Go to  the app store, search "MyChart", open the app, select [place], and log in with your MyChart username and password.

## 2022-10-04 ENCOUNTER — Other Ambulatory Visit: Payer: Self-pay

## 2022-10-05 ENCOUNTER — Other Ambulatory Visit: Payer: Self-pay

## 2022-10-08 ENCOUNTER — Other Ambulatory Visit: Payer: Self-pay

## 2022-10-08 MED ORDER — LENALIDOMIDE 10 MG PO CAPS: 10.0000 mg | ORAL_CAPSULE | Freq: Every day | ORAL | 0 refills | Status: DC

## 2022-10-08 NOTE — Telephone Encounter (Signed)
Chart reviewed. Revlimid refilled per last office note with Dr. Katragadda.  

## 2022-10-10 DIAGNOSIS — S92302D Fracture of unspecified metatarsal bone(s), left foot, subsequent encounter for fracture with routine healing: Secondary | ICD-10-CM | POA: Diagnosis not present

## 2022-10-17 ENCOUNTER — Ambulatory Visit: Payer: Medicare Other

## 2022-10-17 ENCOUNTER — Other Ambulatory Visit: Payer: Medicare Other

## 2022-10-18 ENCOUNTER — Inpatient Hospital Stay: Payer: Medicare Other

## 2022-10-18 ENCOUNTER — Ambulatory Visit: Payer: Medicare Other | Admitting: Hematology

## 2022-10-18 ENCOUNTER — Inpatient Hospital Stay: Payer: Medicare Other | Admitting: Hematology

## 2022-10-18 ENCOUNTER — Other Ambulatory Visit: Payer: Medicare Other

## 2022-10-18 ENCOUNTER — Ambulatory Visit: Payer: Medicare Other

## 2022-10-29 ENCOUNTER — Telehealth: Payer: Self-pay | Admitting: *Deleted

## 2022-10-29 NOTE — Telephone Encounter (Signed)
Patient called to advise that he has developed a full body rash that is not hot to touch or itching, just very red.  Is currently in [place].  States that he has spent minimal time in the sun and rash was present previous to him leaving on vacation, however has gotten worse, to include swelling in bilateral legs.  Made him aware that he needs to avoid the sun as much as possible, continue hydrocortisone cream and may hold Revlimid, but will need to wait until August 13 th to restart next cycle.  Also, he would need to report to Urgent care or ER for evaluation of the rash and stated swelling to bilateral legs.  Verbalized that he would do so.

## 2022-10-31 ENCOUNTER — Inpatient Hospital Stay: Payer: Medicare Other

## 2022-11-06 ENCOUNTER — Telehealth: Payer: Self-pay | Admitting: *Deleted

## 2022-11-06 ENCOUNTER — Other Ambulatory Visit: Payer: Self-pay

## 2022-11-06 MED ORDER — LENALIDOMIDE 10 MG PO CAPS: 10.0000 mg | ORAL_CAPSULE | Freq: Every day | ORAL | 0 refills | Status: DC

## 2022-11-06 NOTE — Telephone Encounter (Signed)
Chart reviewed. Revlimid refilled per last office note with Dr. Katragadda.  

## 2022-11-06 NOTE — Telephone Encounter (Signed)
Discussed restarting Revlimid with Dr. Ellin Saba, as rash subsided 4 days after stopping medication.  Message left for patient advising that he resume today.  Patient here for treatment 8/7 and photos of rash shown to Dr. Ellin Saba.  Per MD, he would like for him to do another trial and let us know if rash returns.

## 2022-11-07 ENCOUNTER — Inpatient Hospital Stay: Payer: Medicare Other | Attending: Hematology

## 2022-11-07 ENCOUNTER — Inpatient Hospital Stay: Payer: Medicare Other

## 2022-11-07 VITALS — BP 131/83 | HR 81 | Temp 96.6°F | Resp 20 | Wt 176.8 lb

## 2022-11-07 DIAGNOSIS — C9000 Multiple myeloma not having achieved remission: Secondary | ICD-10-CM

## 2022-11-07 DIAGNOSIS — Z5112 Encounter for antineoplastic immunotherapy: Secondary | ICD-10-CM | POA: Insufficient documentation

## 2022-11-07 LAB — CMP (CANCER CENTER ONLY)
ALT: 16 U/L (ref 0–44)
AST: 14 U/L — ABNORMAL LOW (ref 15–41)
Albumin: 3.7 g/dL (ref 3.5–5.0)
Alkaline Phosphatase: 101 U/L (ref 38–126)
Anion gap: 8 (ref 5–15)
BUN: 17 mg/dL (ref 8–23)
CO2: 29 mmol/L (ref 22–32)
Calcium: 8.9 mg/dL (ref 8.9–10.3)
Chloride: 101 mmol/L (ref 98–111)
Creatinine: 1.12 mg/dL (ref 0.61–1.24)
GFR, Estimated: >60 mL/min (ref >60)
Glucose, Bld: 230 mg/dL — ABNORMAL HIGH (ref 70–99)
Potassium: 3.3 mmol/L — ABNORMAL LOW (ref 3.5–5.1)
Sodium: 138 mmol/L (ref 135–145)
Total Bilirubin: 0.8 mg/dL (ref 0.3–1.2)
Total Protein: 6.4 g/dL — ABNORMAL LOW (ref 6.5–8.1)

## 2022-11-07 LAB — CBC WITH DIFFERENTIAL/PLATELET
Abs Immature Granulocytes: 0.02 10*3/uL (ref 0.00–0.07)
Basophils Absolute: 0.1 10*3/uL (ref 0.0–0.1)
Basophils Relative: 1 %
Eosinophils Absolute: 0.6 10*3/uL — ABNORMAL HIGH (ref 0.0–0.5)
Eosinophils Relative: 11 %
HCT: 38.1 % — ABNORMAL LOW (ref 39.0–52.0)
Hemoglobin: 12.8 g/dL — ABNORMAL LOW (ref 13.0–17.0)
Immature Granulocytes: 0 %
Lymphocytes Relative: 32 %
Lymphs Abs: 1.8 10*3/uL (ref 0.7–4.0)
MCH: 30.9 pg (ref 26.0–34.0)
MCHC: 33.6 g/dL (ref 30.0–36.0)
MCV: 92 fL (ref 80.0–100.0)
Monocytes Absolute: 0.8 10*3/uL (ref 0.1–1.0)
Monocytes Relative: 13 %
Neutro Abs: 2.5 10*3/uL (ref 1.7–7.7)
Neutrophils Relative %: 43 %
Platelets: 284 10*3/uL (ref 150–400)
RBC: 4.14 MIL/uL — ABNORMAL LOW (ref 4.22–5.81)
RDW: 14.7 % (ref 11.5–15.5)
WBC: 5.7 10*3/uL (ref 4.0–10.5)
nRBC: 0 % (ref 0.0–0.2)

## 2022-11-07 LAB — MAGNESIUM: Magnesium: 1.6 mg/dL — ABNORMAL LOW (ref 1.7–2.4)

## 2022-11-07 MED ORDER — DARATUMUMAB-HYALURONIDASE-FIHJ 1800-30000 MG-UT/15ML ~~LOC~~ SOLN
1800.0000 mg | Freq: Once | SUBCUTANEOUS | Status: AC
  Administered 2022-11-07: 1800 mg via SUBCUTANEOUS
  Filled 2022-11-07: qty 15

## 2022-11-07 NOTE — Progress Notes (Signed)
Patient presents today for Daratumumab injection per providers order.  Vital signs and labs within parameters for treatment.  Patient has no new complaints at this time.  Patient took premedications at home prior to visit.  Stable during administration without incident; injection site WNL; see MAR for injection details.  Patient tolerated procedure well and without incident.  No questions or complaints noted at this time.

## 2022-11-07 NOTE — Patient Instructions (Signed)
[facility]  Discharge Instructions: Thank you for choosing [facility] to provide your oncology and hematology care.  If you have a lab appointment with the Cancer Center - please note that after April 8th, 2024, all labs will be drawn in the cancer center.  You do not have to check in or register with the main entrance as you have in the past but will complete your check-in in the cancer center.  Wear comfortable clothing and clothing appropriate for easy access to any Portacath or PICC line.   We strive to give you quality time with your provider. You may need to reschedule your appointment if you arrive late (15 or more minutes).  Arriving late affects you and other patients whose appointments are after yours.  Also, if you miss three or more appointments without notifying the office, you may be dismissed from the clinic at the provider's discretion.      For prescription refill requests, have your pharmacy contact our office and allow 72 hours for refills to be completed.    Today you received the following chemotherapy and/or immunotherapy agents Daratumumab      To help prevent nausea and vomiting after your treatment, we encourage you to take your nausea medication as directed.  BELOW ARE SYMPTOMS THAT SHOULD BE REPORTED IMMEDIATELY: *FEVER GREATER THAN 100.4 F (38 C) OR HIGHER *CHILLS OR SWEATING *NAUSEA AND VOMITING THAT IS NOT CONTROLLED WITH YOUR NAUSEA MEDICATION *UNUSUAL SHORTNESS OF BREATH *UNUSUAL BRUISING OR BLEEDING *URINARY PROBLEMS (pain or burning when urinating, or frequent urination) *BOWEL PROBLEMS (unusual diarrhea, constipation, pain near the anus) TENDERNESS IN MOUTH AND THROAT WITH OR WITHOUT PRESENCE OF ULCERS (sore throat, sores in mouth, or a toothache) UNUSUAL RASH, SWELLING OR PAIN  UNUSUAL VAGINAL DISCHARGE OR ITCHING   Items with * indicate a potential emergency and should be followed up as soon as possible or go to the  Emergency Department if any problems should occur.  Please show the CHEMOTHERAPY ALERT CARD or IMMUNOTHERAPY ALERT CARD at check-in to the Emergency Department and triage nurse.  Should you have questions after your visit or need to cancel or reschedule your appointment, please contact [facility] AT  [phone]  and follow the prompts.  Office hours are 8:00 a.m. to 4:30 p.m. Monday - Friday. Please note that voicemails left after 4:00 p.m. may not be returned until the following business day.  We are closed weekends and major holidays. You have access to a nurse at all times for urgent questions. Please call the main number to the clinic [phone] and follow the prompts.  For any non-urgent questions, you may also contact your provider using MyChart. We now offer e-Visits for anyone 18 and older to request care online for non-urgent symptoms. For details visit [email or web address].   Also download the MyChart app! Go to the app store, search "MyChart", open the app, select , and log in with your MyChart username and password.   

## 2022-11-08 ENCOUNTER — Other Ambulatory Visit (HOSPITAL_COMMUNITY): Payer: Self-pay | Admitting: Family

## 2022-11-08 ENCOUNTER — Ambulatory Visit (HOSPITAL_COMMUNITY)
Admission: RE | Admit: 2022-11-08 | Discharge: 2022-11-08 | Disposition: A | Discharge status: Home / Self Care | Payer: Medicare Other | Source: Ambulatory Visit | Attending: Family | Admitting: Family

## 2022-11-08 DIAGNOSIS — M79672 Pain in left foot: Secondary | ICD-10-CM | POA: Diagnosis not present

## 2022-11-08 DIAGNOSIS — S92302D Fracture of unspecified metatarsal bone(s), left foot, subsequent encounter for fracture with routine healing: Secondary | ICD-10-CM | POA: Insufficient documentation

## 2022-11-08 DIAGNOSIS — M79671 Pain in right foot: Secondary | ICD-10-CM | POA: Diagnosis not present

## 2022-11-08 DIAGNOSIS — M19072 Primary osteoarthritis, left ankle and foot: Secondary | ICD-10-CM | POA: Diagnosis not present

## 2022-11-08 DIAGNOSIS — S92342A Displaced fracture of fourth metatarsal bone, left foot, initial encounter for closed fracture: Secondary | ICD-10-CM | POA: Diagnosis not present

## 2022-11-18 ENCOUNTER — Other Ambulatory Visit: Payer: Self-pay

## 2022-11-19 ENCOUNTER — Other Ambulatory Visit: Payer: Self-pay | Admitting: *Deleted

## 2022-11-19 ENCOUNTER — Encounter: Payer: Self-pay | Admitting: *Deleted

## 2022-11-19 DIAGNOSIS — M79672 Pain in left foot: Secondary | ICD-10-CM | POA: Diagnosis not present

## 2022-11-19 DIAGNOSIS — S92302D Fracture of unspecified metatarsal bone(s), left foot, subsequent encounter for fracture with routine healing: Secondary | ICD-10-CM | POA: Diagnosis not present

## 2022-11-19 DIAGNOSIS — S92902G Unspecified fracture of left foot, subsequent encounter for fracture with delayed healing: Secondary | ICD-10-CM | POA: Diagnosis not present

## 2022-11-19 MED ORDER — HYDROXYZINE HCL 25 MG PO TABS: 25.0000 mg | ORAL_TABLET | Freq: Four times a day (QID) | ORAL | 0 refills | Status: AC | PRN

## 2022-11-19 NOTE — Progress Notes (Signed)
Patient walked into clinic with complaints of continued rash to legs, arms and back.  Rash is minimal, however states severe itching.  Patient was assessed by Dr. Elbert Ewings and atarax was sent into pharmacy.  Patient was advised to continue Revlimid as prescribed and follow up with Dr. Ellin Saba as planned on 8/4.  Verbalized understanding.

## 2022-11-22 ENCOUNTER — Other Ambulatory Visit: Payer: Self-pay | Admitting: Hematology

## 2022-11-28 ENCOUNTER — Inpatient Hospital Stay: Payer: Medicare Other

## 2022-11-28 ENCOUNTER — Inpatient Hospital Stay: Payer: Medicare Other | Admitting: Hematology

## 2022-12-04 NOTE — Progress Notes (Signed)
[facility] [address] [place] [address]    Clinic Day:  12/05/2022  Referring physician: Benita Stabile, MD  Patient Care Team: Benita Stabile, MD as PCP - General (Internal Medicine) Wyline Mood Dorothe Pea, MD as Consulting Physician (Cardiology) Doreatha Massed, MD as Medical Oncologist (Medical Oncology)   ASSESSMENT & PLAN:   Assessment: IgG kappa smoldering multiple myeloma: - Skeletal survey in February 2022 was negative. - Bone marrow biopsy on 06/06/2020 with 20% plasma cells.  Myeloma FISH panel with monosomy 13/deletion 13.  Chromosome analysis 46, XY (20). - No "crab" features. - PET scan from 06/29/2021: No evidence of myeloma or plasmacytoma. - Skeletal survey on 06/13/2021: No lytic lesions. - MRI of the cervical/thoracic/lumbar spine negative for bone lesions with some degenerative changes. - BMBX (02/01/2022): Hypercellular marrow with plasma cells representing 35% of all cells.  They display kappa light chain restriction.  Cytogenetics-46, XY (20). - Myeloma FISH panel: Monosomy 13. - PET scan (04/05/2022): No suspicious bony lesions or soft tissue masses. - Daratumumab, Revlimid and dexamethasone started on 05/01/2022, could not tolerate Revlimid 15 mg dose as he had near syncope, Revlimid 15 mg stopped on 07/05/2022, dose decreased to 10 mg 3 weeks on/1 week off   2.  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 - No major chemical exposures.  He had personal history of melanoma of the upper lip. - Father had acute myeloid leukemia.  Mother had multiple myeloma.  Son died of acute lymphocytic leukemia    Plan: 1.  Stage II IgG kappa multiple myeloma, standard risk: - He last took Revlimid 10 mg on 11/23/2022.  He had developed a rash and itching with Revlimid. - Reviewed myeloma labs from 11/07/2022: M spike is 0.1 g.  Free light chain ratio is normal at 1.22. - Labs today: Normal CBC.  LFTs are  normal. - Recommend holding Revlimid at this time.  Proceed with monthly Darzalex.  Continue dexamethasone 20 mg weekly. - RTC 8 weeks for follow-up.  Will repeat myeloma labs 1 week prior.  If there is any progression, will consider starting pomalidomide in place of Revlimid.  2.  Peripheral neuropathy: - Continue gabapentin 300 mg 3 times daily.  This is well-controlled.   3.  Prophylaxis: - Continue acyclovir 400 mg twice daily and aspirin 81 mg daily.   4.  Osteoporosis (DEXA 09/11/2022 T-score -3.5): - Recent bone density showed osteoporosis. - Recommended bisphosphonate therapy with denosumab/Zometa. - He has declined to start bisphosphonate therapy at this time.  He will think about it and let us know.   5.  Hypomagnesemia: - Continue magnesium 1 tablet daily.  Magnesium is normal.    Orders Placed This Encounter  Procedures   CMP (Cancer Center only)    Standing Status:   Future    Standing Expiration Date:   02/27/2024   Protein electrophoresis, serum    Standing Status:   Future    Standing Expiration Date:   02/27/2024   Kappa/lambda light chains    Standing Status:   Future    Standing Expiration Date:   02/27/2024   Magnesium    Standing Status:   Future    Standing Expiration Date:   02/27/2024   CBC with Differential    Standing Status:   Future    Standing Expiration Date:   02/27/2024   CMP (Cancer Center only)    Standing Status:  Future    Standing Expiration Date:   03/26/2024   Protein electrophoresis, serum    Standing Status:   Future    Standing Expiration Date:   03/26/2024   Kappa/lambda light chains    Standing Status:   Future    Standing Expiration Date:   03/26/2024   Magnesium    Standing Status:   Future    Standing Expiration Date:   03/26/2024   CBC with Differential    Standing Status:   Future    Standing Expiration Date:   03/26/2024      I,Katie Daubenspeck,acting as a scribe for Doreatha Massed, MD.,have documented all  relevant documentation on the behalf of Doreatha Massed, MD,as directed by  Doreatha Massed, MD while in the presence of Doreatha Massed, MD.   I, Doreatha Massed MD, have reviewed the above documentation for accuracy and completeness, and I agree with the above.   Doreatha Massed, MD   9/4/20244:36 PM  CHIEF COMPLAINT:   Diagnosis: IgG kappa multiple myeloma    Cancer Staging  Multiple myeloma (HCC) Staging form: Plasma Cell Myeloma and Plasma Cell Disorders, AJCC 8th Edition - Clinical stage from 04/11/2022: RISS Stage II (Beta-2-microglobulin (mg/L): 4.1, Albumin (g/dL): 2.7, ISS: Stage II, High-risk cytogenetics: Absent, LDH: Normal) - Signed by Doreatha Massed, MD on 04/11/2022    Prior Therapy: none  Current Therapy:  Daratumumab, Revlimid and dexamethasone    HISTORY OF PRESENT ILLNESS:   Oncology History  Multiple myeloma (HCC)  04/11/2022 Initial Diagnosis   Multiple myeloma (HCC)   04/11/2022 Cancer Staging   Staging form: Plasma Cell Myeloma and Plasma Cell Disorders, AJCC 8th Edition - Clinical stage from 04/11/2022: RISS Stage II (Beta-2-microglobulin (mg/L): 4.1, Albumin (g/dL): 2.7, ISS: Stage II, High-risk cytogenetics: Absent, LDH: Normal) - Signed by Doreatha Massed, MD on 04/11/2022 Histopathologic type: Multiple myeloma Beta 2 microglobulin range (mg/L): 3.5 to 5.49 Albumin range (g/dL): Less than 3.5 Cytogenetics: No abnormalities   05/01/2022 -  Chemotherapy   Patient is on Treatment Plan : MYELOMA  Daratumumab SQ + Lenalidomide + Dexamethasone (DaraRd) q28d        INTERVAL HISTORY:   [name] [name] is a 77 y.o. male presenting to clinic today for follow up of IgG kappa multiple myeloma. He was last seen by me on 09/19/22.  Today, he states that he is doing well overall. His appetite level is at 70%. His energy level is at 50 he %.  PAST MEDICAL HISTORY:   Past Medical History: Past Medical History:  Diagnosis Date   Adverse  effect of unspecified drugs, medicaments and biological substances, initial encounter    Essential (primary) hypertension    Hyperlipidemia, unspecified    Impotence of organic origin    Malignant melanoma of skin, unspecified (HCC)    Malignant neoplasm of prostate (HCC)    Multiple myeloma (HCC)    Neuropathy    Non-pressure chronic ulcer of other part of unspecified foot with unspecified severity (HCC)    Reflux esophagitis    Restless legs syndrome    Tinea unguium    Type 2 diabetes mellitus with diabetic neuropathy, unspecified (HCC)    Type 2 diabetes mellitus with other specified complication (HCC)    Unspecified atrial fibrillation (HCC)     Surgical History: Past Surgical History:  Procedure Laterality Date   CATARACT EXTRACTION     HERNIA REPAIR     melanoma removal     PROSTATE SURGERY      Social History: Social  History   Socioeconomic History   Marital status: Married    Spouse name: Not on file   Number of children: Not on file   Years of education: Not on file   Highest education level: Not on file  Occupational History   Not on file  Tobacco Use   Smoking status: Never   Smokeless tobacco: Never  Vaping Use   Vaping status: Never Used  Substance and Sexual Activity   Alcohol use: No    Alcohol/week: 0.0 standard drinks of alcohol   Drug use: No   Sexual activity: Not Currently  Other Topics Concern   Not on file  Social History Narrative   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   Social Determinants of Health   Financial Resource Strain: Low Risk  (05/13/2020)   Overall Financial Resource Strain (CARDIA)    Difficulty of Paying Living Expenses: Not hard at all  Food Insecurity: No Food Insecurity (05/13/2020)   Hunger Vital Sign    Worried About Running Out of Food in the Last Year: Never true    Ran Out of Food in the Last Year: Never true  Transportation Needs: No Transportation Needs (05/13/2020)    PRAPARE - Administrator, Civil Service (Medical): No    Lack of Transportation (Non-Medical): No  Physical Activity: Insufficiently Active (05/13/2020)   Exercise Vital Sign    Days of Exercise per Week: 3 days    Minutes of Exercise per Session: 20 min  Stress: No Stress Concern Present (05/13/2020)   [employer] of Occupational Health - Occupational Stress Questionnaire    Feeling of Stress : Not at all  Social Connections: Moderately Integrated (05/13/2020)   Social Connection and Isolation Panel [NHANES]    Frequency of Communication with Friends and Family: Twice a week    Frequency of Social Gatherings with Friends and Family: Twice a week    Attends Religious Services: 1 to 4 times per year    Active Member of [employer] or Organizations: No    Attends Banker Meetings: Never    Marital Status: Married  Catering manager Violence: Not At Risk (05/13/2020)   Humiliation, Afraid, Rape, and Kick questionnaire    Fear of Current or Ex-Partner: No    Emotionally Abused: No    Physically Abused: No    Sexually Abused: No    Family History: Family History  Problem Relation Age of Onset   Multiple myeloma Mother    Acute myelogenous leukemia Father    Leukemia Son        acute lymphocytic leukemia    Current Medications:  Current Outpatient Medications:    ACCU-CHEK AVIVA PLUS test strip, USE 1 STRIP TO CHECK GLUCOSE TWICE DAILY, Disp: , Rfl:    Accu-Chek FastClix Lancets MISC, Apply topically 2 (two) times daily., Disp: , Rfl:    acyclovir (ZOVIRAX) 400 MG tablet, Take 1 tablet by mouth twice daily, Disp: 60 tablet, Rfl: 0   aspirin EC 81 MG tablet, Take 1 tablet (81 mg total) by mouth daily. Swallow whole., Disp: 30 tablet, Rfl: 12   atenolol-chlorthalidone (TENORETIC) 50-25 MG per tablet, Take 1 tablet by mouth daily. Takes 1/2 daily per Dr Juanetta Gosling, Disp: , Rfl:    cyanocobalamin (VITAMIN B12) 1000 MCG/ML injection, Inject 1,000 mcg into the muscle  every 30 (thirty) days., Disp: , Rfl:    Daratumumab-Hyaluronidase-fihj (DARZALEX FASPRO [place]), Inject into the skin., Disp: , Rfl:  dexamethasone (DECADRON) 4 MG tablet, Take 5 tablets (20 mg total) by mouth once a week., Disp: 30 tablet, Rfl: 6   diclofenac sodium (VOLTAREN) 1 % GEL, Apply 2 g topically daily as needed., Disp: , Rfl:    gabapentin (NEURONTIN) 300 MG capsule, Take 300 mg by mouth 3 (three) times daily., Disp: , Rfl:    hydrOXYzine (ATARAX) 25 MG tablet, Take 1 tablet (25 mg total) by mouth every 6 (six) hours as needed., Disp: 60 tablet, Rfl: 0   lenalidomide (REVLIMID) 10 MG capsule, Take 1 capsule (10 mg total) by mouth daily. 21 days on, 7 days off, Disp: 21 capsule, Rfl: 0   loperamide (IMODIUM A-D) 2 MG tablet, Take 2 mg by mouth 4 (four) times daily as needed for diarrhea or loose stools., Disp: , Rfl:    losartan (COZAAR) 50 MG tablet, Take 50 mg by mouth daily., Disp: , Rfl:    magnesium oxide (MAG-OX) 400 (240 Mg) MG tablet, Take 1 tablet (400 mg total) by mouth 2 (two) times daily., Disp: 120 tablet, Rfl: 3   metFORMIN (GLUCOPHAGE) 500 MG tablet, Take 500 mg by mouth 2 (two) times daily with a meal., Disp: , Rfl:    pantoprazole (PROTONIX) 40 MG tablet, Take 40 mg by mouth daily., Disp: , Rfl:    prochlorperazine (COMPAZINE) 10 MG tablet, Take 1 tablet (10 mg total) by mouth every 6 (six) hours as needed for nausea or vomiting., Disp: 30 tablet, Rfl: 3   silver sulfADIAZINE (SILVADENE) 1 % cream, Apply topically., Disp: , Rfl:    Allergies: Allergies  Allergen Reactions   Penicillins Rash    Also swelling reaction    Gadavist [Gadobutrol] Nausea Only    Patient became nauseated immediately after contrast injection.  Was better after a few minutes.     REVIEW OF SYSTEMS:   Review of Systems  Constitutional:  Negative for chills, fatigue and fever.  HENT:   Negative for lump/mass, mouth sores, nosebleeds, sore throat and trouble swallowing.   Eyes:  Negative for  eye problems.  Respiratory:  Negative for cough and shortness of breath.   Cardiovascular:  Negative for chest pain, leg swelling and palpitations.  Gastrointestinal:  Positive for diarrhea. Negative for abdominal pain, constipation, nausea and vomiting.  Genitourinary:  Negative for bladder incontinence, difficulty urinating, dysuria, frequency, hematuria and nocturia.   Musculoskeletal:  Negative for arthralgias, back pain, flank pain, myalgias and neck pain.  Skin:  Positive for itching and rash.  Neurological:  Positive for numbness. Negative for dizziness and headaches.  Hematological:  Does not bruise/bleed easily.  Psychiatric/Behavioral:  Positive for sleep disturbance. Negative for depression and suicidal ideas. The patient is not nervous/anxious.   All other systems reviewed and are negative.    VITALS:   Blood pressure (!) 140/100, pulse 87, temperature (!) 97.4 F (36.3 C), temperature source Oral, resp. rate 18, weight 179 lb (81.2 kg), SpO2 99%.  Wt Readings from Last 3 Encounters:  12/05/22 179 lb (81.2 kg)  11/07/22 176 lb 12.8 oz (80.2 kg)  10/03/22 178 lb 1.6 oz (80.8 kg)    Body mass index is 27.22 kg/m.  Performance status (ECOG): 1 - Symptomatic but completely ambulatory  PHYSICAL EXAM:   Physical Exam Vitals and nursing note reviewed. Exam conducted with a chaperone present.  Constitutional:      Appearance: Normal appearance.  Cardiovascular:     Rate and Rhythm: Normal rate and regular rhythm.     Pulses: Normal  pulses.     Heart sounds: Normal heart sounds.  Pulmonary:     Effort: Pulmonary effort is normal.     Breath sounds: Normal breath sounds.  Abdominal:     Palpations: Abdomen is soft. There is no hepatomegaly, splenomegaly or mass.     Tenderness: There is no abdominal tenderness.  Musculoskeletal:     Right lower leg: No edema.     Left lower leg: No edema.  Lymphadenopathy:     Cervical: No cervical adenopathy.     Right cervical: No  superficial, deep or posterior cervical adenopathy.    Left cervical: No superficial, deep or posterior cervical adenopathy.     Upper Body:     Right upper body: No supraclavicular or axillary adenopathy.     Left upper body: No supraclavicular or axillary adenopathy.  Neurological:     General: No focal deficit present.     Mental Status: He is alert and oriented to person, place, and time.  Psychiatric:        Mood and Affect: Mood normal.        Behavior: Behavior normal.     LABS:      Latest Ref Rng & Units 12/05/2022    9:56 AM 11/07/2022    8:05 AM 10/03/2022   10:20 AM  CBC  WBC 4.0 - 10.5 K/uL 8.3  5.7  7.0   Hemoglobin 13.0 - 17.0 g/dL 16.1  09.6  04.5   Hematocrit 39.0 - 52.0 % 41.1  38.1  40.1   Platelets 150 - 400 K/uL 225  284  219       Latest Ref Rng & Units 12/05/2022   10:05 AM 11/07/2022    8:05 AM 10/03/2022   10:20 AM  CMP  Glucose 70 - 99 mg/dL 409  811  914   BUN 8 - 23 mg/dL 17  17  18    Creatinine 0.61 - 1.24 mg/dL 7.82  9.56  2.13   Sodium 135 - 145 mmol/L 138  138  136   Potassium 3.5 - 5.1 mmol/L 3.5  3.3  3.5   Chloride 98 - 111 mmol/L 100  101  98   CO2 22 - 32 mmol/L 27  29  27    Calcium 8.9 - 10.3 mg/dL 9.0  8.9  8.7   Total Protein 6.5 - 8.1 g/dL 6.2  6.4  6.5   Total Bilirubin 0.3 - 1.2 mg/dL 0.9  0.8  1.2   Alkaline Phos 38 - 126 U/L 98  101  94   AST 15 - 41 U/L 12  14  14    ALT 0 - 44 U/L 12  16  12       No results found for: "CEA1", "CEA" / No results found for: "CEA1", "CEA" No results found for: "PSA1" No results found for: "CAN199" No results found for: "CAN125"  Lab Results  Component Value Date   TOTALPROTELP 5.8 (L) 11/07/2022   ALBUMINELP 3.5 11/07/2022   A1GS 0.3 11/07/2022   A2GS 0.8 11/07/2022   BETS 1.0 11/07/2022   GAMS 0.3 (L) 11/07/2022   MSPIKE 0.1 (H) 11/07/2022   SPEI Comment 11/07/2022   No results found for: "TIBC", "FERRITIN", "IRONPCTSAT" Lab Results  Component Value Date   LDH 89 (L) 04/05/2022   LDH  93 (L) 01/01/2022   LDH 107 10/04/2021     STUDIES:   CT FOOT LEFT WO CONTRAST  Result Date: 11/08/2022 CLINICAL DATA:  Left foot pain.  No known injury EXAM: CT OF THE LEFT FOOT WITHOUT CONTRAST TECHNIQUE: Multidetector CT imaging of the left foot was performed according to the standard protocol. Multiplanar CT image reconstructions were also generated. RADIATION DOSE REDUCTION: This exam was performed according to the departmental dose-optimization program which includes automated exposure control, adjustment of the mA and/or kV according to patient size and/or use of iterative reconstruction technique. COMPARISON:  X-ray 11/28/2010 FINDINGS: Bones/Joint/Cartilage Subacute-appearing fracture of the fourth metatarsal head and neck with mild impaction at the level of the metatarsal neck (series 6, image 126). There is some early callus formation indicative of healing. Appearance of the third metatarsal neck suggests prior healed fracture (series 6, image 111). No additional fractures are identified. No malalignment. Mild osteoarthritis is most pronounced at the first MTP joint. No lytic or sclerotic bone lesion. Ligaments Suboptimally assessed by CT. Muscles and Tendons Fatty atrophy of the foot musculature suggesting chronic denervation. No acute tendinous abnormality by CT. Soft tissues No soft tissue swelling or fluid collection. IMPRESSION: 1. Subacute-appearing fracture of the fourth metatarsal head and neck. 2. Appearance of the third metatarsal neck suggests prior healed fracture. 3. Mild osteoarthritis is most pronounced at the first MTP joint. Electronically Signed   By: Duanne Guess D.O.   On: 11/08/2022 14:37

## 2022-12-05 ENCOUNTER — Inpatient Hospital Stay: Payer: Medicare Other

## 2022-12-05 ENCOUNTER — Inpatient Hospital Stay: Payer: Medicare Other | Attending: Hematology

## 2022-12-05 ENCOUNTER — Inpatient Hospital Stay: Payer: Medicare Other | Admitting: Hematology

## 2022-12-05 VITALS — BP 140/100 | HR 87 | Temp 97.4°F | Resp 18 | Wt 179.0 lb

## 2022-12-05 DIAGNOSIS — Z5112 Encounter for antineoplastic immunotherapy: Secondary | ICD-10-CM | POA: Diagnosis not present

## 2022-12-05 DIAGNOSIS — C9000 Multiple myeloma not having achieved remission: Secondary | ICD-10-CM

## 2022-12-05 LAB — COMPREHENSIVE METABOLIC PANEL
ALT: 12 U/L (ref 0–44)
AST: 12 U/L — ABNORMAL LOW (ref 15–41)
Albumin: 3.7 g/dL (ref 3.5–5.0)
Alkaline Phosphatase: 98 U/L (ref 38–126)
Anion gap: 11 (ref 5–15)
BUN: 17 mg/dL (ref 8–23)
CO2: 27 mmol/L (ref 22–32)
Calcium: 9 mg/dL (ref 8.9–10.3)
Chloride: 100 mmol/L (ref 98–111)
Creatinine, Ser: 1.11 mg/dL (ref 0.61–1.24)
GFR, Estimated: >60 mL/min (ref >60)
Glucose, Bld: 183 mg/dL — ABNORMAL HIGH (ref 70–99)
Potassium: 3.5 mmol/L (ref 3.5–5.1)
Sodium: 138 mmol/L (ref 135–145)
Total Bilirubin: 0.9 mg/dL (ref 0.3–1.2)
Total Protein: 6.2 g/dL — ABNORMAL LOW (ref 6.5–8.1)

## 2022-12-05 LAB — CBC WITH DIFFERENTIAL/PLATELET
Abs Immature Granulocytes: 0.04 10*3/uL (ref 0.00–0.07)
Basophils Absolute: 0.1 10*3/uL (ref 0.0–0.1)
Basophils Relative: 1 %
Eosinophils Absolute: 0.9 10*3/uL — ABNORMAL HIGH (ref 0.0–0.5)
Eosinophils Relative: 11 %
HCT: 41.1 % (ref 39.0–52.0)
Hemoglobin: 14 g/dL (ref 13.0–17.0)
Immature Granulocytes: 1 %
Lymphocytes Relative: 25 %
Lymphs Abs: 2 10*3/uL (ref 0.7–4.0)
MCH: 31.6 pg (ref 26.0–34.0)
MCHC: 34.1 g/dL (ref 30.0–36.0)
MCV: 92.8 fL (ref 80.0–100.0)
Monocytes Absolute: 0.7 10*3/uL (ref 0.1–1.0)
Monocytes Relative: 9 %
Neutro Abs: 4.5 10*3/uL (ref 1.7–7.7)
Neutrophils Relative %: 53 %
Platelets: 225 10*3/uL (ref 150–400)
RBC: 4.43 MIL/uL (ref 4.22–5.81)
RDW: 14.9 % (ref 11.5–15.5)
WBC: 8.3 10*3/uL (ref 4.0–10.5)
nRBC: 0 % (ref 0.0–0.2)

## 2022-12-05 LAB — MAGNESIUM: Magnesium: 1.7 mg/dL (ref 1.7–2.4)

## 2022-12-05 MED ORDER — DARATUMUMAB-HYALURONIDASE-FIHJ 1800-30000 MG-UT/15ML ~~LOC~~ SOLN
1800.0000 mg | Freq: Once | SUBCUTANEOUS | Status: AC
  Administered 2022-12-05: 1800 mg via SUBCUTANEOUS
  Filled 2022-12-05: qty 15

## 2022-12-05 NOTE — Patient Instructions (Signed)
[facility] Discharge Instructions   You were seen and examined today by Dr. Ellin Saba.  He reviewed the results of your lab work which are normal/stable.   We will proceed with your treatment today.   You may hold your Revlimid. Dr. Kirtland Bouchard will let you know if you need to restart this.   Continue the steroid pills (dexamethasone) weekly.   Return as scheduled.    Thank you for choosing [facility] at [facility] to provide your oncology and hematology care.  To afford each patient quality time with our provider, please arrive at least 15 minutes before your scheduled appointment time.   If you have a lab appointment with the Cancer Center please come in thru the Main Entrance and check in at the main information desk.  You need to re-schedule your appointment should you arrive 10 or more minutes late.  We strive to give you quality time with our providers, and arriving late affects you and other patients whose appointments are after yours.  Also, if you no show three or more times for appointments you may be dismissed from the clinic at the providers discretion.     Again, thank you for choosing [facility].  Our hope is that these requests will decrease the amount of time that you wait before being seen by our physicians.       _____________________________________________________________  Should you have questions after your visit to [facility], please contact our office at [phone] and follow the prompts.  Our office hours are 8:00 a.m. and 4:30 p.m. Monday - Friday.  Please note that voicemails left after 4:00 p.m. may not be returned until the following business day.  We are closed weekends and major holidays.  You do have access to a nurse 24-7, just call the main number to the clinic [phone] and do not press any options, hold on the line and a nurse will answer the phone.    For  prescription refill requests, have your pharmacy contact our office and allow 72 hours.    Due to Covid, you will need to wear a mask upon entering the hospital. If you do not have a mask, a mask will be given to you at the Main Entrance upon arrival. For doctor visits, patients may have 1 support person age 40 or older with them. For treatment visits, patients can not have anyone with them due to social distancing guidelines and our immunocompromised population.

## 2022-12-05 NOTE — Progress Notes (Signed)
Patient presents today for Daratumumab injection. Patient is in satisfactory condition with no new complaints voiced.  Vital signs are stable.  Labs reviewed by Dr. Ellin Saba during the office visit and all labs are within treatment parameters.  Tylenol and Zyrtec taken at 1030 at home prior to visit.  We will proceed with treatment per MD orders.   Patient tolerated injection well with no complaints voiced.  Patient left ambulatory with wife in stable condition.  Vital signs stable at discharge.  Follow up as scheduled.

## 2022-12-05 NOTE — Progress Notes (Signed)
Patient has been examined by Dr. Katragadda. Vital signs and labs have been reviewed by MD - ANC, Creatinine, LFTs, hemoglobin, and platelets are within treatment parameters per M.D. - pt may proceed with treatment.  Primary RN and pharmacy notified.  

## 2022-12-05 NOTE — Patient Instructions (Signed)
[facility] [place]  Discharge Instructions: Thank you for choosing [facility] to provide your oncology and hematology care.  If you have a lab appointment with the Cancer Center - please note that after April 8th, 2024, all labs will be drawn in the cancer center.  You do not have to check in or register with the main entrance as you have in the past but will complete your check-in in the cancer center.  Wear comfortable clothing and clothing appropriate for easy access to any Portacath or PICC line.   We strive to give you quality time with your provider. You may need to reschedule your appointment if you arrive late (15 or more minutes).  Arriving late affects you and other patients whose appointments are after yours.  Also, if you miss three or more appointments without notifying the office, you may be dismissed from the clinic at the provider's discretion.      For prescription refill requests, have your pharmacy contact our office and allow 72 hours for refills to be completed.    Today you received the following chemotherapy and/or immunotherapy agents Daratumumab.  Daratumumab; Hyaluronidase Injection What is this medication? DARATUMUMAB; HYALURONIDASE (dar a toom ue mab; hye al ur ON i dase) treats multiple myeloma, a type of bone marrow cancer. Daratumumab works by blocking a protein that causes cancer cells to grow and multiply. This helps to slow or stop the spread of cancer cells. Hyaluronidase works by increasing the absorption of other medications in the body to help them work better. This medication may also be used treat amyloidosis, a condition that causes the buildup of a protein (amyloid) in your body. It works by reducing the buildup of this protein, which decreases symptoms. It is a combination medication that contains a monoclonal antibody. This medicine may be used for other purposes; ask your health care provider or pharmacist if you have  questions. COMMON BRAND NAME(S): DARZALEX FASPRO What should I tell my care team before I take this medication? They need to know if you have any of these conditions: Heart disease Infection, such as chickenpox, cold sores, herpes, hepatitis B Lung or breathing disease An unusual or allergic reaction to daratumumab, hyaluronidase, other medications, foods, dyes, or preservatives Pregnant or trying to get pregnant Breast-feeding How should I use this medication? This medication is injected under the skin. It is given by your care team in a hospital or clinic setting. Talk to your care team about the use of this medication in children. Special care may be needed. Overdosage: If you think you have taken too much of this medicine contact a poison control center or emergency room at once. NOTE: This medicine is only for you. Do not share this medicine with others. What if I miss a dose? Keep appointments for follow-up doses. It is important not to miss your dose. Call your care team if you are unable to keep an appointment. What may interact with this medication? Interactions have not been studied. This list may not describe all possible interactions. Give your health care provider a list of all the medicines, herbs, non-prescription drugs, or dietary supplements you use. Also tell them if you smoke, drink alcohol, or use illegal drugs. Some items may interact with your medicine. What should I watch for while using this medication? Your condition will be monitored carefully while you are receiving this medication. This medication can cause serious allergic reactions. To reduce your risk, your care team may   give you other medication to take before receiving this one. Be sure to follow the directions from your care team. This medication can affect the results of blood tests to match your blood type. These changes can last for up to 6 months after the final dose. Your care team will do blood tests to  match your blood type before you start treatment. Tell all of your care team that you are being treated with this medication before receiving a blood transfusion. This medication can affect the results of some tests used to determine treatment response; extra tests may be needed to evaluate response. Talk to your care team if you wish to become pregnant or think you are pregnant. This medication can cause serious birth defects if taken during pregnancy and for 3 months after the last dose. A reliable form of contraception is recommended while taking this medication and for 3 months after the last dose. Talk to your care team about effective forms of contraception. Do not breast-feed while taking this medication. What side effects may I notice from receiving this medication? Side effects that you should report to your care team as soon as possible: Allergic reactions--skin rash, itching, hives, swelling of the face, lips, tongue, or throat Heart rhythm changes--fast or irregular heartbeat, dizziness, feeling faint or lightheaded, chest pain, trouble breathing Infection--fever, chills, cough, sore throat, wounds that don't heal, pain or trouble when passing urine, general feeling of discomfort or being unwell Infusion reactions--chest pain, shortness of breath or trouble breathing, feeling faint or lightheaded Sudden eye pain or change in vision such as blurry vision, seeing halos around lights, vision loss Unusual bruising or bleeding Side effects that usually do not require medical attention (report to your care team if they continue or are bothersome): Constipation Diarrhea Fatigue Nausea Pain, tingling, or numbness in the hands or feet Swelling of the ankles, hands, or feet This list may not describe all possible side effects. Call your doctor for medical advice about side effects. You may report side effects to FDA at 1-800-FDA-1088. Where should I keep my medication? This medication is given  in a hospital or clinic. It will not be stored at home. NOTE: This sheet is a summary. It may not cover all possible information. If you have questions about this medicine, talk to your doctor, pharmacist, or health care provider.  2024 Elsevier/Gold Standard (2021-07-25 00:00:00)        To help prevent nausea and vomiting after your treatment, we encourage you to take your nausea medication as directed.  BELOW ARE SYMPTOMS THAT SHOULD BE REPORTED IMMEDIATELY: *FEVER GREATER THAN 100.4 F (38 C) OR HIGHER *CHILLS OR SWEATING *NAUSEA AND VOMITING THAT IS NOT CONTROLLED WITH YOUR NAUSEA MEDICATION *UNUSUAL SHORTNESS OF BREATH *UNUSUAL BRUISING OR BLEEDING *URINARY PROBLEMS (pain or burning when urinating, or frequent urination) *BOWEL PROBLEMS (unusual diarrhea, constipation, pain near the anus) TENDERNESS IN MOUTH AND THROAT WITH OR WITHOUT PRESENCE OF ULCERS (sore throat, sores in mouth, or a toothache) UNUSUAL RASH, SWELLING OR PAIN  UNUSUAL VAGINAL DISCHARGE OR ITCHING   Items with * indicate a potential emergency and should be followed up as soon as possible or go to the Emergency Department if any problems should occur.  Please show the CHEMOTHERAPY ALERT CARD or IMMUNOTHERAPY ALERT CARD at check-in to the Emergency Department and triage nurse.  Should you have questions after your visit or need to cancel or reschedule your appointment, please contact [facility] [place] [phone]  and follow   the prompts.  Office hours are 8:00 a.m. to 4:30 p.m. Monday - Friday. Please note that voicemails left after 4:00 p.m. may not be returned until the following business day.  We are closed weekends and major holidays. You have access to a nurse at all times for urgent questions. Please call the main number to the clinic [phone] and follow the prompts.  For any non-urgent questions, you may also contact your provider using MyChart. We now offer e-Visits for anyone 18 and older  to request care online for non-urgent symptoms. For details visit [email or web address].   Also download the MyChart app! Go to the app store, search "MyChart", open the app, select , and log in with your MyChart username and password.   

## 2022-12-06 ENCOUNTER — Other Ambulatory Visit: Payer: Self-pay

## 2022-12-06 LAB — KAPPA/LAMBDA LIGHT CHAINS
Kappa free light chain: 14.1 mg/L (ref 3.3–19.4)
Kappa, lambda light chain ratio: 1.52 (ref 0.26–1.65)
Lambda free light chains: 9.3 mg/L (ref 5.7–26.3)

## 2022-12-07 LAB — PROTEIN ELECTROPHORESIS, SERUM
A/G Ratio: 1.4 (ref 0.7–1.7)
Albumin ELP: 3.4 g/dL (ref 2.9–4.4)
Alpha-1-Globulin: 0.2 g/dL (ref 0.0–0.4)
Alpha-2-Globulin: 0.7 g/dL (ref 0.4–1.0)
Beta Globulin: 0.9 g/dL (ref 0.7–1.3)
Gamma Globulin: 0.6 g/dL (ref 0.4–1.8)
Globulin, Total: 2.4 g/dL (ref 2.2–3.9)
M-Spike, %: 0.2 g/dL — ABNORMAL HIGH
Total Protein ELP: 5.8 g/dL — ABNORMAL LOW (ref 6.0–8.5)

## 2022-12-17 DIAGNOSIS — S92302D Fracture of unspecified metatarsal bone(s), left foot, subsequent encounter for fracture with routine healing: Secondary | ICD-10-CM | POA: Diagnosis not present

## 2022-12-23 ENCOUNTER — Other Ambulatory Visit: Payer: Self-pay | Admitting: Hematology

## 2022-12-24 ENCOUNTER — Encounter: Payer: Self-pay | Admitting: Hematology

## 2023-01-02 ENCOUNTER — Inpatient Hospital Stay: Payer: Medicare Other

## 2023-01-02 ENCOUNTER — Inpatient Hospital Stay: Payer: Medicare Other | Attending: Hematology

## 2023-01-02 VITALS — BP 119/72 | HR 72 | Temp 96.8°F | Resp 20 | Wt 179.9 lb

## 2023-01-02 DIAGNOSIS — C9000 Multiple myeloma not having achieved remission: Secondary | ICD-10-CM | POA: Insufficient documentation

## 2023-01-02 DIAGNOSIS — Z5112 Encounter for antineoplastic immunotherapy: Secondary | ICD-10-CM | POA: Insufficient documentation

## 2023-01-02 LAB — CBC WITH DIFFERENTIAL/PLATELET
Abs Immature Granulocytes: 0.03 10*3/uL (ref 0.00–0.07)
Basophils Absolute: 0 10*3/uL (ref 0.0–0.1)
Basophils Relative: 0 %
Eosinophils Absolute: 0.6 10*3/uL — ABNORMAL HIGH (ref 0.0–0.5)
Eosinophils Relative: 5 %
HCT: 41.4 % (ref 39.0–52.0)
Hemoglobin: 14.2 g/dL (ref 13.0–17.0)
Immature Granulocytes: 0 %
Lymphocytes Relative: 22 %
Lymphs Abs: 2.4 10*3/uL (ref 0.7–4.0)
MCH: 31.8 pg (ref 26.0–34.0)
MCHC: 34.3 g/dL (ref 30.0–36.0)
MCV: 92.8 fL (ref 80.0–100.0)
Monocytes Absolute: 0.9 10*3/uL (ref 0.1–1.0)
Monocytes Relative: 8 %
Neutro Abs: 7.1 10*3/uL (ref 1.7–7.7)
Neutrophils Relative %: 65 %
Platelets: 248 10*3/uL (ref 150–400)
RBC: 4.46 MIL/uL (ref 4.22–5.81)
RDW: 14.5 % (ref 11.5–15.5)
WBC: 11.1 10*3/uL — ABNORMAL HIGH (ref 4.0–10.5)
nRBC: 0 % (ref 0.0–0.2)

## 2023-01-02 LAB — MAGNESIUM: Magnesium: 1.9 mg/dL (ref 1.7–2.4)

## 2023-01-02 LAB — CMP (CANCER CENTER ONLY)
ALT: 13 U/L (ref 0–44)
AST: 13 U/L — ABNORMAL LOW (ref 15–41)
Albumin: 4 g/dL (ref 3.5–5.0)
Alkaline Phosphatase: 106 U/L (ref 38–126)
Anion gap: 8 (ref 5–15)
BUN: 20 mg/dL (ref 8–23)
CO2: 29 mmol/L (ref 22–32)
Calcium: 9 mg/dL (ref 8.9–10.3)
Chloride: 102 mmol/L (ref 98–111)
Creatinine: 1.08 mg/dL (ref 0.61–1.24)
GFR, Estimated: >60 mL/min (ref >60)
Glucose, Bld: 163 mg/dL — ABNORMAL HIGH (ref 70–99)
Potassium: 3.9 mmol/L (ref 3.5–5.1)
Sodium: 139 mmol/L (ref 135–145)
Total Bilirubin: 0.5 mg/dL (ref 0.3–1.2)
Total Protein: 6.3 g/dL — ABNORMAL LOW (ref 6.5–8.1)

## 2023-01-02 MED ORDER — DARATUMUMAB-HYALURONIDASE-FIHJ 1800-30000 MG-UT/15ML ~~LOC~~ SOLN
1800.0000 mg | Freq: Once | SUBCUTANEOUS | Status: AC
  Administered 2023-01-02: 1800 mg via SUBCUTANEOUS
  Filled 2023-01-02: qty 15

## 2023-01-02 MED ORDER — ACETAMINOPHEN 325 MG PO TABS
650.0000 mg | ORAL_TABLET | Freq: Once | ORAL | Status: DC
  Filled 2023-01-02: qty 2

## 2023-01-02 MED ORDER — CETIRIZINE HCL 10 MG PO TABS
10.0000 mg | ORAL_TABLET | Freq: Every day | ORAL | Status: DC
  Filled 2023-01-02: qty 1

## 2023-01-02 NOTE — Patient Instructions (Signed)
[facility]  Discharge Instructions: Thank you for choosing [facility] to provide your oncology and hematology care.  If you have a lab appointment with the Cancer Center - please note that after April 8th, 2024, all labs will be drawn in the cancer center.  You do not have to check in or register with the main entrance as you have in the past but will complete your check-in in the cancer center.  Wear comfortable clothing and clothing appropriate for easy access to any Portacath or PICC line.   We strive to give you quality time with your provider. You may need to reschedule your appointment if you arrive late (15 or more minutes).  Arriving late affects you and other patients whose appointments are after yours.  Also, if you miss three or more appointments without notifying the office, you may be dismissed from the clinic at the provider's discretion.      For prescription refill requests, have your pharmacy contact our office and allow 72 hours for refills to be completed.    Today you received the following chemotherapy and/or immunotherapy agents darzalex.        To help prevent nausea and vomiting after your treatment, we encourage you to take your nausea medication as directed.  BELOW ARE SYMPTOMS THAT SHOULD BE REPORTED IMMEDIATELY: *FEVER GREATER THAN 100.4 F (38 C) OR HIGHER *CHILLS OR SWEATING *NAUSEA AND VOMITING THAT IS NOT CONTROLLED WITH YOUR NAUSEA MEDICATION *UNUSUAL SHORTNESS OF BREATH *UNUSUAL BRUISING OR BLEEDING *URINARY PROBLEMS (pain or burning when urinating, or frequent urination) *BOWEL PROBLEMS (unusual diarrhea, constipation, pain near the anus) TENDERNESS IN MOUTH AND THROAT WITH OR WITHOUT PRESENCE OF ULCERS (sore throat, sores in mouth, or a toothache) UNUSUAL RASH, SWELLING OR PAIN  UNUSUAL VAGINAL DISCHARGE OR ITCHING   Items with * indicate a potential emergency and should be followed up as soon as possible or go to  the Emergency Department if any problems should occur.  Please show the CHEMOTHERAPY ALERT CARD or IMMUNOTHERAPY ALERT CARD at check-in to the Emergency Department and triage nurse.  Should you have questions after your visit or need to cancel or reschedule your appointment, please contact [facility] [phone]  and follow the prompts.  Office hours are 8:00 a.m. to 4:30 p.m. Monday - Friday. Please note that voicemails left after 4:00 p.m. may not be returned until the following business day.  We are closed weekends and major holidays. You have access to a nurse at all times for urgent questions. Please call the main number to the clinic [phone] and follow the prompts.  For any non-urgent questions, you may also contact your provider using MyChart. We now offer e-Visits for anyone 79 and older to request care online for non-urgent symptoms. For details visit [email or web address].   Also download the MyChart app! Go to the app store, search "MyChart", open the app, select [place], and log in with your MyChart username and password.

## 2023-01-02 NOTE — Progress Notes (Signed)
Patient tolerated Daratumumab injection with no complaints voiced.  See MAR for details.  Labs reviewed. Injection site clean and dry with no bruising or swelling noted at site.  Band aid applied.  Vss with discharge and left in satisfactory condition with no s/s of distress noted.

## 2023-01-15 ENCOUNTER — Other Ambulatory Visit: Payer: Self-pay

## 2023-01-22 ENCOUNTER — Other Ambulatory Visit: Payer: Self-pay

## 2023-01-22 DIAGNOSIS — C9000 Multiple myeloma not having achieved remission: Secondary | ICD-10-CM

## 2023-01-23 ENCOUNTER — Inpatient Hospital Stay: Payer: Medicare Other

## 2023-01-23 DIAGNOSIS — C9000 Multiple myeloma not having achieved remission: Secondary | ICD-10-CM | POA: Diagnosis not present

## 2023-01-23 DIAGNOSIS — Z5112 Encounter for antineoplastic immunotherapy: Secondary | ICD-10-CM | POA: Diagnosis not present

## 2023-01-23 LAB — COMPREHENSIVE METABOLIC PANEL
ALT: 14 U/L (ref 0–44)
AST: 14 U/L — ABNORMAL LOW (ref 15–41)
Albumin: 4.1 g/dL (ref 3.5–5.0)
Alkaline Phosphatase: 91 U/L (ref 38–126)
Anion gap: 11 (ref 5–15)
BUN: 22 mg/dL (ref 8–23)
CO2: 27 mmol/L (ref 22–32)
Calcium: 9 mg/dL (ref 8.9–10.3)
Chloride: 99 mmol/L (ref 98–111)
Creatinine, Ser: 1.15 mg/dL (ref 0.61–1.24)
GFR, Estimated: >60 mL/min (ref >60)
Glucose, Bld: 104 mg/dL — ABNORMAL HIGH (ref 70–99)
Potassium: 3.5 mmol/L (ref 3.5–5.1)
Sodium: 137 mmol/L (ref 135–145)
Total Bilirubin: 0.8 mg/dL (ref 0.3–1.2)
Total Protein: 6.8 g/dL (ref 6.5–8.1)

## 2023-01-23 LAB — CBC WITH DIFFERENTIAL/PLATELET
Abs Immature Granulocytes: 0.02 10*3/uL (ref 0.00–0.07)
Basophils Absolute: 0.1 10*3/uL (ref 0.0–0.1)
Basophils Relative: 1 %
Eosinophils Absolute: 0.4 10*3/uL (ref 0.0–0.5)
Eosinophils Relative: 4 %
HCT: 43 % (ref 39.0–52.0)
Hemoglobin: 14.5 g/dL (ref 13.0–17.0)
Immature Granulocytes: 0 %
Lymphocytes Relative: 28 %
Lymphs Abs: 2.4 10*3/uL (ref 0.7–4.0)
MCH: 31.2 pg (ref 26.0–34.0)
MCHC: 33.7 g/dL (ref 30.0–36.0)
MCV: 92.5 fL (ref 80.0–100.0)
Monocytes Absolute: 0.9 10*3/uL (ref 0.1–1.0)
Monocytes Relative: 11 %
Neutro Abs: 4.8 10*3/uL (ref 1.7–7.7)
Neutrophils Relative %: 56 %
Platelets: 226 10*3/uL (ref 150–400)
RBC: 4.65 MIL/uL (ref 4.22–5.81)
RDW: 14.6 % (ref 11.5–15.5)
WBC: 8.5 10*3/uL (ref 4.0–10.5)
nRBC: 0 % (ref 0.0–0.2)

## 2023-01-23 LAB — MAGNESIUM: Magnesium: 2 mg/dL (ref 1.7–2.4)

## 2023-01-25 LAB — PROTEIN ELECTROPHORESIS, SERUM
A/G Ratio: 1.7 (ref 0.7–1.7)
Albumin ELP: 3.8 g/dL (ref 2.9–4.4)
Alpha-1-Globulin: 0.2 g/dL (ref 0.0–0.4)
Alpha-2-Globulin: 0.7 g/dL (ref 0.4–1.0)
Beta Globulin: 1 g/dL (ref 0.7–1.3)
Gamma Globulin: 0.4 g/dL (ref 0.4–1.8)
Globulin, Total: 2.3 g/dL (ref 2.2–3.9)
M-Spike, %: 0.1 g/dL — ABNORMAL HIGH
Total Protein ELP: 6.1 g/dL (ref 6.0–8.5)

## 2023-01-25 LAB — KAPPA/LAMBDA LIGHT CHAINS
Kappa free light chain: 12.8 mg/L (ref 3.3–19.4)
Kappa, lambda light chain ratio: 1.86 — ABNORMAL HIGH (ref 0.26–1.65)
Lambda free light chains: 6.9 mg/L (ref 5.7–26.3)

## 2023-01-28 ENCOUNTER — Other Ambulatory Visit: Payer: Self-pay | Admitting: Hematology

## 2023-01-30 ENCOUNTER — Inpatient Hospital Stay: Payer: Medicare Other

## 2023-01-30 ENCOUNTER — Inpatient Hospital Stay (HOSPITAL_BASED_OUTPATIENT_CLINIC_OR_DEPARTMENT_OTHER): Payer: Medicare Other | Admitting: Hematology

## 2023-01-30 VITALS — BP 122/77 | HR 89 | Temp 97.8°F | Resp 18 | Wt 181.6 lb

## 2023-01-30 DIAGNOSIS — C9000 Multiple myeloma not having achieved remission: Secondary | ICD-10-CM | POA: Diagnosis not present

## 2023-01-30 DIAGNOSIS — Z5112 Encounter for antineoplastic immunotherapy: Secondary | ICD-10-CM | POA: Diagnosis not present

## 2023-01-30 MED ORDER — ZOLEDRONIC ACID 4 MG/100ML IV SOLN
4.0000 mg | Freq: Once | INTRAVENOUS | Status: AC
Start: 2023-01-30 — End: 2023-01-30
  Administered 2023-01-30: 4 mg via INTRAVENOUS
  Filled 2023-01-30: qty 100

## 2023-01-30 MED ORDER — DARATUMUMAB-HYALURONIDASE-FIHJ 1800-30000 MG-UT/15ML ~~LOC~~ SOLN
1800.0000 mg | Freq: Once | SUBCUTANEOUS | Status: AC
  Administered 2023-01-30: 1800 mg via SUBCUTANEOUS
  Filled 2023-01-30: qty 15

## 2023-01-30 MED ORDER — DEXAMETHASONE 4 MG PO TABS
20.0000 mg | ORAL_TABLET | Freq: Once | ORAL | Status: AC
  Administered 2023-01-30: 20 mg via ORAL
  Filled 2023-01-30: qty 5

## 2023-01-30 MED ORDER — SODIUM CHLORIDE 0.9 % IV SOLN: INTRAVENOUS | Status: DC

## 2023-01-30 MED ORDER — ACETAMINOPHEN 325 MG PO TABS
650.0000 mg | ORAL_TABLET | Freq: Once | ORAL | Status: AC
Start: 2023-01-30 — End: 2023-01-30
  Administered 2023-01-30: 650 mg via ORAL
  Filled 2023-01-30: qty 2

## 2023-01-30 MED ORDER — CETIRIZINE HCL 10 MG PO TABS
10.0000 mg | ORAL_TABLET | Freq: Once | ORAL | Status: AC
  Administered 2023-01-30: 10 mg via ORAL
  Filled 2023-01-30: qty 1

## 2023-01-30 NOTE — Progress Notes (Signed)
Patient has been examined by Dr. Katragadda. Vital signs and labs have been reviewed by MD - ANC, Creatinine, LFTs, hemoglobin, and platelets are within treatment parameters per M.D. - pt may proceed with treatment.  Primary RN and pharmacy notified.  

## 2023-01-30 NOTE — Patient Instructions (Signed)
[facility]  Discharge Instructions: Thank you for choosing  Cancer Center to provide your oncology and hematology care.  If you have a lab appointment with the Cancer Center - please note that after April 8th, 2024, all labs will be drawn in the cancer center.  You do not have to check in or register with the main entrance as you have in the past but will complete your check-in in the cancer center.  Wear comfortable clothing and clothing appropriate for easy access to any Portacath or PICC line.   We strive to give you quality time with your provider. You may need to reschedule your appointment if you arrive late (15 or more minutes).  Arriving late affects you and other patients whose appointments are after yours.  Also, if you miss three or more appointments without notifying the office, you may be dismissed from the clinic at the provider's discretion.      For prescription refill requests, have your pharmacy contact our office and allow 72 hours for refills to be completed.    Today you received the following chemotherapy and/or immunotherapy agents Dara [place] and Zometa 4mg  IV    To help prevent nausea and vomiting after your treatment, we encourage you to take your nausea medication as directed.  Daratumumab Injection What is this medication? DARATUMUMAB (dar a toom ue mab) treats multiple myeloma, a type of bone marrow cancer. It works by helping your immune system slow or stop the spread of cancer cells. It is a monoclonal antibody. This medicine may be used for other purposes; ask your health care provider or pharmacist if you have questions. COMMON BRAND NAME(S): DARZALEX What should I tell my care team before I take this medication? They need to know if you have any of these conditions: Hereditary fructose intolerance Infection, such as chickenpox, herpes, hepatitis B Lung or breathing disease, such as asthma, COPD An unusual or allergic reaction to  daratumumab, sorbitol, other medications, foods, dyes, or preservatives Pregnant or trying to get pregnant Breastfeeding How should I use this medication? This medication is injected into a vein. It is given by your care team in a hospital or clinic setting. Talk to your care team about the use of this medication in children. Special care may be needed. Overdosage: If you think you have taken too much of this medicine contact a poison control center or emergency room at once. NOTE: This medicine is only for you. Do not share this medicine with others. What if I miss a dose? Keep appointments for follow-up doses. It is important not to miss your dose. Call your care team if you are unable to keep an appointment. What may interact with this medication? Interactions have not been studied. This list may not describe all possible interactions. Give your health care provider a list of all the medicines, herbs, non-prescription drugs, or dietary supplements you use. Also tell them if you smoke, drink alcohol, or use illegal drugs. Some items may interact with your medicine. What should I watch for while using this medication? Your condition will be monitored carefully while you are receiving this medication. This medication can cause serious allergic reactions. To reduce your risk, your care team may give you other medication to take before receiving this one. Be sure to follow the directions from your care team. This medication can affect the results of blood tests to match your blood type. These changes can last for up to 6 months after  the final dose. Your care team will do blood tests to match your blood type before you start treatment. Tell all of your care team that you are being treated with this medication before receiving a blood transfusion. This medication can affect the results of some tests used to determine treatment response; extra tests may be needed to evaluate response. Talk to your care  team if you wish to become pregnant or think you are pregnant. This medication can cause serious birth defects if taken during pregnancy and for 3 months after the last dose. A reliable form of contraception is recommended while taking this medication and for 3 months after the last dose. Talk to your care team about effective forms of contraception. Do not breast-feed while taking this medication. What side effects may I notice from receiving this medication? Side effects that you should report to your care team as soon as possible: Allergic reactions--skin rash, itching, hives, swelling of the face, lips, tongue, or throat Infection--fever, chills, cough, sore throat, wounds that don't heal, pain or trouble when passing urine, general feeling of discomfort or being unwell Infusion reactions--chest pain, shortness of breath or trouble breathing, feeling faint or lightheaded Unusual bruising or bleeding Side effects that usually do not require medical attention (report to your care team if they continue or are bothersome): Constipation Diarrhea Fatigue Nausea Pain, tingling, or numbness in the hands or feet Swelling of the ankles, hands, or feet This list may not describe all possible side effects. Call your doctor for medical advice about side effects. You may report side effects to FDA at 1-800-FDA-1088. Where should I keep my medication? This medication is given in a hospital or clinic. It will not be stored at home. NOTE: This sheet is a summary. It may not cover all possible information. If you have questions about this medicine, talk to your doctor, pharmacist, or health care provider.  2024 Elsevier/Gold Standard (2022-01-25 00:00:00)   BELOW ARE SYMPTOMS THAT SHOULD BE REPORTED IMMEDIATELY: *FEVER GREATER THAN 100.4 F (38 C) OR HIGHER *CHILLS OR SWEATING *NAUSEA AND VOMITING THAT IS NOT CONTROLLED WITH YOUR NAUSEA MEDICATION *UNUSUAL SHORTNESS OF BREATH *UNUSUAL BRUISING OR  BLEEDING *URINARY PROBLEMS (pain or burning when urinating, or frequent urination) *BOWEL PROBLEMS (unusual diarrhea, constipation, pain near the anus) TENDERNESS IN MOUTH AND THROAT WITH OR WITHOUT PRESENCE OF ULCERS (sore throat, sores in mouth, or a toothache) UNUSUAL RASH, SWELLING OR PAIN  UNUSUAL VAGINAL DISCHARGE OR ITCHING   Items with * indicate a potential emergency and should be followed up as soon as possible or go to the Emergency Department if any problems should occur.  Please show the CHEMOTHERAPY ALERT CARD or IMMUNOTHERAPY ALERT CARD at check-in to the Emergency Department and triage nurse.  Should you have questions after your visit or need to cancel or reschedule your appointment, please contact [facility] [phone]  and follow the prompts.  Office hours are 8:00 a.m. to 4:30 p.m. Monday - Friday. Please note that voicemails left after 4:00 p.m. may not be returned until the following business day.  We are closed weekends and major holidays. You have access to a nurse at all times for urgent questions. Please call the main number to the clinic [phone] and follow the prompts.  For any non-urgent questions, you may also contact your provider using MyChart. We now offer e-Visits for anyone 41 and older to request care online for non-urgent symptoms. For details visit [email or web address].   Also download  the MyChart app! Go to the app store, search "MyChart", open the app, select [place], and log in with your MyChart username and password.

## 2023-01-30 NOTE — Progress Notes (Signed)
[facility] [address], [place] [address]    Clinic Day:  01/30/2023  Referring physician: Benita Stabile, MD  Patient Care Team: Benita Stabile, MD as PCP - General (Internal Medicine) Wyline Mood Dorothe Pea, MD as Consulting Physician (Cardiology) Doreatha Massed, MD as Medical Oncologist (Medical Oncology)   ASSESSMENT & PLAN:   Assessment: IgG kappa smoldering multiple myeloma: - Skeletal survey in February 2022 was negative. - Bone marrow biopsy on 06/06/2020 with 20% plasma cells.  Myeloma FISH panel with monosomy 13/deletion 13.  Chromosome analysis 46, XY (20). - No "crab" features. - PET scan from 06/29/2021: No evidence of myeloma or plasmacytoma. - Skeletal survey on 06/13/2021: No lytic lesions. - MRI of the cervical/thoracic/lumbar spine negative for bone lesions with some degenerative changes. - BMBX (02/01/2022): Hypercellular marrow with plasma cells representing 35% of all cells.  They display kappa light chain restriction.  Cytogenetics-46, XY (20). - Myeloma FISH panel: Monosomy 13. - PET scan (04/05/2022): No suspicious bony lesions or soft tissue masses. - Daratumumab, Revlimid and dexamethasone started on 05/01/2022, could not tolerate Revlimid 15 mg dose as he had near syncope, Revlimid 15 mg stopped on 07/05/2022, dose decreased to 10 mg 3 weeks on/1 week off.  Revlimid 10 mg discontinued on 11/23/2022 due to recurrent rash and itching.   2.  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 - No major chemical exposures.  He had personal history of melanoma of the upper lip. - Father had acute myeloid leukemia.  Mother had multiple myeloma.  Son died of acute lymphocytic leukemia    Plan: 1.  Stage II IgG kappa multiple myeloma, standard risk: - He has been off of Revlimid due to recurrent rash and itching. - Denies any infections in the past couple of months. - Reviewed myeloma labs from  01/23/2023: M spike is 0.1 g.  FLC ratio is slightly elevated at 1.86, kappa light chains normal at 12.8.  CBC was normal.  LFTs were normal. - Recommend continuing monthly Darzalex at this time.  Recommend follow-up in 3 months with repeat myeloma labs.  If there is any evidence of slight progression, we will consider adding pomalidomide.  2.  Peripheral neuropathy: - Continue gabapentin 300 mg 3 times daily.  Well-controlled.   3.  Prophylaxis: - Continue acyclovir 4 mg twice daily and aspirin 81 mg daily.   4.  Osteoporosis (DEXA 09/11/2022 T-score -3.5): - He was evaluated by his dentist and was cleared for bisphosphonates. - I talked him about zoledronic acid given monthly.  We discussed side effects including hypocalcemia, flulike symptoms, and rare chance of osteonecrosis of the jaw.  He is agreeable. - We will proceed with his first infusion today.   5.  Hypomagnesemia: - Continue magnesium daily.  Magnesium is normal.    No orders of the defined types were placed in this encounter.     Doreatha Massed, MD   10/30/202411:18 AM  CHIEF COMPLAINT:   Diagnosis: IgG kappa multiple myeloma    Cancer Staging  Multiple myeloma (HCC) Staging form: Plasma Cell Myeloma and Plasma Cell Disorders, AJCC 8th Edition - Clinical stage from 04/11/2022: RISS Stage II (Beta-2-microglobulin (mg/L): 4.1, Albumin (g/dL): 2.7, ISS: Stage II, High-risk cytogenetics: Absent, LDH: Normal) - Signed by Doreatha Massed, MD on 04/11/2022    Prior Therapy: none  Current Therapy:  Daratumumab, Revlimid and dexamethasone    HISTORY OF PRESENT ILLNESS:  Oncology History  Multiple myeloma (HCC)  04/11/2022 Initial Diagnosis   Multiple myeloma (HCC)   04/11/2022 Cancer Staging   Staging form: Plasma Cell Myeloma and Plasma Cell Disorders, AJCC 8th Edition - Clinical stage from 04/11/2022: RISS Stage II (Beta-2-microglobulin (mg/L): 4.1, Albumin (g/dL): 2.7, ISS: Stage II, High-risk  cytogenetics: Absent, LDH: Normal) - Signed by Doreatha Massed, MD on 04/11/2022 Histopathologic type: Multiple myeloma Beta 2 microglobulin range (mg/L): 3.5 to 5.49 Albumin range (g/dL): Less than 3.5 Cytogenetics: No abnormalities   05/01/2022 -  Chemotherapy   Patient is on Treatment Plan : MYELOMA  Daratumumab SQ + Lenalidomide + Dexamethasone (DaraRd) q28d        INTERVAL HISTORY:   [name] [name] is a 77 y.o. male seen for follow-up of multiple myeloma.  He reports that he was evaluated by his dentist last week and was cleared to receive bisphosphonates.  Appetite is 100%.  Energy levels are 60%.  Pain in the foot from previous fracture.  PAST MEDICAL HISTORY:   Past Medical History: Past Medical History:  Diagnosis Date   Adverse effect of unspecified drugs, medicaments and biological substances, initial encounter    Essential (primary) hypertension    Hyperlipidemia, unspecified    Impotence of organic origin    Malignant melanoma of skin, unspecified (HCC)    Malignant neoplasm of prostate (HCC)    Multiple myeloma (HCC)    Neuropathy    Non-pressure chronic ulcer of other part of unspecified foot with unspecified severity (HCC)    Reflux esophagitis    Restless legs syndrome    Tinea unguium    Type 2 diabetes mellitus with diabetic neuropathy, unspecified (HCC)    Type 2 diabetes mellitus with other specified complication (HCC)    Unspecified atrial fibrillation (HCC)     Surgical History: Past Surgical History:  Procedure Laterality Date   CATARACT EXTRACTION     HERNIA REPAIR     melanoma removal     PROSTATE SURGERY      Social History: Social History   Socioeconomic History   Marital status: Married    Spouse name: Not on file   Number of children: Not on file   Years of education: Not on file   Highest education level: Not on file  Occupational History   Not on file  Tobacco Use   Smoking status: Never   Smokeless tobacco: Never  Vaping Use    Vaping status: Never Used  Substance and Sexual Activity   Alcohol use: No    Alcohol/week: 0.0 standard drinks of alcohol   Drug use: No   Sexual activity: Not Currently  Other Topics Concern   Not on file  Social History Narrative   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   Social Determinants of Health   Financial Resource Strain: Low Risk  (05/13/2020)   Overall Financial Resource Strain (CARDIA)    Difficulty of Paying Living Expenses: Not hard at all  Food Insecurity: No Food Insecurity (05/13/2020)   Hunger Vital Sign    Worried About Running Out of Food in the Last Year: Never true    Ran Out of Food in the Last Year: Never true  Transportation Needs: No Transportation Needs (05/13/2020)   PRAPARE - Administrator, Civil Service (Medical): No    Lack of Transportation (Non-Medical): No  Physical Activity: Insufficiently Active (05/13/2020)   Exercise Vital Sign    Days of Exercise per Week: 3  days    Minutes of Exercise per Session: 20 min  Stress: No Stress Concern Present (05/13/2020)   [employer] of Occupational Health - Occupational Stress Questionnaire    Feeling of Stress : Not at all  Social Connections: Moderately Integrated (05/13/2020)   Social Connection and Isolation Panel [NHANES]    Frequency of Communication with Friends and Family: Twice a week    Frequency of Social Gatherings with Friends and Family: Twice a week    Attends Religious Services: 1 to 4 times per year    Active Member of [employer] or Organizations: No    Attends Banker Meetings: Never    Marital Status: Married  Catering manager Violence: Not At Risk (05/13/2020)   Humiliation, Afraid, Rape, and Kick questionnaire    Fear of Current or Ex-Partner: No    Emotionally Abused: No    Physically Abused: No    Sexually Abused: No    Family History: Family History  Problem Relation Age of Onset   Multiple myeloma Mother    Acute  myelogenous leukemia Father    Leukemia Son        acute lymphocytic leukemia    Current Medications:  Current Outpatient Medications:    ACCU-CHEK AVIVA PLUS test strip, USE 1 STRIP TO CHECK GLUCOSE TWICE DAILY, Disp: , Rfl:    Accu-Chek FastClix Lancets MISC, Apply topically 2 (two) times daily., Disp: , Rfl:    acyclovir (ZOVIRAX) 400 MG tablet, Take 1 tablet by mouth twice daily, Disp: 60 tablet, Rfl: 0   aspirin EC 81 MG tablet, Take 1 tablet (81 mg total) by mouth daily. Swallow whole., Disp: 30 tablet, Rfl: 12   atenolol-chlorthalidone (TENORETIC) 50-25 MG per tablet, Take 1 tablet by mouth daily. Takes 1/2 daily per Dr Juanetta Gosling, Disp: , Rfl:    cyanocobalamin (VITAMIN B12) 1000 MCG/ML injection, Inject 1,000 mcg into the muscle every 30 (thirty) days., Disp: , Rfl:    Daratumumab-Hyaluronidase-fihj (DARZALEX FASPRO [place]), Inject into the skin., Disp: , Rfl:    dexamethasone (DECADRON) 4 MG tablet, Take 5 tablets (20 mg total) by mouth once a week., Disp: 30 tablet, Rfl: 6   diclofenac sodium (VOLTAREN) 1 % GEL, Apply 2 g topically daily as needed., Disp: , Rfl:    gabapentin (NEURONTIN) 300 MG capsule, Take 300 mg by mouth 3 (three) times daily., Disp: , Rfl:    hydrOXYzine (ATARAX) 25 MG tablet, Take 1 tablet (25 mg total) by mouth every 6 (six) hours as needed., Disp: 60 tablet, Rfl: 0   lenalidomide (REVLIMID) 10 MG capsule, Take 1 capsule (10 mg total) by mouth daily. 21 days on, 7 days off, Disp: 21 capsule, Rfl: 0   loperamide (IMODIUM A-D) 2 MG tablet, Take 2 mg by mouth 4 (four) times daily as needed for diarrhea or loose stools., Disp: , Rfl:    losartan (COZAAR) 50 MG tablet, Take 50 mg by mouth daily., Disp: , Rfl:    magnesium oxide (MAG-OX) 400 (240 Mg) MG tablet, Take 1 tablet (400 mg total) by mouth 2 (two) times daily., Disp: 120 tablet, Rfl: 3   metFORMIN (GLUCOPHAGE) 500 MG tablet, Take 500 mg by mouth 2 (two) times daily with a meal., Disp: , Rfl:    pantoprazole  (PROTONIX) 40 MG tablet, Take 40 mg by mouth daily., Disp: , Rfl:    prochlorperazine (COMPAZINE) 10 MG tablet, Take 1 tablet (10 mg total) by mouth every 6 (six) hours as needed for nausea or  vomiting., Disp: 30 tablet, Rfl: 3   silver sulfADIAZINE (SILVADENE) 1 % cream, Apply topically., Disp: , Rfl:    Allergies: Allergies  Allergen Reactions   Penicillins Rash    Also swelling reaction    Gadavist [Gadobutrol] Nausea Only    Patient became nauseated immediately after contrast injection.  Was better after a few minutes.     REVIEW OF SYSTEMS:   Review of Systems  Constitutional:  Negative for chills, fatigue and fever.  HENT:   Negative for lump/mass, mouth sores, nosebleeds, sore throat and trouble swallowing.   Eyes:  Negative for eye problems.  Respiratory:  Negative for cough and shortness of breath.   Cardiovascular:  Negative for chest pain, leg swelling and palpitations.  Gastrointestinal:  Negative for abdominal pain, constipation, nausea and vomiting.  Genitourinary:  Negative for bladder incontinence, difficulty urinating, dysuria, frequency, hematuria and nocturia.   Musculoskeletal:  Negative for arthralgias, back pain, flank pain, myalgias and neck pain.  Neurological:  Positive for numbness. Negative for dizziness and headaches.  Hematological:  Does not bruise/bleed easily.  Psychiatric/Behavioral:  Negative for depression and suicidal ideas. The patient is not nervous/anxious.   All other systems reviewed and are negative.    VITALS:   Blood pressure 122/77, pulse 89, temperature 97.8 F (36.6 C), temperature source Oral, resp. rate 18, weight 181 lb 9.6 oz (82.4 kg), SpO2 97%.  Wt Readings from Last 3 Encounters:  01/30/23 181 lb 9.6 oz (82.4 kg)  01/02/23 179 lb 14.4 oz (81.6 kg)  12/05/22 179 lb (81.2 kg)    Body mass index is 27.61 kg/m.  Performance status (ECOG): 1 - Symptomatic but completely ambulatory  PHYSICAL EXAM:   Physical Exam Vitals  and nursing note reviewed. Exam conducted with a chaperone present.  Constitutional:      Appearance: Normal appearance.  Cardiovascular:     Rate and Rhythm: Normal rate and regular rhythm.     Pulses: Normal pulses.     Heart sounds: Normal heart sounds.  Pulmonary:     Effort: Pulmonary effort is normal.     Breath sounds: Normal breath sounds.  Abdominal:     Palpations: Abdomen is soft. There is no hepatomegaly, splenomegaly or mass.     Tenderness: There is no abdominal tenderness.  Musculoskeletal:     Right lower leg: No edema.     Left lower leg: No edema.  Lymphadenopathy:     Cervical: No cervical adenopathy.     Right cervical: No superficial, deep or posterior cervical adenopathy.    Left cervical: No superficial, deep or posterior cervical adenopathy.     Upper Body:     Right upper body: No supraclavicular or axillary adenopathy.     Left upper body: No supraclavicular or axillary adenopathy.  Neurological:     General: No focal deficit present.     Mental Status: He is alert and oriented to person, place, and time.  Psychiatric:        Mood and Affect: Mood normal.        Behavior: Behavior normal.     LABS:      Latest Ref Rng & Units 01/23/2023    2:33 PM 01/02/2023   12:11 PM 12/05/2022    9:56 AM  CBC  WBC 4.0 - 10.5 K/uL 8.5  11.1  8.3   Hemoglobin 13.0 - 17.0 g/dL 24.4  01.0  27.2   Hematocrit 39.0 - 52.0 % 43.0  41.4  41.1  Platelets 150 - 400 K/uL 226  248  225       Latest Ref Rng & Units 01/23/2023    2:33 PM 01/02/2023   12:11 PM 12/05/2022   10:05 AM  CMP  Glucose 70 - 99 mg/dL 601  093  235   BUN 8 - 23 mg/dL 22  20  17    Creatinine 0.61 - 1.24 mg/dL 5.73  2.20  2.54   Sodium 135 - 145 mmol/L 137  139  138   Potassium 3.5 - 5.1 mmol/L 3.5  3.9  3.5   Chloride 98 - 111 mmol/L 99  102  100   CO2 22 - 32 mmol/L 27  29  27    Calcium 8.9 - 10.3 mg/dL 9.0  9.0  9.0   Total Protein 6.5 - 8.1 g/dL 6.8  6.3  6.2   Total Bilirubin 0.3 - 1.2  mg/dL 0.8  0.5  0.9   Alkaline Phos 38 - 126 U/L 91  106  98   AST 15 - 41 U/L 14  13  12    ALT 0 - 44 U/L 14  13  12       No results found for: "CEA1", "CEA" / No results found for: "CEA1", "CEA" No results found for: "PSA1" No results found for: "YHC623" No results found for: "CAN125"  Lab Results  Component Value Date   TOTALPROTELP 6.1 01/23/2023   ALBUMINELP 3.8 01/23/2023   A1GS 0.2 01/23/2023   A2GS 0.7 01/23/2023   BETS 1.0 01/23/2023   GAMS 0.4 01/23/2023   MSPIKE 0.1 (H) 01/23/2023   SPEI Comment 01/23/2023   No results found for: "TIBC", "FERRITIN", "IRONPCTSAT" Lab Results  Component Value Date   LDH 89 (L) 04/05/2022   LDH 93 (L) 01/01/2022   LDH 107 10/04/2021     STUDIES:   No results found.

## 2023-01-30 NOTE — Patient Instructions (Signed)

## 2023-01-30 NOTE — Progress Notes (Signed)
Patient presents today for [name] [name] and Zometa 4mg  IV infusion. Patient is in satisfactory condition with no new complaints voiced.  Vital signs are stable.  Labs reviewed by Dr. Ellin Saba form 01/23/23 during the office visit and all labs are within treatment parameters.  We will proceed with treatment per MD orders.   Treatment given today per MD orders. Tolerated infusion without adverse affects. Vital signs stable. No complaints at this time. Discharged from clinic ambulatory with cane in stable condition. Alert and oriented x 3. F/U with [facility] as scheduled.

## 2023-01-31 ENCOUNTER — Other Ambulatory Visit: Payer: Self-pay

## 2023-02-12 ENCOUNTER — Other Ambulatory Visit: Payer: Self-pay

## 2023-02-20 ENCOUNTER — Other Ambulatory Visit: Payer: Self-pay | Admitting: Hematology

## 2023-02-25 ENCOUNTER — Other Ambulatory Visit: Payer: Self-pay | Admitting: Hematology

## 2023-02-27 ENCOUNTER — Inpatient Hospital Stay: Payer: Medicare Other

## 2023-02-27 ENCOUNTER — Ambulatory Visit: Payer: Medicare Other | Admitting: Hematology

## 2023-03-04 ENCOUNTER — Inpatient Hospital Stay: Payer: Medicare Other

## 2023-03-04 ENCOUNTER — Inpatient Hospital Stay: Payer: Medicare Other | Attending: Hematology

## 2023-03-04 VITALS — BP 134/72 | HR 73 | Temp 96.2°F | Resp 18 | Wt 183.0 lb

## 2023-03-04 DIAGNOSIS — C9000 Multiple myeloma not having achieved remission: Secondary | ICD-10-CM | POA: Diagnosis not present

## 2023-03-04 DIAGNOSIS — Z5112 Encounter for antineoplastic immunotherapy: Secondary | ICD-10-CM | POA: Diagnosis not present

## 2023-03-04 LAB — CBC WITH DIFFERENTIAL/PLATELET
Abs Immature Granulocytes: 0.04 10*3/uL (ref 0.00–0.07)
Basophils Absolute: 0 10*3/uL (ref 0.0–0.1)
Basophils Relative: 0 %
Eosinophils Absolute: 0.4 10*3/uL (ref 0.0–0.5)
Eosinophils Relative: 4 %
HCT: 44.2 % (ref 39.0–52.0)
Hemoglobin: 15 g/dL (ref 13.0–17.0)
Immature Granulocytes: 0 %
Lymphocytes Relative: 22 %
Lymphs Abs: 1.9 10*3/uL (ref 0.7–4.0)
MCH: 31.6 pg (ref 26.0–34.0)
MCHC: 33.9 g/dL (ref 30.0–36.0)
MCV: 93.1 fL (ref 80.0–100.0)
Monocytes Absolute: 0.9 10*3/uL (ref 0.1–1.0)
Monocytes Relative: 10 %
Neutro Abs: 5.7 10*3/uL (ref 1.7–7.7)
Neutrophils Relative %: 64 %
Platelets: 232 10*3/uL (ref 150–400)
RBC: 4.75 MIL/uL (ref 4.22–5.81)
RDW: 13.6 % (ref 11.5–15.5)
WBC: 8.9 10*3/uL (ref 4.0–10.5)
nRBC: 0 % (ref 0.0–0.2)

## 2023-03-04 LAB — COMPREHENSIVE METABOLIC PANEL
ALT: 14 U/L (ref 0–44)
AST: 13 U/L — ABNORMAL LOW (ref 15–41)
Albumin: 4.1 g/dL (ref 3.5–5.0)
Alkaline Phosphatase: 105 U/L (ref 38–126)
Anion gap: 11 (ref 5–15)
BUN: 23 mg/dL (ref 8–23)
CO2: 29 mmol/L (ref 22–32)
Calcium: 9.8 mg/dL (ref 8.9–10.3)
Chloride: 97 mmol/L — ABNORMAL LOW (ref 98–111)
Creatinine, Ser: 1.18 mg/dL (ref 0.61–1.24)
GFR, Estimated: >60 mL/min (ref >60)
Glucose, Bld: 232 mg/dL — ABNORMAL HIGH (ref 70–99)
Potassium: 3.8 mmol/L (ref 3.5–5.1)
Sodium: 137 mmol/L (ref 135–145)
Total Bilirubin: 0.6 mg/dL (ref <1.2)
Total Protein: 6.8 g/dL (ref 6.5–8.1)

## 2023-03-04 LAB — MAGNESIUM: Magnesium: 1.9 mg/dL (ref 1.7–2.4)

## 2023-03-04 MED ORDER — DARATUMUMAB-HYALURONIDASE-FIHJ 1800-30000 MG-UT/15ML ~~LOC~~ SOLN
1800.0000 mg | Freq: Once | SUBCUTANEOUS | Status: AC
  Administered 2023-03-04: 1800 mg via SUBCUTANEOUS
  Filled 2023-03-04: qty 15

## 2023-03-04 MED ORDER — SODIUM CHLORIDE 0.9 % IV SOLN: INTRAVENOUS | Status: DC

## 2023-03-04 MED ORDER — ZOLEDRONIC ACID 4 MG/100ML IV SOLN
4.0000 mg | Freq: Once | INTRAVENOUS | Status: AC
  Administered 2023-03-04: 4 mg via INTRAVENOUS
  Filled 2023-03-04: qty 100

## 2023-03-04 NOTE — Progress Notes (Signed)
Patient presents today for Darzalex Faspro and Zometa 4mg  IV infusion.  Patient is in satisfactory condition with no new complaints voiced.  Vital signs are stable. Labs reviewed and all labs are within treatment parameters.  We will proceed with treatment per MD orders.  Patient took pre-meds at home prior to arrival. Peripheral IV started with good blood return pre and post infusion.  Treatment given today per MD orders. Tolerated infusion without adverse affects. Vital signs stable. No complaints at this time. Discharged from clinic ambulatory with cane in stable condition. Alert and oriented x 3. F/U with [facility] as scheduled.

## 2023-03-04 NOTE — Patient Instructions (Signed)
[facility] - A DEPT OF [facility]  Discharge Instructions: Thank you for choosing [facility] to provide your oncology and hematology care.  If you have a lab appointment with the Cancer Center - please note that after April 8th, 2024, all labs will be drawn in the cancer center.  You do not have to check in or register with the main entrance as you have in the past but will complete your check-in in the cancer center.  Wear comfortable clothing and clothing appropriate for easy access to any Portacath or PICC line.   We strive to give you quality time with your provider. You may need to reschedule your appointment if you arrive late (15 or more minutes).  Arriving late affects you and other patients whose appointments are after yours.  Also, if you miss three or more appointments without notifying the office, you may be dismissed from the clinic at the provider's discretion.      For prescription refill requests, have your pharmacy contact our office and allow 72 hours for refills to be completed.    Today you received the following chemotherapy and/or immunotherapy agents Dara Upsala and Zometa 4mg  IV.   To help prevent nausea and vomiting after your treatment, we encourage you to take your nausea medication as directed.  Daratumumab; Hyaluronidase Injection What is this medication? DARATUMUMAB; HYALURONIDASE (dar a toom ue mab; hye al ur ON i dase) treats multiple myeloma, a type of bone marrow cancer. Daratumumab works by blocking a protein that causes cancer cells to grow and multiply. This helps to slow or stop the spread of cancer cells. Hyaluronidase works by increasing the absorption of other medications in the body to help them work better. This medication may also be used treat amyloidosis, a condition that causes the buildup of a protein (amyloid) in your body. It works by reducing the buildup of this protein, which decreases symptoms. It is a  combination medication that contains a monoclonal antibody. This medicine may be used for other purposes; ask your health care provider or pharmacist if you have questions. COMMON BRAND NAME(S): DARZALEX FASPRO What should I tell my care team before I take this medication? They need to know if you have any of these conditions: Heart disease Infection, such as chickenpox, cold sores, herpes, hepatitis B Lung or breathing disease An unusual or allergic reaction to daratumumab, hyaluronidase, other medications, foods, dyes, or preservatives Pregnant or trying to get pregnant Breast-feeding How should I use this medication? This medication is injected under the skin. It is given by your care team in a hospital or clinic setting. Talk to your care team about the use of this medication in children. Special care may be needed. Overdosage: If you think you have taken too much of this medicine contact a poison control center or emergency room at once. NOTE: This medicine is only for you. Do not share this medicine with others. What if I miss a dose? Keep appointments for follow-up doses. It is important not to miss your dose. Call your care team if you are unable to keep an appointment. What may interact with this medication? Interactions have not been studied. This list may not describe all possible interactions. Give your health care provider a list of all the medicines, herbs, non-prescription drugs, or dietary supplements you use. Also tell them if you smoke, drink alcohol, or use illegal drugs. Some items may interact with your medicine. What  should I watch for while using this medication? Your condition will be monitored carefully while you are receiving this medication. This medication can cause serious allergic reactions. To reduce your risk, your care team may give you other medication to take before receiving this one. Be sure to follow the directions from your care team. This medication can  affect the results of blood tests to match your blood type. These changes can last for up to 6 months after the final dose. Your care team will do blood tests to match your blood type before you start treatment. Tell all of your care team that you are being treated with this medication before receiving a blood transfusion. This medication can affect the results of some tests used to determine treatment response; extra tests may be needed to evaluate response. Talk to your care team if you wish to become pregnant or think you are pregnant. This medication can cause serious birth defects if taken during pregnancy and for 3 months after the last dose. A reliable form of contraception is recommended while taking this medication and for 3 months after the last dose. Talk to your care team about effective forms of contraception. Do not breast-feed while taking this medication. What side effects may I notice from receiving this medication? Side effects that you should report to your care team as soon as possible: Allergic reactions--skin rash, itching, hives, swelling of the face, lips, tongue, or throat Heart rhythm changes--fast or irregular heartbeat, dizziness, feeling faint or lightheaded, chest pain, trouble breathing Infection--fever, chills, cough, sore throat, wounds that don't heal, pain or trouble when passing urine, general feeling of discomfort or being unwell Infusion reactions--chest pain, shortness of breath or trouble breathing, feeling faint or lightheaded Sudden eye pain or change in vision such as blurry vision, seeing halos around lights, vision loss Unusual bruising or bleeding Side effects that usually do not require medical attention (report to your care team if they continue or are bothersome): Constipation Diarrhea Fatigue Nausea Pain, tingling, or numbness in the hands or feet Swelling of the ankles, hands, or feet This list may not describe all possible side effects. Call your  doctor for medical advice about side effects. You may report side effects to FDA at 1-800-FDA-1088. Where should I keep my medication? This medication is given in a hospital or clinic. It will not be stored at home. NOTE: This sheet is a summary. It may not cover all possible information. If you have questions about this medicine, talk to your doctor, pharmacist, or health care provider.  2024 Elsevier/Gold Standard (2021-07-25 00:00:00)   BELOW ARE SYMPTOMS THAT SHOULD BE REPORTED IMMEDIATELY: *FEVER GREATER THAN 100.4 F (38 C) OR HIGHER *CHILLS OR SWEATING *NAUSEA AND VOMITING THAT IS NOT CONTROLLED WITH YOUR NAUSEA MEDICATION *UNUSUAL SHORTNESS OF BREATH *UNUSUAL BRUISING OR BLEEDING *URINARY PROBLEMS (pain or burning when urinating, or frequent urination) *BOWEL PROBLEMS (unusual diarrhea, constipation, pain near the anus) TENDERNESS IN MOUTH AND THROAT WITH OR WITHOUT PRESENCE OF ULCERS (sore throat, sores in mouth, or a toothache) UNUSUAL RASH, SWELLING OR PAIN  UNUSUAL VAGINAL DISCHARGE OR ITCHING   Items with * indicate a potential emergency and should be followed up as soon as possible or go to the Emergency Department if any problems should occur.  Please show the CHEMOTHERAPY ALERT CARD or IMMUNOTHERAPY ALERT CARD at check-in to the Emergency Department and triage nurse.  Should you have questions after your visit or need to cancel or reschedule your appointment, please  contact [facility] - A DEPT OF [facility] [phone]  and follow the prompts.  Office hours are 8:00 a.m. to 4:30 p.m. Monday - Friday. Please note that voicemails left after 4:00 p.m. may not be returned until the following business day.  We are closed weekends and major holidays. You have access to a nurse at all times for urgent questions. Please call the main number to the clinic [phone] and follow the prompts.  For any non-urgent questions, you may also contact your  provider using MyChart. We now offer e-Visits for anyone 13 and older to request care online for non-urgent symptoms. For details visit [email or web address].   Also download the MyChart app! Go to the app store, search "MyChart", open the app, select [place], and log in with your MyChart username and password.

## 2023-03-05 LAB — KAPPA/LAMBDA LIGHT CHAINS
Kappa free light chain: 12.3 mg/L (ref 3.3–19.4)
Kappa, lambda light chain ratio: 1.68 — ABNORMAL HIGH (ref 0.26–1.65)
Lambda free light chains: 7.3 mg/L (ref 5.7–26.3)

## 2023-03-07 LAB — PROTEIN ELECTROPHORESIS, SERUM
A/G Ratio: 1.8 — ABNORMAL HIGH (ref 0.7–1.7)
Albumin ELP: 3.9 g/dL (ref 2.9–4.4)
Alpha-1-Globulin: 0.2 g/dL (ref 0.0–0.4)
Alpha-2-Globulin: 0.7 g/dL (ref 0.4–1.0)
Beta Globulin: 0.9 g/dL (ref 0.7–1.3)
Gamma Globulin: 0.4 g/dL (ref 0.4–1.8)
Globulin, Total: 2.2 g/dL (ref 2.2–3.9)
M-Spike, %: 0.2 g/dL — ABNORMAL HIGH
Total Protein ELP: 6.1 g/dL (ref 6.0–8.5)

## 2023-03-22 ENCOUNTER — Other Ambulatory Visit: Payer: Self-pay

## 2023-03-22 MED ORDER — ACYCLOVIR 400 MG PO TABS: 400.0000 mg | ORAL_TABLET | Freq: Two times a day (BID) | ORAL | 0 refills | Status: DC

## 2023-03-29 ENCOUNTER — Encounter: Payer: Self-pay | Admitting: Hematology

## 2023-03-29 ENCOUNTER — Inpatient Hospital Stay: Payer: Medicare Other

## 2023-04-01 ENCOUNTER — Inpatient Hospital Stay: Payer: Medicare Other

## 2023-04-01 VITALS — BP 125/74 | HR 73 | Temp 97.7°F | Resp 18 | Wt 185.2 lb

## 2023-04-01 DIAGNOSIS — C9000 Multiple myeloma not having achieved remission: Secondary | ICD-10-CM | POA: Diagnosis not present

## 2023-04-01 DIAGNOSIS — Z5112 Encounter for antineoplastic immunotherapy: Secondary | ICD-10-CM | POA: Diagnosis not present

## 2023-04-01 LAB — CBC WITH DIFFERENTIAL/PLATELET
Abs Immature Granulocytes: 0.04 10*3/uL (ref 0.00–0.07)
Basophils Absolute: 0 10*3/uL (ref 0.0–0.1)
Basophils Relative: 0 %
Eosinophils Absolute: 0.4 10*3/uL (ref 0.0–0.5)
Eosinophils Relative: 5 %
HCT: 42.3 % (ref 39.0–52.0)
Hemoglobin: 14.4 g/dL (ref 13.0–17.0)
Immature Granulocytes: 1 %
Lymphocytes Relative: 23 %
Lymphs Abs: 1.9 10*3/uL (ref 0.7–4.0)
MCH: 31.9 pg (ref 26.0–34.0)
MCHC: 34 g/dL (ref 30.0–36.0)
MCV: 93.6 fL (ref 80.0–100.0)
Monocytes Absolute: 0.8 10*3/uL (ref 0.1–1.0)
Monocytes Relative: 10 %
Neutro Abs: 5 10*3/uL (ref 1.7–7.7)
Neutrophils Relative %: 61 %
Platelets: 212 10*3/uL (ref 150–400)
RBC: 4.52 MIL/uL (ref 4.22–5.81)
RDW: 13.2 % (ref 11.5–15.5)
WBC: 8.1 10*3/uL (ref 4.0–10.5)
nRBC: 0 % (ref 0.0–0.2)

## 2023-04-01 LAB — CMP (CANCER CENTER ONLY)
ALT: 15 U/L (ref 0–44)
AST: 14 U/L — ABNORMAL LOW (ref 15–41)
Albumin: 3.9 g/dL (ref 3.5–5.0)
Alkaline Phosphatase: 83 U/L (ref 38–126)
Anion gap: 11 (ref 5–15)
BUN: 17 mg/dL (ref 8–23)
CO2: 27 mmol/L (ref 22–32)
Calcium: 9 mg/dL (ref 8.9–10.3)
Chloride: 100 mmol/L (ref 98–111)
Creatinine: 1.18 mg/dL (ref 0.61–1.24)
GFR, Estimated: >60 mL/min (ref >60)
Glucose, Bld: 206 mg/dL — ABNORMAL HIGH (ref 70–99)
Potassium: 3.7 mmol/L (ref 3.5–5.1)
Sodium: 138 mmol/L (ref 135–145)
Total Bilirubin: 1.2 mg/dL — ABNORMAL HIGH (ref <1.2)
Total Protein: 6.5 g/dL (ref 6.5–8.1)

## 2023-04-01 LAB — MAGNESIUM: Magnesium: 1.7 mg/dL (ref 1.7–2.4)

## 2023-04-01 MED ORDER — DARATUMUMAB-HYALURONIDASE-FIHJ 1800-30000 MG-UT/15ML ~~LOC~~ SOLN
1800.0000 mg | Freq: Once | SUBCUTANEOUS | Status: AC
  Administered 2023-04-01: 1800 mg via SUBCUTANEOUS
  Filled 2023-04-01: qty 15

## 2023-04-01 MED ORDER — ZOLEDRONIC ACID 4 MG/100ML IV SOLN
4.0000 mg | Freq: Once | INTRAVENOUS | Status: AC
  Administered 2023-04-01: 4 mg via INTRAVENOUS
  Filled 2023-04-01: qty 100

## 2023-04-01 MED ORDER — SODIUM CHLORIDE 0.9 % IV SOLN: INTRAVENOUS | Status: DC

## 2023-04-01 NOTE — Patient Instructions (Signed)
[facility] [place] - A DEPT OF [facility]  Discharge Instructions: Thank you for choosing [facility] to provide your oncology and hematology care.  If you have a lab appointment with the Cancer Center - please note that after April 8th, 2024, all labs will be drawn in the cancer center.  You do not have to check in or register with the main entrance as you have in the past but will complete your check-in in the cancer center.  Wear comfortable clothing and clothing appropriate for easy access to any Portacath or PICC line.   We strive to give you quality time with your provider. You may need to reschedule your appointment if you arrive late (15 or more minutes).  Arriving late affects you and other patients whose appointments are after yours.  Also, if you miss three or more appointments without notifying the office, you may be dismissed from the clinic at the provider's discretion.      For prescription refill requests, have your pharmacy contact our office and allow 72 hours for refills to be completed.    Today you received the following chemotherapy and/or immunotherapy agents Daratumumab/Zometa      To help prevent nausea and vomiting after your treatment, we encourage you to take your nausea medication as directed.  BELOW ARE SYMPTOMS THAT SHOULD BE REPORTED IMMEDIATELY: *FEVER GREATER THAN 100.4 F (38 C) OR HIGHER *CHILLS OR SWEATING *NAUSEA AND VOMITING THAT IS NOT CONTROLLED WITH YOUR NAUSEA MEDICATION *UNUSUAL SHORTNESS OF BREATH *UNUSUAL BRUISING OR BLEEDING *URINARY PROBLEMS (pain or burning when urinating, or frequent urination) *BOWEL PROBLEMS (unusual diarrhea, constipation, pain near the anus) TENDERNESS IN MOUTH AND THROAT WITH OR WITHOUT PRESENCE OF ULCERS (sore throat, sores in mouth, or a toothache) UNUSUAL RASH, SWELLING OR PAIN  UNUSUAL VAGINAL DISCHARGE OR ITCHING   Items with * indicate a potential emergency and should be  followed up as soon as possible or go to the Emergency Department if any problems should occur.  Please show the CHEMOTHERAPY ALERT CARD or IMMUNOTHERAPY ALERT CARD at check-in to the Emergency Department and triage nurse.  Should you have questions after your visit or need to cancel or reschedule your appointment, please contact [facility] [place] - A DEPT OF Eligha Bridegroom [facility] [phone]  and follow the prompts.  Office hours are 8:00 a.m. to 4:30 p.m. Monday - Friday. Please note that voicemails left after 4:00 p.m. may not be returned until the following business day.  We are closed weekends and major holidays. You have access to a nurse at all times for urgent questions. Please call the main number to the clinic [phone] and follow the prompts.  For any non-urgent questions, you may also contact your provider using MyChart. We now offer e-Visits for anyone 21 and older to request care online for non-urgent symptoms. For details visit [email or web address].   Also download the MyChart app! Go to the app store, search "MyChart", open the app, select [place], and log in with your MyChart username and password.

## 2023-04-01 NOTE — Progress Notes (Signed)
Patient presents today for Daratumumab injection and Zometa infusion.  Vital signs and labs within parameters for treatment.  Patient took premedications at home.  Blood glucose is 206, patient declines insulin coverage at this time.  Stable during administration without incident; injection site WNL; see MAR for injection details.  Patient tolerated procedure well and without incident.  No questions or complaints noted at this time.   Discharge from clinic ambulatory in stable condition.  Alert and oriented X 3.  Follow up with [facility] as scheduled.

## 2023-04-02 LAB — KAPPA/LAMBDA LIGHT CHAINS
Kappa free light chain: 11.9 mg/L (ref 3.3–19.4)
Kappa, lambda light chain ratio: 2.2 — ABNORMAL HIGH (ref 0.26–1.65)
Lambda free light chains: 5.4 mg/L — ABNORMAL LOW (ref 5.7–26.3)

## 2023-04-08 LAB — PROTEIN ELECTROPHORESIS, SERUM
A/G Ratio: 1.7 (ref 0.7–1.7)
Albumin ELP: 3.7 g/dL (ref 2.9–4.4)
Alpha-1-Globulin: 0.2 g/dL (ref 0.0–0.4)
Alpha-2-Globulin: 0.7 g/dL (ref 0.4–1.0)
Beta Globulin: 0.9 g/dL (ref 0.7–1.3)
Gamma Globulin: 0.4 g/dL (ref 0.4–1.8)
Globulin, Total: 2.2 g/dL (ref 2.2–3.9)
Total Protein ELP: 5.9 g/dL — ABNORMAL LOW (ref 6.0–8.5)

## 2023-04-09 DIAGNOSIS — E782 Mixed hyperlipidemia: Secondary | ICD-10-CM | POA: Diagnosis not present

## 2023-04-09 DIAGNOSIS — E1122 Type 2 diabetes mellitus with diabetic chronic kidney disease: Secondary | ICD-10-CM | POA: Diagnosis not present

## 2023-04-16 DIAGNOSIS — D631 Anemia in chronic kidney disease: Secondary | ICD-10-CM | POA: Diagnosis not present

## 2023-04-16 DIAGNOSIS — C439 Malignant melanoma of skin, unspecified: Secondary | ICD-10-CM | POA: Diagnosis not present

## 2023-04-16 DIAGNOSIS — C9000 Multiple myeloma not having achieved remission: Secondary | ICD-10-CM | POA: Diagnosis not present

## 2023-04-16 DIAGNOSIS — E1140 Type 2 diabetes mellitus with diabetic neuropathy, unspecified: Secondary | ICD-10-CM | POA: Diagnosis not present

## 2023-04-16 DIAGNOSIS — I10 Essential (primary) hypertension: Secondary | ICD-10-CM | POA: Diagnosis not present

## 2023-04-16 DIAGNOSIS — E782 Mixed hyperlipidemia: Secondary | ICD-10-CM | POA: Diagnosis not present

## 2023-04-16 DIAGNOSIS — K219 Gastro-esophageal reflux disease without esophagitis: Secondary | ICD-10-CM | POA: Diagnosis not present

## 2023-04-16 DIAGNOSIS — E1122 Type 2 diabetes mellitus with diabetic chronic kidney disease: Secondary | ICD-10-CM | POA: Diagnosis not present

## 2023-04-16 DIAGNOSIS — E1142 Type 2 diabetes mellitus with diabetic polyneuropathy: Secondary | ICD-10-CM | POA: Diagnosis not present

## 2023-04-16 DIAGNOSIS — G720 Drug-induced myopathy: Secondary | ICD-10-CM | POA: Diagnosis not present

## 2023-04-16 DIAGNOSIS — N1831 Chronic kidney disease, stage 3a: Secondary | ICD-10-CM | POA: Diagnosis not present

## 2023-04-23 ENCOUNTER — Other Ambulatory Visit: Payer: Self-pay

## 2023-04-23 DIAGNOSIS — C9000 Multiple myeloma not having achieved remission: Secondary | ICD-10-CM

## 2023-04-24 ENCOUNTER — Inpatient Hospital Stay: Payer: Medicare Other | Attending: Hematology

## 2023-04-24 DIAGNOSIS — Z5112 Encounter for antineoplastic immunotherapy: Secondary | ICD-10-CM | POA: Insufficient documentation

## 2023-04-24 DIAGNOSIS — C9000 Multiple myeloma not having achieved remission: Secondary | ICD-10-CM | POA: Diagnosis not present

## 2023-04-24 DIAGNOSIS — Z79899 Other long term (current) drug therapy: Secondary | ICD-10-CM | POA: Diagnosis not present

## 2023-04-24 LAB — CBC WITH DIFFERENTIAL/PLATELET
Abs Immature Granulocytes: 0.05 10*3/uL (ref 0.00–0.07)
Basophils Absolute: 0.1 10*3/uL (ref 0.0–0.1)
Basophils Relative: 1 %
Eosinophils Absolute: 0.6 10*3/uL — ABNORMAL HIGH (ref 0.0–0.5)
Eosinophils Relative: 6 %
HCT: 43.5 % (ref 39.0–52.0)
Hemoglobin: 15.3 g/dL (ref 13.0–17.0)
Immature Granulocytes: 1 %
Lymphocytes Relative: 22 %
Lymphs Abs: 2.3 10*3/uL (ref 0.7–4.0)
MCH: 33 pg (ref 26.0–34.0)
MCHC: 35.2 g/dL (ref 30.0–36.0)
MCV: 93.8 fL (ref 80.0–100.0)
Monocytes Absolute: 1.1 10*3/uL — ABNORMAL HIGH (ref 0.1–1.0)
Monocytes Relative: 11 %
Neutro Abs: 6.2 10*3/uL (ref 1.7–7.7)
Neutrophils Relative %: 59 %
Platelets: 244 10*3/uL (ref 150–400)
RBC: 4.64 MIL/uL (ref 4.22–5.81)
RDW: 13.1 % (ref 11.5–15.5)
WBC: 10.3 10*3/uL (ref 4.0–10.5)
nRBC: 0 % (ref 0.0–0.2)

## 2023-04-24 LAB — COMPREHENSIVE METABOLIC PANEL
ALT: 15 U/L (ref 0–44)
AST: 16 U/L (ref 15–41)
Albumin: 4.2 g/dL (ref 3.5–5.0)
Alkaline Phosphatase: 87 U/L (ref 38–126)
Anion gap: 10 (ref 5–15)
BUN: 23 mg/dL (ref 8–23)
CO2: 31 mmol/L (ref 22–32)
Calcium: 9.6 mg/dL (ref 8.9–10.3)
Chloride: 101 mmol/L (ref 98–111)
Creatinine, Ser: 1.09 mg/dL (ref 0.61–1.24)
GFR, Estimated: >60 mL/min (ref >60)
Glucose, Bld: 135 mg/dL — ABNORMAL HIGH (ref 70–99)
Potassium: 4.3 mmol/L (ref 3.5–5.1)
Sodium: 142 mmol/L (ref 135–145)
Total Bilirubin: 0.6 mg/dL (ref 0.0–1.2)
Total Protein: 6.6 g/dL (ref 6.5–8.1)

## 2023-04-24 LAB — MAGNESIUM: Magnesium: 2 mg/dL (ref 1.7–2.4)

## 2023-04-25 LAB — KAPPA/LAMBDA LIGHT CHAINS
Kappa free light chain: 12.8 mg/L (ref 3.3–19.4)
Kappa, lambda light chain ratio: 1.71 — ABNORMAL HIGH (ref 0.26–1.65)
Lambda free light chains: 7.5 mg/L (ref 5.7–26.3)

## 2023-05-01 ENCOUNTER — Inpatient Hospital Stay: Payer: Medicare Other

## 2023-05-01 ENCOUNTER — Inpatient Hospital Stay: Payer: Medicare Other | Admitting: Hematology

## 2023-05-01 VITALS — BP 128/60 | HR 66 | Temp 97.8°F | Resp 16 | Wt 187.8 lb

## 2023-05-01 DIAGNOSIS — C9000 Multiple myeloma not having achieved remission: Secondary | ICD-10-CM

## 2023-05-01 DIAGNOSIS — Z5112 Encounter for antineoplastic immunotherapy: Secondary | ICD-10-CM | POA: Diagnosis not present

## 2023-05-01 DIAGNOSIS — Z79899 Other long term (current) drug therapy: Secondary | ICD-10-CM | POA: Diagnosis not present

## 2023-05-01 MED ORDER — ZOLEDRONIC ACID 4 MG/100ML IV SOLN
4.0000 mg | Freq: Once | INTRAVENOUS | Status: AC
  Administered 2023-05-01: 4 mg via INTRAVENOUS
  Filled 2023-05-01: qty 100

## 2023-05-01 MED ORDER — ACYCLOVIR 400 MG PO TABS: 400.0000 mg | ORAL_TABLET | Freq: Two times a day (BID) | ORAL | 3 refills | Status: DC

## 2023-05-01 MED ORDER — SODIUM CHLORIDE 0.9 % IV SOLN: INTRAVENOUS | Status: DC

## 2023-05-01 MED ORDER — DARATUMUMAB-HYALURONIDASE-FIHJ 1800-30000 MG-UT/15ML ~~LOC~~ SOLN
1800.0000 mg | Freq: Once | SUBCUTANEOUS | Status: AC
  Administered 2023-05-01: 1800 mg via SUBCUTANEOUS
  Filled 2023-05-01: qty 15

## 2023-05-01 MED ORDER — ACYCLOVIR 400 MG PO TABS: 400.0000 mg | ORAL_TABLET | Freq: Two times a day (BID) | ORAL | 0 refills | Status: DC

## 2023-05-01 MED ORDER — DEXAMETHASONE 4 MG PO TABS: 20.0000 mg | ORAL_TABLET | ORAL | 6 refills | Status: AC

## 2023-05-01 MED ORDER — ACETAMINOPHEN 325 MG PO TABS
650.0000 mg | ORAL_TABLET | Freq: Once | ORAL | Status: DC
  Filled 2023-05-01: qty 2

## 2023-05-01 MED ORDER — CETIRIZINE HCL 10 MG PO TABS
10.0000 mg | ORAL_TABLET | Freq: Every day | ORAL | Status: DC
  Filled 2023-05-01: qty 1

## 2023-05-01 NOTE — Patient Instructions (Signed)
[facility] [place] - [facility] DE[facility]  Discharge Instructions: Thank you for choosing [facility] to provide your oncology and hematology care.  If you have a lab appointment with the Cancer Center - please note that after April 8th, 2024, all labs will be drawn in the cancer center.  You do not have to check in or register with the main entrance as you have in the past but will complete your check-in in the cancer center.  Wear comfortable clothing and clothing appropriate for easy access to any Portacath or PICC line.   We strive to give you quality time with your provider. You may need to reschedule your appointment if you arrive late (15 or more minutes).  Arriving late affects you and other patients whose appointments are after yours.  Also, if you miss three or more appointments without notifying the office, you may be dismissed from the clinic at the provider's discretion.      For prescription refill requests, have your pharmacy contact our office and allow 72 hours for refills to be completed.    Today you received the following chemotherapy and/or immunotherapy agents Darzalex Faspro and Zometa. Zoledronic Acid Injection (Cancer) What is this medication? ZOLEDRONIC ACID (ZOE le dron ik AS id) treats high calcium levels in the blood caused by cancer. It may also be used with chemotherapy to treat weakened bones caused by cancer. It works by slowing down the release of calcium from bones. This lowers calcium levels in your blood. It also makes your bones stronger and less likely to break (fracture). It belongs to a group of medications called bisphosphonates. This medicine may be used for other purposes; ask your health care provider or pharmacist if you have questions. COMMON BRAND NAME(S): Zometa, Zometa Powder What should I tell my care team before I take this medication? They need to know if you have any of these  conditions: Dehydration Dental disease Kidney disease Liver disease Low levels of calcium in the blood Lung or breathing disease, such as asthma Receiving steroids, such as dexamethasone or prednisone An unusual or allergic reaction to zoledronic acid, other medications, foods, dyes, or preservatives Pregnant or trying to get pregnant Breast-feeding How should I use this medication? This medication is injected into a vein. It is given by your care team in a hospital or clinic setting. Talk to your care team about the use of this medication in children. Special care may be needed. Overdosage: If you think you have taken too much of this medicine contact a poison control center or emergency room at once. NOTE: This medicine is only for you. Do not share this medicine with others. What if I miss a dose? Keep appointments for follow-up doses. It is important not to miss your dose. Call your care team if you are unable to keep an appointment. What may interact with this medication? Certain antibiotics given by injection Diuretics, such as bumetanide, furosemide NSAIDs, medications for pain and inflammation, such as ibuprofen or naproxen Teriparatide Thalidomide This list may not describe all possible interactions. Give your health care provider a list of all the medicines, herbs, non-prescription drugs, or dietary supplements you use. Also tell them if you smoke, drink alcohol, or use illegal drugs. Some items may interact with your medicine. What should I watch for while using this medication? Visit your care team for regular checks on your progress. It may be some time before you see the  benefit from this medication. Some people who take this medication have severe bone, joint, or muscle pain. This medication may also increase your risk for jaw problems or a broken thigh bone. Tell your care team right away if you have severe pain in your jaw, bones, joints, or muscles. Tell you care team if  you have any pain that does not go away or that gets worse. Tell your dentist and dental surgeon that you are taking this medication. You should not have major dental surgery while on this medication. See your dentist to have a dental exam and fix any dental problems before starting this medication. Take good care of your teeth while on this medication. Make sure you see your dentist for regular follow-up appointments. You should make sure you get enough calcium and vitamin D while you are taking this medication. Discuss the foods you eat and the vitamins you take with your care team. Check with your care team if you have severe diarrhea, nausea, and vomiting, or if you sweat a lot. The loss of too much body fluid may make it dangerous for you to take this medication. You may need bloodwork while taking this medication. Talk to your care team if you wish to become pregnant or think you might be pregnant. This medication can cause serious birth defects. What side effects may I notice from receiving this medication? Side effects that you should report to your care team as soon as possible: Allergic reactions--skin rash, itching, hives, swelling of the face, lips, tongue, or throat Kidney injury--decrease in the amount of urine, swelling of the ankles, hands, or feet Low calcium level--muscle pain or cramps, confusion, tingling, or numbness in the hands or feet Osteonecrosis of the jaw--pain, swelling, or redness in the mouth, numbness of the jaw, poor healing after dental work, unusual discharge from the mouth, visible bones in the mouth Severe bone, joint, or muscle pain Side effects that usually do not require medical attention (report to your care team if they continue or are bothersome): Constipation Fatigue Fever Loss of appetite Nausea Stomach pain This list may not describe all possible side effects. Call your doctor for medical advice about side effects. You may report side effects to FDA  at 1-800-FDA-1088. Where should I keep my medication? This medication is given in a hospital or clinic. It will not be stored at home. NOTE: This sheet is a summary. It may not cover all possible information. If you have questions about this medicine, talk to your doctor, pharmacist, or health care provider.  2024 Elsevier/Gold Standard (2021-05-12 00:00:00)Daratumumab; Hyaluronidase Injection What is this medication? DARATUMUMAB; HYALURONIDASE (dar a toom ue mab; hye al ur ON i dase) treats multiple myeloma, a type of bone marrow cancer. Daratumumab works by blocking a protein that causes cancer cells to grow and multiply. This helps to slow or stop the spread of cancer cells. Hyaluronidase works by increasing the absorption of other medications in the body to help them work better. This medication may also be used treat amyloidosis, a condition that causes the buildup of a protein (amyloid) in your body. It works by reducing the buildup of this protein, which decreases symptoms. It is a combination medication that contains a monoclonal antibody. This medicine may be used for other purposes; ask your health care provider or pharmacist if you have questions. COMMON BRAND NAME(S): DARZALEX FASPRO What should I tell my care team before I take this medication? They need to know if you have any  of these conditions: Heart disease Infection, such as chickenpox, cold sores, herpes, hepatitis B Lung or breathing disease An unusual or allergic reaction to daratumumab, hyaluronidase, other medications, foods, dyes, or preservatives Pregnant or trying to get pregnant Breast-feeding How should I use this medication? This medication is injected under the skin. It is given by your care team in a hospital or clinic setting. Talk to your care team about the use of this medication in children. Special care may be needed. Overdosage: If you think you have taken too much of this medicine contact a poison control  center or emergency room at once. NOTE: This medicine is only for you. Do not share this medicine with others. What if I miss a dose? Keep appointments for follow-up doses. It is important not to miss your dose. Call your care team if you are unable to keep an appointment. What may interact with this medication? Interactions have not been studied. This list may not describe all possible interactions. Give your health care provider a list of all the medicines, herbs, non-prescription drugs, or dietary supplements you use. Also tell them if you smoke, drink alcohol, or use illegal drugs. Some items may interact with your medicine. What should I watch for while using this medication? Your condition will be monitored carefully while you are receiving this medication. This medication can cause serious allergic reactions. To reduce your risk, your care team may give you other medication to take before receiving this one. Be sure to follow the directions from your care team. This medication can affect the results of blood tests to match your blood type. These changes can last for up to 6 months after the final dose. Your care team will do blood tests to match your blood type before you start treatment. Tell all of your care team that you are being treated with this medication before receiving a blood transfusion. This medication can affect the results of some tests used to determine treatment response; extra tests may be needed to evaluate response. Talk to your care team if you wish to become pregnant or think you are pregnant. This medication can cause serious birth defects if taken during pregnancy and for 3 months after the last dose. A reliable form of contraception is recommended while taking this medication and for 3 months after the last dose. Talk to your care team about effective forms of contraception. Do not breast-feed while taking this medication. What side effects may I notice from receiving  this medication? Side effects that you should report to your care team as soon as possible: Allergic reactions--skin rash, itching, hives, swelling of the face, lips, tongue, or throat Heart rhythm changes--fast or irregular heartbeat, dizziness, feeling faint or lightheaded, chest pain, trouble breathing Infection--fever, chills, cough, sore throat, wounds that don't heal, pain or trouble when passing urine, general feeling of discomfort or being unwell Infusion reactions--chest pain, shortness of breath or trouble breathing, feeling faint or lightheaded Sudden eye pain or change in vision such as blurry vision, seeing halos around lights, vision loss Unusual bruising or bleeding Side effects that usually do not require medical attention (report to your care team if they continue or are bothersome): Constipation Diarrhea Fatigue Nausea Pain, tingling, or numbness in the hands or feet Swelling of the ankles, hands, or feet This list may not describe all possible side effects. Call your doctor for medical advice about side effects. You may report side effects to FDA at 1-800-FDA-1088. Where should I keep my  medication? This medication is given in a hospital or clinic. It will not be stored at home. NOTE: This sheet is a summary. It may not cover all possible information. If you have questions about this medicine, talk to your doctor, pharmacist, or health care provider.  2024 Elsevier/Gold Standard (2021-07-25 00:00:00)      To help prevent nausea and vomiting after your treatment, we encourage you to take your nausea medication as directed.  BELOW ARE SYMPTOMS THAT SHOULD BE REPORTED IMMEDIATELY: *FEVER GREATER THAN 100.4 F (38 C) OR HIGHER *CHILLS OR SWEATING *NAUSEA AND VOMITING THAT IS NOT CONTROLLED WITH YOUR NAUSEA MEDICATION *UNUSUAL SHORTNESS OF BREATH *UNUSUAL BRUISING OR BLEEDING *URINARY PROBLEMS (pain or burning when urinating, or frequent urination) *BOWEL PROBLEMS  (unusual diarrhea, constipation, pain near the anus) TENDERNESS IN MOUTH AND THROAT WITH OR WITHOUT PRESENCE OF ULCERS (sore throat, sores in mouth, or a toothache) UNUSUAL RASH, SWELLING OR PAIN  UNUSUAL VAGINAL DISCHARGE OR ITCHING   Items with * indicate a potential emergency and should be followed up as soon as possible or go to the Emergency Department if any problems should occur.  Please show the CHEMOTHERAPY ALERT CARD or IMMUNOTHERAPY ALERT CARD at check-in to the Emergency Department and triage nurse.  Should you have questions after your visit or need to cancel or reschedule your appointment, please contact [facility] [place] - A DEPT OF Eligha Bridegroom [facility] [phone]  and follow the prompts.  Office hours are 8:00 a.m. to 4:30 p.m. Monday - Friday. Please note that voicemails left after 4:00 p.m. may not be returned until the following business day.  We are closed weekends and major holidays. You have access to a nurse at all times for urgent questions. Please call the main number to the clinic [phone] and follow the prompts.  For any non-urgent questions, you may also contact your provider using MyChart. We now offer e-Visits for anyone 27 and older to request care online for non-urgent symptoms. For details visit [email or web address].   Also download the MyChart app! Go to the app store, search "MyChart", open the app, select [place], and log in with your MyChart username and password.

## 2023-05-01 NOTE — Progress Notes (Signed)
[facility] [address] [place] [address]    Clinic Day:  05/01/2023  Referring physician: Benita Stabile, MD  Patient Care Team: Benita Stabile, MD as PCP - General (Internal Medicine) Wyline Mood Dorothe Pea, MD as Consulting Physician (Cardiology) Doreatha Massed, MD as Medical Oncologist (Medical Oncology)   ASSESSMENT & PLAN:   Assessment: IgG kappa smoldering multiple myeloma: - Skeletal survey in February 2022 was negative. - Bone marrow biopsy on 06/06/2020 with 20% plasma cells.  Myeloma FISH panel with monosomy 13/deletion 13.  Chromosome analysis 46, XY (20). - No "crab" features. - PET scan from 06/29/2021: No evidence of myeloma or plasmacytoma. - Skeletal survey on 06/13/2021: No lytic lesions. - MRI of the cervical/thoracic/lumbar spine negative for bone lesions with some degenerative changes. - BMBX (02/01/2022): Hypercellular marrow with plasma cells representing 35% of all cells.  They display kappa light chain restriction.  Cytogenetics-46, XY (20). - Myeloma FISH panel: Monosomy 13. - PET scan (04/05/2022): No suspicious bony lesions or soft tissue masses. - Daratumumab, Revlimid and dexamethasone started on 05/01/2022, could not tolerate Revlimid 15 mg dose as he had near syncope, Revlimid 15 mg stopped on 07/05/2022, dose decreased to 10 mg 3 weeks on/1 week off.  Revlimid 10 mg discontinued on 11/23/2022 due to recurrent rash and itching.   2.  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 - No major chemical exposures.  He had personal history of melanoma of the upper lip. - Father had acute myeloid leukemia.  Mother had multiple myeloma.  Son died of acute lymphocytic leukemia    Plan: 1.  Stage II IgG kappa multiple myeloma, standard risk: - He has been off of Revlimid due to recurrent rash and itching. - He is tolerating Darzalex monthly very well. - Reviewed myeloma labs from 04/24/2023.   M spike is pending.  M spike from 04/01/2023 was undetectable.  Kappa light chains are normal at 12.8 with ratio of 1.71.  CBC and LFTs are normal. - Continue Darzalex monthly.  RTC 3 months for follow-up with repeat myeloma labs and Dara specific immunofixation.  2.  Peripheral neuropathy: - He will continue gabapentin 300 mg 4 times daily for neuropathy in the feet.   3.  Prophylaxis: - Continue acyclovir 400 mg twice daily and aspirin 81 mg daily.   4.  Osteoporosis (DEXA 09/11/2022 T-score -3.5): - Zometa was started on 01/30/2023.  He received 3 infusions so far.  Reports that his feet hurt for 2-3 nights after each dose of Zometa.  Calcium today is 9.6.  Continue calcium supplements.   5.  Hypomagnesemia: - He is taking magnesium OTC pills 250 mg 3 times daily.  I have told him to cut back to once daily.    No orders of the defined types were placed in this encounter.     Doreatha Massed, MD   1/29/202510:56 AM  CHIEF COMPLAINT:   Diagnosis: IgG kappa multiple myeloma    Cancer Staging  Multiple myeloma (HCC) Staging form: Plasma Cell Myeloma and Plasma Cell Disorders, AJCC 8th Edition - Clinical stage from 04/11/2022: RISS Stage II (Beta-2-microglobulin (mg/L): 4.1, Albumin (g/dL): 2.7, ISS: Stage II, High-risk cytogenetics: Absent, LDH: Normal) - Signed by Doreatha Massed, MD on 04/11/2022    Prior Therapy: Daratumumab, Revlimid and dexamethasone   Current Therapy: Monthly daratumumab   HISTORY OF PRESENT ILLNESS:   Oncology History  Multiple myeloma (HCC)  04/11/2022  Initial Diagnosis   Multiple myeloma (HCC)   04/11/2022 Cancer Staging   Staging form: Plasma Cell Myeloma and Plasma Cell Disorders, AJCC 8th Edition - Clinical stage from 04/11/2022: RISS Stage II (Beta-2-microglobulin (mg/L): 4.1, Albumin (g/dL): 2.7, ISS: Stage II, High-risk cytogenetics: Absent, LDH: Normal) - Signed by Doreatha Massed, MD on 04/11/2022 Histopathologic type: Multiple  myeloma Beta 2 microglobulin range (mg/L): 3.5 to 5.49 Albumin range (g/dL): Less than 3.5 Cytogenetics: No abnormalities   05/01/2022 -  Chemotherapy   Patient is on Treatment Plan : MYELOMA  Daratumumab SQ + Lenalidomide + Dexamethasone (DaraRd) q28d        INTERVAL HISTORY:   [name] [name] is a 78 y.o. male seen for follow-up of multiple myeloma.  He reports appetite of 75% and energy levels of 50%.  Denies any infections since last visit.   PAST MEDICAL HISTORY:   Past Medical History: Past Medical History:  Diagnosis Date   Adverse effect of unspecified drugs, medicaments and biological substances, initial encounter    Essential (primary) hypertension    Hyperlipidemia, unspecified    Impotence of organic origin    Malignant melanoma of skin, unspecified (HCC)    Malignant neoplasm of prostate (HCC)    Multiple myeloma (HCC)    Neuropathy    Non-pressure chronic ulcer of other part of unspecified foot with unspecified severity (HCC)    Reflux esophagitis    Restless legs syndrome    Tinea unguium    Type 2 diabetes mellitus with diabetic neuropathy, unspecified (HCC)    Type 2 diabetes mellitus with other specified complication (HCC)    Unspecified atrial fibrillation (HCC)     Surgical History: Past Surgical History:  Procedure Laterality Date   CATARACT EXTRACTION     HERNIA REPAIR     melanoma removal     PROSTATE SURGERY      Social History: Social History   Socioeconomic History   Marital status: Married    Spouse name: Not on file   Number of children: Not on file   Years of education: Not on file   Highest education level: Not on file  Occupational History   Not on file  Tobacco Use   Smoking status: Never   Smokeless tobacco: Never  Vaping Use   Vaping status: Never Used  Substance and Sexual Activity   Alcohol use: No    Alcohol/week: 0.0 standard drinks of alcohol   Drug use: No   Sexual activity: Not Currently  Other Topics Concern   Not on  file  Social History Narrative   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   Social Drivers of Corporate investment banker Strain: Low Risk  (05/13/2020)   Overall Financial Resource Strain (CARDIA)    Difficulty of Paying Living Expenses: Not hard at all  Food Insecurity: No Food Insecurity (05/13/2020)   Hunger Vital Sign    Worried About Running Out of Food in the Last Year: Never true    Ran Out of Food in the Last Year: Never true  Transportation Needs: No Transportation Needs (05/13/2020)   PRAPARE - Administrator, Civil Service (Medical): No    Lack of Transportation (Non-Medical): No  Physical Activity: Insufficiently Active (05/13/2020)   Exercise Vital Sign    Days of Exercise per Week: 3 days    Minutes of Exercise per Session: 20 min  Stress: No Stress Concern Present (05/13/2020)   [employer] of Occupational  Health - Occupational Stress Questionnaire    Feeling of Stress : Not at all  Social Connections: Moderately Integrated (05/13/2020)   Social Connection and Isolation Panel [NHANES]    Frequency of Communication with Friends and Family: Twice a week    Frequency of Social Gatherings with Friends and Family: Twice a week    Attends Religious Services: 1 to 4 times per year    Active Member of [employer] or Organizations: No    Attends Banker Meetings: Never    Marital Status: Married  Catering manager Violence: Not At Risk (05/13/2020)   Humiliation, Afraid, Rape, and Kick questionnaire    Fear of Current or Ex-Partner: No    Emotionally Abused: No    Physically Abused: No    Sexually Abused: No    Family History: Family History  Problem Relation Age of Onset   Multiple myeloma Mother    Acute myelogenous leukemia Father    Leukemia Son        acute lymphocytic leukemia    Current Medications:  Current Outpatient Medications:    ACCU-CHEK AVIVA PLUS test strip, USE 1 STRIP TO CHECK GLUCOSE TWICE  DAILY, Disp: , Rfl:    Accu-Chek FastClix Lancets MISC, Apply topically 2 (two) times daily., Disp: , Rfl:    acyclovir (ZOVIRAX) 400 MG tablet, Take 1 tablet (400 mg total) by mouth 2 (two) times daily., Disp: 60 tablet, Rfl: 0   aspirin EC 81 MG tablet, Take 1 tablet (81 mg total) by mouth daily. Swallow whole. (Patient taking differently: Take 81 mg by mouth daily. Swallow whole. Takes 3 times a week), Disp: 30 tablet, Rfl: 12   atenolol-chlorthalidone (TENORETIC) 50-25 MG per tablet, Take 1 tablet by mouth daily. Takes 1/2 daily per Dr Juanetta Gosling, Disp: , Rfl:    cyanocobalamin (VITAMIN B12) 1000 MCG/ML injection, Inject 1,000 mcg into the muscle every 30 (thirty) days., Disp: , Rfl:    Daratumumab-Hyaluronidase-fihj (DARZALEX FASPRO [place]), Inject into the skin., Disp: , Rfl:    dexamethasone (DECADRON) 4 MG tablet, Take 5 tablets (20 mg total) by mouth once a week., Disp: 30 tablet, Rfl: 6   diclofenac sodium (VOLTAREN) 1 % GEL, Apply 2 g topically daily as needed., Disp: , Rfl:    gabapentin (NEURONTIN) 300 MG capsule, Take 300 mg by mouth 3 (three) times daily., Disp: , Rfl:    hydrOXYzine (ATARAX) 25 MG tablet, Take 1 tablet (25 mg total) by mouth every 6 (six) hours as needed., Disp: 60 tablet, Rfl: 0   loperamide (IMODIUM A-D) 2 MG tablet, Take 2 mg by mouth 4 (four) times daily as needed for diarrhea or loose stools., Disp: , Rfl:    losartan (COZAAR) 50 MG tablet, Take 50 mg by mouth daily., Disp: , Rfl:    MAGNESIUM-OXIDE 400 (240 Mg) MG tablet, Take 1 tablet by mouth twice daily, Disp: 120 tablet, Rfl: 0   metFORMIN (GLUCOPHAGE) 500 MG tablet, Take 500 mg by mouth 2 (two) times daily with a meal., Disp: , Rfl:    pantoprazole (PROTONIX) 40 MG tablet, Take 40 mg by mouth daily., Disp: , Rfl:    prochlorperazine (COMPAZINE) 10 MG tablet, Take 1 tablet (10 mg total) by mouth every 6 (six) hours as needed for nausea or vomiting., Disp: 30 tablet, Rfl: 3   silver sulfADIAZINE (SILVADENE) 1 %  cream, Apply topically., Disp: , Rfl:    lenalidomide (REVLIMID) 10 MG capsule, Take 1 capsule (10 mg total) by mouth daily. 21  days on, 7 days off (Patient not taking: Reported on 05/01/2023), Disp: 21 capsule, Rfl: 0   Allergies: Allergies  Allergen Reactions   Penicillins Rash    Also swelling reaction    Gadavist [Gadobutrol] Nausea Only    Patient became nauseated immediately after contrast injection.  Was better after a few minutes.     REVIEW OF SYSTEMS:   Review of Systems  Constitutional:  Negative for chills, fatigue and fever.  HENT:   Negative for lump/mass, mouth sores, nosebleeds, sore throat and trouble swallowing.   Eyes:  Negative for eye problems.  Respiratory:  Negative for cough and shortness of breath.   Cardiovascular:  Negative for chest pain, leg swelling and palpitations.  Gastrointestinal:  Negative for abdominal pain, constipation, nausea and vomiting.  Genitourinary:  Negative for bladder incontinence, difficulty urinating, dysuria, frequency, hematuria and nocturia.   Musculoskeletal:  Negative for arthralgias, back pain, flank pain, myalgias and neck pain.  Neurological:  Positive for numbness. Negative for dizziness and headaches.  Psychiatric/Behavioral:  Negative for depression and suicidal ideas. The patient is not nervous/anxious.   All other systems reviewed and are negative.    VITALS:   Blood pressure 128/60, pulse 66, temperature 97.8 F (36.6 C), temperature source Oral, resp. rate 16, weight 187 lb 13.3 oz (85.2 kg), SpO2 100%.  Wt Readings from Last 3 Encounters:  05/01/23 187 lb 13.3 oz (85.2 kg)  04/01/23 185 lb 3.2 oz (84 kg)  03/04/23 183 lb (83 kg)    Body mass index is 28.56 kg/m.  Performance status (ECOG): 1 - Symptomatic but completely ambulatory  PHYSICAL EXAM:   Physical Exam Vitals and nursing note reviewed. Exam conducted with a chaperone present.  Constitutional:      Appearance: Normal appearance.  Cardiovascular:      Rate and Rhythm: Normal rate and regular rhythm.     Pulses: Normal pulses.     Heart sounds: Normal heart sounds.  Pulmonary:     Effort: Pulmonary effort is normal.     Breath sounds: Normal breath sounds.  Abdominal:     Palpations: Abdomen is soft. There is no hepatomegaly, splenomegaly or mass.     Tenderness: There is no abdominal tenderness.  Musculoskeletal:     Right lower leg: No edema.     Left lower leg: No edema.  Lymphadenopathy:     Cervical: No cervical adenopathy.     Right cervical: No superficial, deep or posterior cervical adenopathy.    Left cervical: No superficial, deep or posterior cervical adenopathy.     Upper Body:     Right upper body: No supraclavicular or axillary adenopathy.     Left upper body: No supraclavicular or axillary adenopathy.  Neurological:     General: No focal deficit present.     Mental Status: He is alert and oriented to person, place, and time.  Psychiatric:        Mood and Affect: Mood normal.        Behavior: Behavior normal.     LABS:      Latest Ref Rng & Units 04/24/2023    1:24 PM 04/01/2023    7:57 AM 03/04/2023    7:42 AM  CBC  WBC 4.0 - 10.5 K/uL 10.3  8.1  8.9   Hemoglobin 13.0 - 17.0 g/dL 01.0  27.2  53.6   Hematocrit 39.0 - 52.0 % 43.5  42.3  44.2   Platelets 150 - 400 K/uL 244  212  232  Latest Ref Rng & Units 04/24/2023    1:24 PM 04/01/2023    7:57 AM 03/04/2023    7:42 AM  CMP  Glucose 70 - 99 mg/dL 161  096  045   BUN 8 - 23 mg/dL 23  17  23    Creatinine 0.61 - 1.24 mg/dL 4.09  8.11  9.14   Sodium 135 - 145 mmol/L 142  138  137   Potassium 3.5 - 5.1 mmol/L 4.3  3.7  3.8   Chloride 98 - 111 mmol/L 101  100  97   CO2 22 - 32 mmol/L 31  27  29    Calcium 8.9 - 10.3 mg/dL 9.6  9.0  9.8   Total Protein 6.5 - 8.1 g/dL 6.6  6.5  6.8   Total Bilirubin 0.0 - 1.2 mg/dL 0.6  1.2  0.6   Alkaline Phos 38 - 126 U/L 87  83  105   AST 15 - 41 U/L 16  14  13    ALT 0 - 44 U/L 15  15  14       No results  found for: "CEA1", "CEA" / No results found for: "CEA1", "CEA" No results found for: "PSA1" No results found for: "NWG956" No results found for: "CAN125"  Lab Results  Component Value Date   TOTALPROTELP 5.9 (L) 04/01/2023   ALBUMINELP 3.7 04/01/2023   A1GS 0.2 04/01/2023   A2GS 0.7 04/01/2023   BETS 0.9 04/01/2023   GAMS 0.4 04/01/2023   MSPIKE Not Observed 04/01/2023   SPEI Comment 04/01/2023   No results found for: "TIBC", "FERRITIN", "IRONPCTSAT" Lab Results  Component Value Date   LDH 89 (L) 04/05/2022   LDH 93 (L) 01/01/2022   LDH 107 10/04/2021     STUDIES:   No results found.

## 2023-05-01 NOTE — Progress Notes (Signed)
Patient has been examined by Dr. Ellin Saba. Vital signs and labs have been reviewed by MD - ANC, Creatinine, LFTs, hemoglobin, and platelets are within treatment parameters per M.D. - pt may proceed with treatment.  Primary RN and pharmacy notified.

## 2023-05-01 NOTE — Patient Instructions (Signed)
[facility] at [facility] Discharge Instructions   You were seen and examined today by Dr. Ellin Saba.  He reviewed the results of your lab work which are normal/stable.   We will proceed with your treatment today.  Return as scheduled.    Thank you for choosing [facility] at [facility] to provide your oncology and hematology care.  To afford each patient quality time with our provider, please arrive at least 15 minutes before your scheduled appointment time.   If you have a lab appointment with the Cancer Center please come in thru the Main Entrance and check in at the main information desk.  You need to re-schedule your appointment should you arrive 10 or more minutes late.  We strive to give you quality time with our providers, and arriving late affects you and other patients whose appointments are after yours.  Also, if you no show three or more times for appointments you may be dismissed from the clinic at the providers discretion.     Again, thank you for choosing [facility].  Our hope is that these requests will decrease the amount of time that you wait before being seen by our physicians.       _____________________________________________________________  Should you have questions after your visit to [facility], please contact our office at [phone] and follow the prompts.  Our office hours are 8:00 a.m. and 4:30 p.m. Monday - Friday.  Please note that voicemails left after 4:00 p.m. may not be returned until the following business day.  We are closed weekends and major holidays.  You do have access to a nurse 24-7, just call the main number to the clinic [phone] and do not press any options, hold on the line and a nurse will answer the phone.    For prescription refill requests, have your pharmacy contact our office and allow 72 hours.    Due to Covid, you will need to wear a mask upon entering  the hospital. If you do not have a mask, a mask will be given to you at the Main Entrance upon arrival. For doctor visits, patients may have 1 support person age 67 or older with them. For treatment visits, patients can not have anyone with them due to social distancing guidelines and our immunocompromised population.

## 2023-05-01 NOTE — Progress Notes (Signed)
Patient presents today for follow up visit with Dr. Ellin Saba and Darzalex Faspro injection. Patient should receive Zometa 4mg  IVPB today. Vital signs stable. Labs drawn on 04-24-2023. Calcium 9.6. Creatinine 1.09.   Message received from A.Dareen Piano RN / Dr. Ellin Saba to proceed with treatment. Labs reviewed by MD.   Patient took pre-medications at home prior to arrival. 10:00 am. Zyrtec 10 mg and Tylenol 650 mg.   Treatment given today per MD orders. Tolerated infusion without adverse affects. Vital signs stable. No complaints at this time. Discharged from clinic ambulatory in stable condition. Alert and oriented x 3. F/U with [facility] as scheduled.

## 2023-05-02 ENCOUNTER — Other Ambulatory Visit: Payer: Self-pay

## 2023-05-02 LAB — PROTEIN ELECTROPHORESIS, SERUM
A/G Ratio: 1.5 (ref 0.7–1.7)
Albumin ELP: 3.8 g/dL (ref 2.9–4.4)
Alpha-1-Globulin: 0.2 g/dL (ref 0.0–0.4)
Alpha-2-Globulin: 0.8 g/dL (ref 0.4–1.0)
Beta Globulin: 1 g/dL (ref 0.7–1.3)
Gamma Globulin: 0.5 g/dL (ref 0.4–1.8)
Globulin, Total: 2.6 g/dL (ref 2.2–3.9)
Total Protein ELP: 6.4 g/dL (ref 6.0–8.5)

## 2023-05-25 ENCOUNTER — Other Ambulatory Visit: Payer: Self-pay

## 2023-05-29 ENCOUNTER — Inpatient Hospital Stay: Payer: Medicare Other

## 2023-05-29 ENCOUNTER — Inpatient Hospital Stay: Payer: Medicare Other | Attending: Hematology

## 2023-05-29 VITALS — BP 122/77 | HR 97 | Temp 97.8°F | Resp 20 | Wt 185.7 lb

## 2023-05-29 DIAGNOSIS — C9000 Multiple myeloma not having achieved remission: Secondary | ICD-10-CM

## 2023-05-29 DIAGNOSIS — Z5112 Encounter for antineoplastic immunotherapy: Secondary | ICD-10-CM | POA: Diagnosis not present

## 2023-05-29 LAB — CBC WITH DIFFERENTIAL/PLATELET
Abs Immature Granulocytes: 0.04 10*3/uL (ref 0.00–0.07)
Basophils Absolute: 0.1 10*3/uL (ref 0.0–0.1)
Basophils Relative: 1 %
Eosinophils Absolute: 0.5 10*3/uL (ref 0.0–0.5)
Eosinophils Relative: 6 %
HCT: 40.8 % (ref 39.0–52.0)
Hemoglobin: 14.4 g/dL (ref 13.0–17.0)
Immature Granulocytes: 0 %
Lymphocytes Relative: 20 %
Lymphs Abs: 1.8 10*3/uL (ref 0.7–4.0)
MCH: 32.3 pg (ref 26.0–34.0)
MCHC: 35.3 g/dL (ref 30.0–36.0)
MCV: 91.5 fL (ref 80.0–100.0)
Monocytes Absolute: 0.7 10*3/uL (ref 0.1–1.0)
Monocytes Relative: 8 %
Neutro Abs: 5.8 10*3/uL (ref 1.7–7.7)
Neutrophils Relative %: 65 %
Platelets: 245 10*3/uL (ref 150–400)
RBC: 4.46 MIL/uL (ref 4.22–5.81)
RDW: 12.8 % (ref 11.5–15.5)
WBC: 9 10*3/uL (ref 4.0–10.5)
nRBC: 0 % (ref 0.0–0.2)

## 2023-05-29 LAB — CMP (CANCER CENTER ONLY)
ALT: 12 U/L (ref 0–44)
AST: 15 U/L (ref 15–41)
Albumin: 3.9 g/dL (ref 3.5–5.0)
Alkaline Phosphatase: 86 U/L (ref 38–126)
Anion gap: 12 (ref 5–15)
BUN: 18 mg/dL (ref 8–23)
CO2: 26 mmol/L (ref 22–32)
Calcium: 9.1 mg/dL (ref 8.9–10.3)
Chloride: 100 mmol/L (ref 98–111)
Creatinine: 1.27 mg/dL — ABNORMAL HIGH (ref 0.61–1.24)
GFR, Estimated: 58 mL/min — ABNORMAL LOW (ref >60)
Glucose, Bld: 213 mg/dL — ABNORMAL HIGH (ref 70–99)
Potassium: 3.4 mmol/L — ABNORMAL LOW (ref 3.5–5.1)
Sodium: 138 mmol/L (ref 135–145)
Total Bilirubin: 0.8 mg/dL (ref 0.0–1.2)
Total Protein: 6.4 g/dL — ABNORMAL LOW (ref 6.5–8.1)

## 2023-05-29 LAB — MAGNESIUM: Magnesium: 1.6 mg/dL — ABNORMAL LOW (ref 1.7–2.4)

## 2023-05-29 MED ORDER — DARATUMUMAB-HYALURONIDASE-FIHJ 1800-30000 MG-UT/15ML ~~LOC~~ SOLN
1800.0000 mg | Freq: Once | SUBCUTANEOUS | Status: AC
  Administered 2023-05-29: 1800 mg via SUBCUTANEOUS
  Filled 2023-05-29: qty 15

## 2023-05-29 MED ORDER — SODIUM CHLORIDE 0.9 % IV SOLN: Freq: Once | INTRAVENOUS | Status: AC

## 2023-05-29 MED ORDER — MAGNESIUM SULFATE 2 GM/50ML IV SOLN
2.0000 g | Freq: Once | INTRAVENOUS | Status: AC
  Administered 2023-05-29: 2 g via INTRAVENOUS
  Filled 2023-05-29: qty 50

## 2023-05-29 MED ORDER — ZOLEDRONIC ACID 4 MG/100ML IV SOLN
4.0000 mg | Freq: Once | INTRAVENOUS | Status: AC
  Administered 2023-05-29: 4 mg via INTRAVENOUS
  Filled 2023-05-29: qty 100

## 2023-05-29 NOTE — Patient Instructions (Signed)
 [facility] [place] - A DEPT OF [facility]  Discharge Instructions: Thank you for choosing [facility] to provide your oncology and hematology care.  If you have a lab appointment with the Cancer Center - please note that after April 8th, 2024, all labs will be drawn in the cancer center.  You do not have to check in or register with the main entrance as you have in the past but will complete your check-in in the cancer center.  Wear comfortable clothing and clothing appropriate for easy access to any Portacath or PICC line.   We strive to give you quality time with your provider. You may need to reschedule your appointment if you arrive late (15 or more minutes).  Arriving late affects you and other patients whose appointments are after yours.  Also, if you miss three or more appointments without notifying the office, you may be dismissed from the clinic at the provider's discretion.      For prescription refill requests, have your pharmacy contact our office and allow 72 hours for refills to be completed.    Today you received the following chemotherapy and/or immunotherapy agents Dara , Zometa, and 2g magnesium sulfate IV   To help prevent nausea and vomiting after your treatment, we encourage you to take your nausea medication as directed.  BELOW ARE SYMPTOMS THAT SHOULD BE REPORTED IMMEDIATELY: *FEVER GREATER THAN 100.4 F (38 C) OR HIGHER *CHILLS OR SWEATING *NAUSEA AND VOMITING THAT IS NOT CONTROLLED WITH YOUR NAUSEA MEDICATION *UNUSUAL SHORTNESS OF BREATH *UNUSUAL BRUISING OR BLEEDING *URINARY PROBLEMS (pain or burning when urinating, or frequent urination) *BOWEL PROBLEMS (unusual diarrhea, constipation, pain near the anus) TENDERNESS IN MOUTH AND THROAT WITH OR WITHOUT PRESENCE OF ULCERS (sore throat, sores in mouth, or a toothache) UNUSUAL RASH, SWELLING OR PAIN  UNUSUAL VAGINAL DISCHARGE OR ITCHING   Items with * indicate a potential  emergency and should be followed up as soon as possible or go to the Emergency Department if any problems should occur.  Please show the CHEMOTHERAPY ALERT CARD or IMMUNOTHERAPY ALERT CARD at check-in to the Emergency Department and triage nurse.  Should you have questions after your visit or need to cancel or reschedule your appointment, please contact [facility] [place] - A DEPT OF [facility] [phone]  and follow the prompts.  Office hours are 8:00 a.m. to 4:30 p.m. Monday - Friday. Please note that voicemails left after 4:00 p.m. may not be returned until the following business day.  We are closed weekends and major holidays. You have access to a nurse at all times for urgent questions. Please call the main number to the clinic [phone] and follow the prompts.  For any non-urgent questions, you may also contact your provider using MyChart. We now offer e-Visits for anyone 87 and older to request care online for non-urgent symptoms. For details visit [email or web address].   Also download the MyChart app! Go to the app store, search "MyChart", open the app, select [place], and log in with your MyChart username and password.

## 2023-05-29 NOTE — Progress Notes (Signed)
 Patient presents today for Darzalex Faspro infusion.  Patient is in satisfactory condition with no new complaints voiced.  Vital signs are stable.  Labs reviewed and all labs are within treatment parameters. Patient will receive 2g IV magnesium per Dr.Katragadda's standing orders. We will proceed with treatment per MD orders.    Patient took pre-meds at home prior to arrival. Peripheral IV started with good blood return pre and post infusion.  Treatment given today per MD orders. Tolerated infusion without adverse affects. Vital signs stable. No complaints at this time. Discharged from clinic ambulatory with cane in stable condition. Alert and oriented x 3. F/U with [facility] as scheduled.

## 2023-05-30 LAB — KAPPA/LAMBDA LIGHT CHAINS
Kappa free light chain: 10.3 mg/L (ref 3.3–19.4)
Kappa, lambda light chain ratio: 1.37 (ref 0.26–1.65)
Lambda free light chains: 7.5 mg/L (ref 5.7–26.3)

## 2023-06-03 LAB — PROTEIN ELECTROPHORESIS, SERUM
A/G Ratio: 1.8 — ABNORMAL HIGH (ref 0.7–1.7)
Albumin ELP: 3.7 g/dL (ref 2.9–4.4)
Alpha-1-Globulin: 0.2 g/dL (ref 0.0–0.4)
Alpha-2-Globulin: 0.7 g/dL (ref 0.4–1.0)
Beta Globulin: 0.8 g/dL (ref 0.7–1.3)
Gamma Globulin: 0.4 g/dL (ref 0.4–1.8)
Globulin, Total: 2.1 g/dL — ABNORMAL LOW (ref 2.2–3.9)
Total Protein ELP: 5.8 g/dL — ABNORMAL LOW (ref 6.0–8.5)

## 2023-06-26 ENCOUNTER — Inpatient Hospital Stay: Payer: Medicare Other

## 2023-06-26 ENCOUNTER — Inpatient Hospital Stay: Payer: Medicare Other | Attending: Hematology

## 2023-06-26 VITALS — BP 148/72 | HR 87 | Temp 96.7°F | Resp 18 | Wt 189.4 lb

## 2023-06-26 DIAGNOSIS — C9000 Multiple myeloma not having achieved remission: Secondary | ICD-10-CM | POA: Diagnosis not present

## 2023-06-26 DIAGNOSIS — Z79899 Other long term (current) drug therapy: Secondary | ICD-10-CM | POA: Diagnosis not present

## 2023-06-26 DIAGNOSIS — Z5112 Encounter for antineoplastic immunotherapy: Secondary | ICD-10-CM | POA: Insufficient documentation

## 2023-06-26 LAB — CBC WITH DIFFERENTIAL/PLATELET
Abs Immature Granulocytes: 0.04 10*3/uL (ref 0.00–0.07)
Basophils Absolute: 0.1 10*3/uL (ref 0.0–0.1)
Basophils Relative: 1 %
Eosinophils Absolute: 1.1 10*3/uL — ABNORMAL HIGH (ref 0.0–0.5)
Eosinophils Relative: 11 %
HCT: 41.3 % (ref 39.0–52.0)
Hemoglobin: 14.4 g/dL (ref 13.0–17.0)
Immature Granulocytes: 0 %
Lymphocytes Relative: 20 %
Lymphs Abs: 2 10*3/uL (ref 0.7–4.0)
MCH: 32.1 pg (ref 26.0–34.0)
MCHC: 34.9 g/dL (ref 30.0–36.0)
MCV: 92.2 fL (ref 80.0–100.0)
Monocytes Absolute: 0.9 10*3/uL (ref 0.1–1.0)
Monocytes Relative: 9 %
Neutro Abs: 6.2 10*3/uL (ref 1.7–7.7)
Neutrophils Relative %: 59 %
Platelets: 245 10*3/uL (ref 150–400)
RBC: 4.48 MIL/uL (ref 4.22–5.81)
RDW: 13.1 % (ref 11.5–15.5)
WBC: 10.4 10*3/uL (ref 4.0–10.5)
nRBC: 0 % (ref 0.0–0.2)

## 2023-06-26 LAB — CMP (CANCER CENTER ONLY)
ALT: 13 U/L (ref 0–44)
AST: 14 U/L — ABNORMAL LOW (ref 15–41)
Albumin: 4 g/dL (ref 3.5–5.0)
Alkaline Phosphatase: 79 U/L (ref 38–126)
Anion gap: 11 (ref 5–15)
BUN: 18 mg/dL (ref 8–23)
CO2: 29 mmol/L (ref 22–32)
Calcium: 9.7 mg/dL (ref 8.9–10.3)
Chloride: 99 mmol/L (ref 98–111)
Creatinine: 1.23 mg/dL (ref 0.61–1.24)
GFR, Estimated: >60 mL/min (ref >60)
Glucose, Bld: 185 mg/dL — ABNORMAL HIGH (ref 70–99)
Potassium: 3.5 mmol/L (ref 3.5–5.1)
Sodium: 139 mmol/L (ref 135–145)
Total Bilirubin: 0.6 mg/dL (ref 0.0–1.2)
Total Protein: 6.7 g/dL (ref 6.5–8.1)

## 2023-06-26 LAB — MAGNESIUM: Magnesium: 1.8 mg/dL (ref 1.7–2.4)

## 2023-06-26 MED ORDER — ZOLEDRONIC ACID 4 MG/100ML IV SOLN
4.0000 mg | Freq: Once | INTRAVENOUS | Status: AC
  Administered 2023-06-26: 4 mg via INTRAVENOUS
  Filled 2023-06-26: qty 100

## 2023-06-26 MED ORDER — DARATUMUMAB-HYALURONIDASE-FIHJ 1800-30000 MG-UT/15ML ~~LOC~~ SOLN
1800.0000 mg | Freq: Once | SUBCUTANEOUS | Status: AC
  Administered 2023-06-26: 1800 mg via SUBCUTANEOUS
  Filled 2023-06-26: qty 15

## 2023-06-26 MED ORDER — SODIUM CHLORIDE 0.9 % IV SOLN: Freq: Once | INTRAVENOUS | Status: AC

## 2023-06-26 NOTE — Patient Instructions (Signed)
 [facility] [place] - A DEPT OF [facility]  Discharge Instructions: Thank you for choosing [facility] to provide your oncology and hematology care.  If you have a lab appointment with the Cancer Center - please note that after April 8th, 2024, all labs will be drawn in the cancer center.  You do not have to check in or register with the main entrance as you have in the past but will complete your check-in in the cancer center.  Wear comfortable clothing and clothing appropriate for easy access to any Portacath or PICC line.   We strive to give you quality time with your provider. You may need to reschedule your appointment if you arrive late (15 or more minutes).  Arriving late affects you and other patients whose appointments are after yours.  Also, if you miss three or more appointments without notifying the office, you may be dismissed from the clinic at the provider's discretion.      For prescription refill requests, have your pharmacy contact our office and allow 72 hours for refills to be completed.    Today you received the following chemotherapy and/or immunotherapy agents Daratumumab and Zometa, return as scheduled.   To help prevent nausea and vomiting after your treatment, we encourage you to take your nausea medication as directed.  BELOW ARE SYMPTOMS THAT SHOULD BE REPORTED IMMEDIATELY: *FEVER GREATER THAN 100.4 F (38 C) OR HIGHER *CHILLS OR SWEATING *NAUSEA AND VOMITING THAT IS NOT CONTROLLED WITH YOUR NAUSEA MEDICATION *UNUSUAL SHORTNESS OF BREATH *UNUSUAL BRUISING OR BLEEDING *URINARY PROBLEMS (pain or burning when urinating, or frequent urination) *BOWEL PROBLEMS (unusual diarrhea, constipation, pain near the anus) TENDERNESS IN MOUTH AND THROAT WITH OR WITHOUT PRESENCE OF ULCERS (sore throat, sores in mouth, or a toothache) UNUSUAL RASH, SWELLING OR PAIN  UNUSUAL VAGINAL DISCHARGE OR ITCHING   Items with * indicate a potential  emergency and should be followed up as soon as possible or go to the Emergency Department if any problems should occur.  Please show the CHEMOTHERAPY ALERT CARD or IMMUNOTHERAPY ALERT CARD at check-in to the Emergency Department and triage nurse.  Should you have questions after your visit or need to cancel or reschedule your appointment, please contact [facility] [place] - A DEPT OF [facility] [phone]  and follow the prompts.  Office hours are 8:00 a.m. to 4:30 p.m. Monday - Friday. Please note that voicemails left after 4:00 p.m. may not be returned until the following business day.  We are closed weekends and major holidays. You have access to a nurse at all times for urgent questions. Please call the main number to the clinic [phone] and follow the prompts.  For any non-urgent questions, you may also contact your provider using MyChart. We now offer e-Visits for anyone 28 and older to request care online for non-urgent symptoms. For details visit [email or web address].   Also download the MyChart app! Go to the app store, search "MyChart", open the app, select [place], and log in with your MyChart username and password.

## 2023-06-26 NOTE — Progress Notes (Signed)
 Patient reports taking pre-meds at home. Patient tolerated therapy with no complaints voiced. Labs reviewed. Side effects with management reviewed with understanding verbalized. Peripheral IV site clean and dry with no bruising or swelling noted at site. Good blood return noted before and after administration of therapy. Band aid applied.  Patient tolerated Daratumumab injection with no complaints voiced. See MAR for details. Lab reviewed. Injection site clean and dry with no bruising or swelling noted at site. Band aid applied. Vss with discharge and left in satisfactory condition with nos/s of distress noted.

## 2023-06-27 LAB — KAPPA/LAMBDA LIGHT CHAINS
Kappa free light chain: 11.5 mg/L (ref 3.3–19.4)
Kappa, lambda light chain ratio: 1.98 — ABNORMAL HIGH (ref 0.26–1.65)
Lambda free light chains: 5.8 mg/L (ref 5.7–26.3)

## 2023-07-01 LAB — PROTEIN ELECTROPHORESIS, SERUM
A/G Ratio: 1.6 (ref 0.7–1.7)
Albumin ELP: 3.7 g/dL (ref 2.9–4.4)
Alpha-1-Globulin: 0.2 g/dL (ref 0.0–0.4)
Alpha-2-Globulin: 0.8 g/dL (ref 0.4–1.0)
Beta Globulin: 1 g/dL (ref 0.7–1.3)
Gamma Globulin: 0.4 g/dL (ref 0.4–1.8)
Globulin, Total: 2.3 g/dL (ref 2.2–3.9)
Total Protein ELP: 6 g/dL (ref 6.0–8.5)

## 2023-07-09 ENCOUNTER — Other Ambulatory Visit: Payer: Self-pay

## 2023-07-17 ENCOUNTER — Inpatient Hospital Stay: Payer: Medicare Other | Attending: Hematology

## 2023-07-17 DIAGNOSIS — C9000 Multiple myeloma not having achieved remission: Secondary | ICD-10-CM | POA: Diagnosis not present

## 2023-07-17 LAB — CBC WITH DIFFERENTIAL/PLATELET
Abs Immature Granulocytes: 0.03 10*3/uL (ref 0.00–0.07)
Basophils Absolute: 0.1 10*3/uL (ref 0.0–0.1)
Basophils Relative: 1 %
Eosinophils Absolute: 0.9 10*3/uL — ABNORMAL HIGH (ref 0.0–0.5)
Eosinophils Relative: 9 %
HCT: 40.4 % (ref 39.0–52.0)
Hemoglobin: 14.1 g/dL (ref 13.0–17.0)
Immature Granulocytes: 0 %
Lymphocytes Relative: 22 %
Lymphs Abs: 2 10*3/uL (ref 0.7–4.0)
MCH: 32.6 pg (ref 26.0–34.0)
MCHC: 34.9 g/dL (ref 30.0–36.0)
MCV: 93.5 fL (ref 80.0–100.0)
Monocytes Absolute: 0.9 10*3/uL (ref 0.1–1.0)
Monocytes Relative: 9 %
Neutro Abs: 5.5 10*3/uL (ref 1.7–7.7)
Neutrophils Relative %: 59 %
Platelets: 244 10*3/uL (ref 150–400)
RBC: 4.32 MIL/uL (ref 4.22–5.81)
RDW: 12.8 % (ref 11.5–15.5)
WBC: 9.3 10*3/uL (ref 4.0–10.5)
nRBC: 0 % (ref 0.0–0.2)

## 2023-07-17 LAB — COMPREHENSIVE METABOLIC PANEL WITH GFR
ALT: 13 U/L (ref 0–44)
AST: 18 U/L (ref 15–41)
Albumin: 3.9 g/dL (ref 3.5–5.0)
Alkaline Phosphatase: 87 U/L (ref 38–126)
Anion gap: 15 (ref 5–15)
BUN: 22 mg/dL (ref 8–23)
CO2: 27 mmol/L (ref 22–32)
Calcium: 9.6 mg/dL (ref 8.9–10.3)
Chloride: 98 mmol/L (ref 98–111)
Creatinine, Ser: 1.37 mg/dL — ABNORMAL HIGH (ref 0.61–1.24)
GFR, Estimated: 53 mL/min — ABNORMAL LOW (ref >60)
Glucose, Bld: 165 mg/dL — ABNORMAL HIGH (ref 70–99)
Potassium: 3.3 mmol/L — ABNORMAL LOW (ref 3.5–5.1)
Sodium: 140 mmol/L (ref 135–145)
Total Bilirubin: 0.5 mg/dL (ref 0.0–1.2)
Total Protein: 6.5 g/dL (ref 6.5–8.1)

## 2023-07-17 LAB — MAGNESIUM: Magnesium: 1.9 mg/dL (ref 1.7–2.4)

## 2023-07-19 LAB — PROTEIN ELECTROPHORESIS, SERUM
A/G Ratio: 1.6 (ref 0.7–1.7)
Albumin ELP: 3.6 g/dL (ref 2.9–4.4)
Alpha-1-Globulin: 0.2 g/dL (ref 0.0–0.4)
Alpha-2-Globulin: 0.8 g/dL (ref 0.4–1.0)
Beta Globulin: 0.9 g/dL (ref 0.7–1.3)
Gamma Globulin: 0.4 g/dL (ref 0.4–1.8)
Globulin, Total: 2.3 g/dL (ref 2.2–3.9)
Total Protein ELP: 5.9 g/dL — ABNORMAL LOW (ref 6.0–8.5)

## 2023-07-22 DIAGNOSIS — R509 Fever, unspecified: Secondary | ICD-10-CM | POA: Diagnosis not present

## 2023-07-22 DIAGNOSIS — R051 Acute cough: Secondary | ICD-10-CM | POA: Diagnosis not present

## 2023-07-22 DIAGNOSIS — Z20822 Contact with and (suspected) exposure to covid-19: Secondary | ICD-10-CM | POA: Diagnosis not present

## 2023-07-22 DIAGNOSIS — J3489 Other specified disorders of nose and nasal sinuses: Secondary | ICD-10-CM | POA: Diagnosis not present

## 2023-07-22 DIAGNOSIS — U071 COVID-19: Secondary | ICD-10-CM | POA: Diagnosis not present

## 2023-07-22 LAB — KAPPA/LAMBDA LIGHT CHAINS
Kappa free light chain: 11.1 mg/L (ref 3.3–19.4)
Kappa, lambda light chain ratio: 1.54 (ref 0.26–1.65)
Lambda free light chains: 7.2 mg/L (ref 5.7–26.3)

## 2023-07-23 ENCOUNTER — Telehealth: Payer: Self-pay | Admitting: *Deleted

## 2023-07-23 NOTE — Telephone Encounter (Signed)
 Patient called to advise that he started feeling bad on Friday.  Tested positive for COVID yesterday and cancelled appts for today.  [facility], [facility] and will get today.  Will reschedule appointments per Dr. Milburn Aliment recommendation.

## 2023-07-24 ENCOUNTER — Inpatient Hospital Stay: Payer: Medicare Other

## 2023-07-24 ENCOUNTER — Inpatient Hospital Stay: Payer: Medicare Other | Admitting: Hematology

## 2023-07-24 ENCOUNTER — Other Ambulatory Visit: Payer: Self-pay

## 2023-07-26 LAB — IFE, DARA-SPECIFIC, SERUM
IgA: 27 mg/dL — ABNORMAL LOW (ref 61–437)
IgG (Immunoglobin G), Serum: 494 mg/dL — ABNORMAL LOW (ref 603–1613)
IgM (Immunoglobulin M), Srm: 38 mg/dL (ref 15–143)
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
## 2023-08-07 ENCOUNTER — Inpatient Hospital Stay: Attending: Hematology | Admitting: Hematology

## 2023-08-07 ENCOUNTER — Inpatient Hospital Stay

## 2023-08-07 VITALS — BP 147/77 | HR 94 | Temp 97.3°F | Resp 19 | Ht 68.0 in | Wt 187.0 lb

## 2023-08-07 DIAGNOSIS — C9000 Multiple myeloma not having achieved remission: Secondary | ICD-10-CM

## 2023-08-07 DIAGNOSIS — Z79899 Other long term (current) drug therapy: Secondary | ICD-10-CM | POA: Insufficient documentation

## 2023-08-07 DIAGNOSIS — Z5112 Encounter for antineoplastic immunotherapy: Secondary | ICD-10-CM | POA: Diagnosis not present

## 2023-08-07 LAB — CBC WITH DIFFERENTIAL/PLATELET
Abs Immature Granulocytes: 0.05 10*3/uL (ref 0.00–0.07)
Basophils Absolute: 0.1 10*3/uL (ref 0.0–0.1)
Basophils Relative: 1 %
Eosinophils Absolute: 1.2 10*3/uL — ABNORMAL HIGH (ref 0.0–0.5)
Eosinophils Relative: 11 %
HCT: 39.2 % (ref 39.0–52.0)
Hemoglobin: 14.2 g/dL (ref 13.0–17.0)
Immature Granulocytes: 0 %
Lymphocytes Relative: 19 %
Lymphs Abs: 2.3 10*3/uL (ref 0.7–4.0)
MCH: 33.5 pg (ref 26.0–34.0)
MCHC: 36.2 g/dL — ABNORMAL HIGH (ref 30.0–36.0)
MCV: 92.5 fL (ref 80.0–100.0)
Monocytes Absolute: 0.9 10*3/uL (ref 0.1–1.0)
Monocytes Relative: 8 %
Neutro Abs: 7.2 10*3/uL (ref 1.7–7.7)
Neutrophils Relative %: 61 %
Platelets: 299 10*3/uL (ref 150–400)
RBC: 4.24 MIL/uL (ref 4.22–5.81)
RDW: 12.9 % (ref 11.5–15.5)
WBC: 11.7 10*3/uL — ABNORMAL HIGH (ref 4.0–10.5)
nRBC: 0 % (ref 0.0–0.2)

## 2023-08-07 LAB — CMP (CANCER CENTER ONLY)
ALT: 10 U/L (ref 0–44)
AST: 14 U/L — ABNORMAL LOW (ref 15–41)
Albumin: 3.8 g/dL (ref 3.5–5.0)
Alkaline Phosphatase: 79 U/L (ref 38–126)
Anion gap: 13 (ref 5–15)
BUN: 18 mg/dL (ref 8–23)
CO2: 28 mmol/L (ref 22–32)
Calcium: 9.8 mg/dL (ref 8.9–10.3)
Chloride: 97 mmol/L — ABNORMAL LOW (ref 98–111)
Creatinine: 1.36 mg/dL — ABNORMAL HIGH (ref 0.61–1.24)
GFR, Estimated: 53 mL/min — ABNORMAL LOW (ref >60)
Glucose, Bld: 204 mg/dL — ABNORMAL HIGH (ref 70–99)
Potassium: 3.5 mmol/L (ref 3.5–5.1)
Sodium: 138 mmol/L (ref 135–145)
Total Bilirubin: 0.9 mg/dL (ref 0.0–1.2)
Total Protein: 6.4 g/dL — ABNORMAL LOW (ref 6.5–8.1)

## 2023-08-07 LAB — MAGNESIUM: Magnesium: 1.5 mg/dL — ABNORMAL LOW (ref 1.7–2.4)

## 2023-08-07 MED ORDER — SODIUM CHLORIDE 0.9 % IV SOLN: Freq: Once | INTRAVENOUS | Status: AC

## 2023-08-07 MED ORDER — DARATUMUMAB-HYALURONIDASE-FIHJ 1800-30000 MG-UT/15ML ~~LOC~~ SOLN
1800.0000 mg | Freq: Once | SUBCUTANEOUS | Status: AC
  Administered 2023-08-07: 1800 mg via SUBCUTANEOUS
  Filled 2023-08-07: qty 15

## 2023-08-07 MED ORDER — ZOLEDRONIC ACID 4 MG/100ML IV SOLN
4.0000 mg | Freq: Once | INTRAVENOUS | Status: AC
  Administered 2023-08-07: 4 mg via INTRAVENOUS
  Filled 2023-08-07: qty 100

## 2023-08-07 NOTE — Progress Notes (Signed)
 Patient has been examined by Dr. Ellin Saba. Vital signs and labs have been reviewed by MD - ANC, Creatinine, LFTs, hemoglobin, and platelets are within treatment parameters per M.D. - pt may proceed with treatment.  Primary RN and pharmacy notified.

## 2023-08-07 NOTE — Patient Instructions (Addendum)
 [facility] Discharge Instructions   You were seen and examined today by Dr. Cheree Cords.  He reviewed the results of your lab work which are mostly normal/stable. Increase your magnesium  at home to 3 times a day.   We will proceed with your treatment today.   Return as scheduled.    Thank you for choosing [facility] to provide your oncology and hematology care.  To afford each patient quality time with our provider, please arrive at least 15 minutes before your scheduled appointment time.   If you have a lab appointment with the Cancer Center please come in thru the Main Entrance and check in at the main information desk.  You need to re-schedule your appointment should you arrive 10 or more minutes late.  We strive to give you quality time with our providers, and arriving late affects you and other patients whose appointments are after yours.  Also, if you no show three or more times for appointments you may be dismissed from the clinic at the providers discretion.     Again, thank you for choosing [facility].  Our hope is that these requests will decrease the amount of time that you wait before being seen by our physicians.       _____________________________________________________________  Should you have questions after your visit to [facility], please contact our office at [phone] and follow the prompts.  Our office hours are 8:00 a.m. and 4:30 p.m. Monday - Friday.  Please note that voicemails left after 4:00 p.m. may not be returned until the following business day.  We are closed weekends and major holidays.  You do have access to a nurse 24-7, just call the main number to the clinic [phone] and do not press any options, hold on the line and a nurse will answer the phone.    For prescription refill requests, have your pharmacy contact our office and allow 72 hours.     Due to Covid, you will need to wear a mask upon entering the hospital. If you do not have a mask, a mask will be given to you at the Main Entrance upon arrival. For doctor visits, patients may have 1 support person age 15 or older with them. For treatment visits, patients can not have anyone with them due to social distancing guidelines and our immunocompromised population.

## 2023-08-07 NOTE — Patient Instructions (Signed)
 [facility] [place] - A DEPT OF [facility]  Discharge Instructions: Thank you for choosing [facility] to provide your oncology and hematology care.  If you have a lab appointment with the Cancer Center - please note that after April 8th, 2024, all labs will be drawn in the cancer center.  You do not have to check in or register with the main entrance as you have in the past but will complete your check-in in the cancer center.  Wear comfortable clothing and clothing appropriate for easy access to any Portacath or PICC line.   We strive to give you quality time with your provider. You may need to reschedule your appointment if you arrive late (15 or more minutesDaratumumab; Hyaluronidase  Injection What is this medication? DARATUMUMAB ; HYALURONIDASE  (dar a toom ue mab; hye al ur ON i dase) treats multiple myeloma, a type of bone marrow cancer. Daratumumab  works by blocking a protein that causes cancer cells to grow and multiply. This helps to slow or stop the spread of cancer cells. Hyaluronidase  works by increasing the absorption of other medications in the body to help them work better. This medication may also be used treat amyloidosis, a condition that causes the buildup of a protein (amyloid) in your body. It works by reducing the buildup of this protein, which decreases symptoms. It is a combination medication that contains a monoclonal antibody. This medicine may be used for other purposes; ask your health care provider or pharmacist if you have questions. COMMON BRAND NAME(S): DARZALEX  FASPRO What should I tell my care team before I take this medication? They need to know if you have any of these conditions: Heart disease Infection, such as chickenpox, cold sores, herpes, hepatitis B Lung or breathing disease An unusual or allergic reaction to daratumumab , hyaluronidase , other medications, foods, dyes, or preservatives Pregnant or trying to get  pregnant Breast-feeding How should I use this medication? This medication is injected under the skin. It is given by your care team in a hospital or clinic setting. Talk to your care team about the use of this medication in children. Special care may be needed. Overdosage: If you think you have taken too much of this medicine contact a poison control center or emergency room at once. NOTE: This medicine is only for you. Do not share this medicine with others. What if I miss a dose? Keep appointments for follow-up doses. It is important not to miss your dose. Call your care team if you are unable to keep an appointment. What may interact with this medication? Interactions have not been studied. This list may not describe all possible interactions. Give your health care provider a list of all the medicines, herbs, non-prescription drugs, or dietary supplements you use. Also tell them if you smoke, drink alcohol, or use illegal drugs. Some items may interact with your medicine. What should I watch for while using this medication? Your condition will be monitored carefully while you are receiving this medication. This medication can cause serious allergic reactions. To reduce your risk, your care team may give you other medication to take before receiving this one. Be sure to follow the directions from your care team. This medication can affect the results of blood tests to match your blood type. These changes can last for up to 6 months after the final dose. Your care team will do blood tests to match your blood type before you start treatment. Tell all of your  care team that you are being treated with this medication before receiving a blood transfusion. This medication can affect the results of some tests used to determine treatment response; extra tests may be needed to evaluate response. Talk to your care team if you wish to become pregnant or think you are pregnant. This medication can cause  serious birth defects if taken during pregnancy and for 3 months after the last dose. A reliable form of contraception is recommended while taking this medication and for 3 months after the last dose. Talk to your care team about effective forms of contraception. Do not breast-feed while taking this medication. What side effects may I notice from receiving this medication? Side effects that you should report to your care team as soon as possible: Allergic reactions--skin rash, itching, hives, swelling of the face, lips, tongue, or throat Heart rhythm changes--fast or irregular heartbeat, dizziness, feeling faint or lightheaded, chest pain, trouble breathing Infection--fever, chills, cough, sore throat, wounds that don't heal, pain or trouble when passing urine, general feeling of discomfort or being unwell Infusion reactions--chest pain, shortness of breath or trouble breathing, feeling faint or lightheaded Sudden eye pain or change in vision such as blurry vision, seeing halos around lights, vision loss Unusual bruising or bleeding Side effects that usually do not require medical attention (report to your care team if they continue or are bothersome): Constipation Diarrhea Fatigue Nausea Pain, tingling, or numbness in the hands or feet Swelling of the ankles, hands, or feet This list may not describe all possible side effects. Call your doctor for medical advice about side effects. You may report side effects to FDA at 1-800-FDA-1088. Where should I keep my medication? This medication is given in a hospital or clinic. It will not be stored at home. NOTE: This sheet is a summary. It may not cover all possible information. If you have questions about this medicine, talk to your doctor, pharmacist, or health care provider.  2024 Elsevier/Gold Standard (2021-07-25 00:00:00)).  Arriving late affects you and other patients whose appointments are after yours.  Also, if you miss three or more  appointments without notifying the office, you may be dismissed from the clinic at the provider's discretion.      For prescription refill requests, have your pharmacy contact our office and allow 72 hours for refills to be completed.    Today you received the following chemotherapy and/or immunotherapy agents Dara Tiburones and Zometa  4mg  IV       To help prevent nausea and vomiting after your treatment, we encourage you to take your nausea medication as directed.    BELOW ARE SYMPTOMS THAT SHOULD BE REPORTED IMMEDIATELY: *FEVER GREATER THAN 100.4 F (38 C) OR HIGHER *CHILLS OR SWEATING *NAUSEA AND VOMITING THAT IS NOT CONTROLLED WITH YOUR NAUSEA MEDICATION *UNUSUAL SHORTNESS OF BREATH *UNUSUAL BRUISING OR BLEEDING *URINARY PROBLEMS (pain or burning when urinating, or frequent urination) *BOWEL PROBLEMS (unusual diarrhea, constipation, pain near the anus) TENDERNESS IN MOUTH AND THROAT WITH OR WITHOUT PRESENCE OF ULCERS (sore throat, sores in mouth, or a toothache) UNUSUAL RASH, SWELLING OR PAIN  UNUSUAL VAGINAL DISCHARGE OR ITCHING   Items with * indicate a potential emergency and should be followed up as soon as possible or go to the Emergency Department if any problems should occur.  Please show the CHEMOTHERAPY ALERT CARD or IMMUNOTHERAPY ALERT CARD at check-in to the Emergency Department and triage nurse.  Should you have questions after your visit or need to cancel or reschedule  your appointment, please contact [facility] [place] - A DEPT OF [facility] [phone]  and follow the prompts.  Office hours are 8:00 a.m. to 4:30 p.m. Monday - Friday. Please note that voicemails left after 4:00 p.m. may not be returned until the following business day.  We are closed weekends and major holidays. You have access to a nurse at all times for urgent questions. Please call the main number to the clinic [phone] and follow the prompts.  For any non-urgent questions,  you may also contact your provider using MyChart. We now offer e-Visits for anyone 66 and older to request care online for non-urgent symptoms. For details visit [email or web address].   Also download the MyChart app! Go to the app store, search "MyChart", open the app, select [place], and log in with your MyChart username and password.

## 2023-08-07 NOTE — Progress Notes (Signed)
 Patient presents today for Darzalex  Faspro and Zometa  4mg  IV infusion. Patient is in satisfactory condition with no new complaints voiced.  Vital signs are stable. No additional magnesium  needed today per Dr.Katragadda.  Labs reviewed by Dr. Cheree Cords during the office visit and all labs are within treatment parameters.  We will proceed with treatment per MD orders.   Patient took pre-medication at home prior to arrival.  Treatment given today per MD orders. Tolerated infusion without adverse affects. Vital signs stable. No complaints at this time. Discharged from clinic ambulatory with cane  in stable condition. Alert and oriented x 3. F/U with [facility] as scheduled.

## 2023-08-08 ENCOUNTER — Other Ambulatory Visit: Payer: Self-pay

## 2023-08-08 LAB — KAPPA/LAMBDA LIGHT CHAINS
Kappa free light chain: 12.9 mg/L (ref 3.3–19.4)
Kappa, lambda light chain ratio: 1.74 — ABNORMAL HIGH (ref 0.26–1.65)
Lambda free light chains: 7.4 mg/L (ref 5.7–26.3)

## 2023-08-09 DIAGNOSIS — M810 Age-related osteoporosis without current pathological fracture: Secondary | ICD-10-CM | POA: Diagnosis not present

## 2023-08-09 DIAGNOSIS — E782 Mixed hyperlipidemia: Secondary | ICD-10-CM | POA: Diagnosis not present

## 2023-08-09 DIAGNOSIS — E1122 Type 2 diabetes mellitus with diabetic chronic kidney disease: Secondary | ICD-10-CM | POA: Diagnosis not present

## 2023-08-09 DIAGNOSIS — E538 Deficiency of other specified B group vitamins: Secondary | ICD-10-CM | POA: Diagnosis not present

## 2023-08-12 DIAGNOSIS — M79672 Pain in left foot: Secondary | ICD-10-CM | POA: Diagnosis not present

## 2023-08-12 DIAGNOSIS — S92422A Displaced fracture of distal phalanx of left great toe, initial encounter for closed fracture: Secondary | ICD-10-CM | POA: Diagnosis not present

## 2023-08-13 LAB — PROTEIN ELECTROPHORESIS, SERUM
A/G Ratio: 1.4 (ref 0.7–1.7)
Albumin ELP: 3.3 g/dL (ref 2.9–4.4)
Alpha-1-Globulin: 0.2 g/dL (ref 0.0–0.4)
Alpha-2-Globulin: 0.8 g/dL (ref 0.4–1.0)
Beta Globulin: 0.9 g/dL (ref 0.7–1.3)
Gamma Globulin: 0.5 g/dL (ref 0.4–1.8)
Globulin, Total: 2.4 g/dL (ref 2.2–3.9)
Total Protein ELP: 5.7 g/dL — ABNORMAL LOW (ref 6.0–8.5)

## 2023-08-15 DIAGNOSIS — E782 Mixed hyperlipidemia: Secondary | ICD-10-CM | POA: Diagnosis not present

## 2023-08-15 DIAGNOSIS — E1140 Type 2 diabetes mellitus with diabetic neuropathy, unspecified: Secondary | ICD-10-CM | POA: Diagnosis not present

## 2023-08-15 DIAGNOSIS — C9000 Multiple myeloma not having achieved remission: Secondary | ICD-10-CM | POA: Diagnosis not present

## 2023-08-15 DIAGNOSIS — E1122 Type 2 diabetes mellitus with diabetic chronic kidney disease: Secondary | ICD-10-CM | POA: Diagnosis not present

## 2023-08-15 DIAGNOSIS — N1831 Chronic kidney disease, stage 3a: Secondary | ICD-10-CM | POA: Diagnosis not present

## 2023-08-21 ENCOUNTER — Inpatient Hospital Stay

## 2023-08-23 DIAGNOSIS — S92302D Fracture of unspecified metatarsal bone(s), left foot, subsequent encounter for fracture with routine healing: Secondary | ICD-10-CM | POA: Diagnosis not present

## 2023-09-04 ENCOUNTER — Inpatient Hospital Stay

## 2023-09-04 ENCOUNTER — Inpatient Hospital Stay: Attending: Hematology

## 2023-09-04 VITALS — BP 113/67 | HR 67 | Temp 97.5°F | Resp 18 | Wt 188.8 lb

## 2023-09-04 DIAGNOSIS — C9000 Multiple myeloma not having achieved remission: Secondary | ICD-10-CM | POA: Insufficient documentation

## 2023-09-04 DIAGNOSIS — Z5112 Encounter for antineoplastic immunotherapy: Secondary | ICD-10-CM | POA: Insufficient documentation

## 2023-09-04 LAB — CBC WITH DIFFERENTIAL/PLATELET
Abs Immature Granulocytes: 0 10*3/uL (ref 0.00–0.07)
Basophils Absolute: 0 10*3/uL (ref 0.0–0.1)
Basophils Relative: 0 %
Eosinophils Absolute: 3.8 10*3/uL — ABNORMAL HIGH (ref 0.0–0.5)
Eosinophils Relative: 34 %
HCT: 39.3 % (ref 39.0–52.0)
Hemoglobin: 14 g/dL (ref 13.0–17.0)
Lymphocytes Relative: 19 %
Lymphs Abs: 2.1 10*3/uL (ref 0.7–4.0)
MCH: 33.7 pg (ref 26.0–34.0)
MCHC: 35.6 g/dL (ref 30.0–36.0)
MCV: 94.5 fL (ref 80.0–100.0)
Monocytes Absolute: 0.5 10*3/uL (ref 0.1–1.0)
Monocytes Relative: 4 %
Neutro Abs: 4.9 10*3/uL (ref 1.7–7.7)
Neutrophils Relative %: 43 %
Platelets: 244 10*3/uL (ref 150–400)
RBC: 4.16 MIL/uL — ABNORMAL LOW (ref 4.22–5.81)
RDW: 13.4 % (ref 11.5–15.5)
WBC: 11.3 10*3/uL — ABNORMAL HIGH (ref 4.0–10.5)
nRBC: 0 % (ref 0.0–0.2)

## 2023-09-04 LAB — CMP (CANCER CENTER ONLY)
ALT: 13 U/L (ref 0–44)
AST: 14 U/L — ABNORMAL LOW (ref 15–41)
Albumin: 4 g/dL (ref 3.5–5.0)
Alkaline Phosphatase: 76 U/L (ref 38–126)
Anion gap: 10 (ref 5–15)
BUN: 19 mg/dL (ref 8–23)
CO2: 28 mmol/L (ref 22–32)
Calcium: 9.5 mg/dL (ref 8.9–10.3)
Chloride: 99 mmol/L (ref 98–111)
Creatinine: 1.38 mg/dL — ABNORMAL HIGH (ref 0.61–1.24)
GFR, Estimated: 52 mL/min — ABNORMAL LOW (ref >60)
Glucose, Bld: 212 mg/dL — ABNORMAL HIGH (ref 70–99)
Potassium: 3.2 mmol/L — ABNORMAL LOW (ref 3.5–5.1)
Sodium: 137 mmol/L (ref 135–145)
Total Bilirubin: 0.8 mg/dL (ref 0.0–1.2)
Total Protein: 6.6 g/dL (ref 6.5–8.1)

## 2023-09-04 LAB — PATHOLOGIST SMEAR REVIEW

## 2023-09-04 LAB — MAGNESIUM: Magnesium: 1.8 mg/dL (ref 1.7–2.4)

## 2023-09-04 MED ORDER — CETIRIZINE HCL 10 MG PO TABS
10.0000 mg | ORAL_TABLET | Freq: Once | ORAL | Status: DC
Start: 2023-09-04 — End: 2023-09-04

## 2023-09-04 MED ORDER — ACETAMINOPHEN 325 MG PO TABS
650.0000 mg | ORAL_TABLET | Freq: Once | ORAL | Status: DC
Start: 2023-09-04 — End: 2023-09-04

## 2023-09-04 MED ORDER — SODIUM CHLORIDE 0.9 % IV SOLN: Freq: Once | INTRAVENOUS | Status: AC

## 2023-09-04 MED ORDER — ZOLEDRONIC ACID 4 MG/100ML IV SOLN
4.0000 mg | Freq: Once | INTRAVENOUS | Status: AC
  Administered 2023-09-04: 4 mg via INTRAVENOUS
  Filled 2023-09-04: qty 100

## 2023-09-04 MED ORDER — DARATUMUMAB-HYALURONIDASE-FIHJ 1800-30000 MG-UT/15ML ~~LOC~~ SOLN
1800.0000 mg | Freq: Once | SUBCUTANEOUS | Status: AC
  Administered 2023-09-04: 1800 mg via SUBCUTANEOUS
  Filled 2023-09-04: qty 15

## 2023-09-04 NOTE — Progress Notes (Signed)
 Patient's CBG 212 and K 3.2. pt refuses to take any insulin or potassium at this time.   Patient tolerated Zometa   therapy with no complaints voiced. Labs reviewed. Side effects with management reviewed with understanding verbalized. Peripheral IV site clean and dry with no bruising or swelling noted at site. Good blood return noted before and after administration of therapy. Band aid applied. Patient left in satisfactory condition with VSS and no s/s of distress noted.   Patient tolerated Daratumumab  injection with no complaints voiced.  See MAR for details.  Labs reviewed. Injection site clean and dry with no bruising or swelling noted at site.  Band aid applied.  Vss with discharge and left in satisfactory condition with no s/s of distress noted.   [name] [name]

## 2023-09-04 NOTE — Patient Instructions (Signed)
 [facility] [place] - [facility]  Discharge Instructions: Thank you for choosing [facility] to provide your oncology and hematology care.  If you have a lab appointment with the Cancer Center - please note that after April 8th, 2024, all labs will be drawn in the cancer center.  You do not have to check in or register with the main entrance as you have in the past but will complete your check-in in the cancer center.  Wear comfortable clothing and clothing appropriate for easy access to any Portacath or PICC line.   We strive to give you quality time with your provider. You may need to reschedule your appointment if you arrive late (15 or more minutes).  Arriving late affects you and other patients whose appointments are after yours.  Also, if you miss three or more appointments without notifying the office, you may be dismissed from the clinic at the provider's discretion.      For prescription refill requests, have your pharmacy contact our office and allow 72 hours for refills to be completed.    Today you received the following chemotherapy and/or immunotherapy agents darzalex    To help prevent nausea and vomiting after your treatment, we encourage you to take your nausea medication as directed.  BELOW ARE SYMPTOMS THAT SHOULD BE REPORTED IMMEDIATELY: *FEVER GREATER THAN 100.4 F (38 C) OR HIGHER *CHILLS OR SWEATING *NAUSEA AND VOMITING THAT IS NOT CONTROLLED WITH YOUR NAUSEA MEDICATION *UNUSUAL SHORTNESS OF BREATH *UNUSUAL BRUISING OR BLEEDING *URINARY PROBLEMS (pain or burning when urinating, or frequent urination) *BOWEL PROBLEMS (unusual diarrhea, constipation, pain near the anus) TENDERNESS IN MOUTH AND THROAT WITH OR WITHOUT PRESENCE OF ULCERS (sore throat, sores in mouth, or a toothache) UNUSUAL RASH, SWELLING OR PAIN  UNUSUAL VAGINAL DISCHARGE OR ITCHING   Items with * indicate a potential emergency and should be followed up as  soon as possible or go to the Emergency Department if any problems should occur.  Please show the CHEMOTHERAPY ALERT CARD or IMMUNOTHERAPY ALERT CARD at check-in to the Emergency Department and triage nurse.  Should you have questions after your visit or need to cancel or reschedule your appointment, please contact [facility] [place] - A DEPT OF [facility] [phone]  and follow the prompts.  Office hours are 8:00 a.m. to 4:30 p.m. Monday - Friday. Please note that voicemails left after 4:00 p.m. may not be returned until the following business day.  We are closed weekends and major holidays. You have access to a nurse at all times for urgent questions. Please call the main number to the clinic [phone] and follow the prompts.  For any non-urgent questions, you may also contact your provider using MyChart. We now offer e-Visits for anyone 2 and older to request care online for non-urgent symptoms. For details visit [email or web address].   Also download the MyChart app! Go to the app store, search "MyChart", open the app, select [place], and log in with your MyChart username and password.

## 2023-09-05 ENCOUNTER — Other Ambulatory Visit: Payer: Self-pay | Admitting: *Deleted

## 2023-09-05 LAB — KAPPA/LAMBDA LIGHT CHAINS
Kappa free light chain: 12.6 mg/L (ref 3.3–19.4)
Kappa, lambda light chain ratio: 1.68 — ABNORMAL HIGH (ref 0.26–1.65)
Lambda free light chains: 7.5 mg/L (ref 5.7–26.3)

## 2023-09-06 LAB — PROTEIN ELECTROPHORESIS, SERUM
A/G Ratio: 1.6 (ref 0.7–1.7)
Albumin ELP: 3.8 g/dL (ref 2.9–4.4)
Alpha-1-Globulin: 0.2 g/dL (ref 0.0–0.4)
Alpha-2-Globulin: 0.8 g/dL (ref 0.4–1.0)
Beta Globulin: 0.9 g/dL (ref 0.7–1.3)
Gamma Globulin: 0.5 g/dL (ref 0.4–1.8)
Globulin, Total: 2.4 g/dL (ref 2.2–3.9)
Total Protein ELP: 6.2 g/dL (ref 6.0–8.5)

## 2023-09-09 DIAGNOSIS — I10 Essential (primary) hypertension: Secondary | ICD-10-CM | POA: Diagnosis not present

## 2023-09-09 DIAGNOSIS — E782 Mixed hyperlipidemia: Secondary | ICD-10-CM | POA: Diagnosis not present

## 2023-09-09 DIAGNOSIS — E1122 Type 2 diabetes mellitus with diabetic chronic kidney disease: Secondary | ICD-10-CM | POA: Diagnosis not present

## 2023-09-09 DIAGNOSIS — K219 Gastro-esophageal reflux disease without esophagitis: Secondary | ICD-10-CM | POA: Diagnosis not present

## 2023-09-11 DIAGNOSIS — E1122 Type 2 diabetes mellitus with diabetic chronic kidney disease: Secondary | ICD-10-CM | POA: Diagnosis not present

## 2023-09-11 DIAGNOSIS — I10 Essential (primary) hypertension: Secondary | ICD-10-CM | POA: Diagnosis not present

## 2023-09-11 DIAGNOSIS — E559 Vitamin D deficiency, unspecified: Secondary | ICD-10-CM | POA: Diagnosis not present

## 2023-09-11 DIAGNOSIS — N1831 Chronic kidney disease, stage 3a: Secondary | ICD-10-CM | POA: Diagnosis not present

## 2023-09-11 DIAGNOSIS — E782 Mixed hyperlipidemia: Secondary | ICD-10-CM | POA: Diagnosis not present

## 2023-09-11 DIAGNOSIS — K219 Gastro-esophageal reflux disease without esophagitis: Secondary | ICD-10-CM | POA: Diagnosis not present

## 2023-09-11 DIAGNOSIS — E1140 Type 2 diabetes mellitus with diabetic neuropathy, unspecified: Secondary | ICD-10-CM | POA: Diagnosis not present

## 2023-09-11 DIAGNOSIS — M810 Age-related osteoporosis without current pathological fracture: Secondary | ICD-10-CM | POA: Diagnosis not present

## 2023-09-11 DIAGNOSIS — D649 Anemia, unspecified: Secondary | ICD-10-CM | POA: Diagnosis not present

## 2023-09-11 DIAGNOSIS — C9000 Multiple myeloma not having achieved remission: Secondary | ICD-10-CM | POA: Diagnosis not present

## 2023-09-13 DIAGNOSIS — H524 Presbyopia: Secondary | ICD-10-CM | POA: Diagnosis not present

## 2023-09-17 ENCOUNTER — Other Ambulatory Visit: Payer: Self-pay

## 2023-09-20 DIAGNOSIS — S92302D Fracture of unspecified metatarsal bone(s), left foot, subsequent encounter for fracture with routine healing: Secondary | ICD-10-CM | POA: Diagnosis not present

## 2023-09-27 DIAGNOSIS — E1122 Type 2 diabetes mellitus with diabetic chronic kidney disease: Secondary | ICD-10-CM | POA: Diagnosis not present

## 2023-09-27 DIAGNOSIS — K219 Gastro-esophageal reflux disease without esophagitis: Secondary | ICD-10-CM | POA: Diagnosis not present

## 2023-09-27 DIAGNOSIS — N1831 Chronic kidney disease, stage 3a: Secondary | ICD-10-CM | POA: Diagnosis not present

## 2023-09-27 DIAGNOSIS — C9000 Multiple myeloma not having achieved remission: Secondary | ICD-10-CM | POA: Diagnosis not present

## 2023-09-27 DIAGNOSIS — D649 Anemia, unspecified: Secondary | ICD-10-CM | POA: Diagnosis not present

## 2023-09-27 DIAGNOSIS — G2581 Restless legs syndrome: Secondary | ICD-10-CM | POA: Diagnosis not present

## 2023-09-27 DIAGNOSIS — E782 Mixed hyperlipidemia: Secondary | ICD-10-CM | POA: Diagnosis not present

## 2023-09-27 DIAGNOSIS — E1140 Type 2 diabetes mellitus with diabetic neuropathy, unspecified: Secondary | ICD-10-CM | POA: Diagnosis not present

## 2023-09-27 DIAGNOSIS — M810 Age-related osteoporosis without current pathological fracture: Secondary | ICD-10-CM | POA: Diagnosis not present

## 2023-09-27 DIAGNOSIS — E559 Vitamin D deficiency, unspecified: Secondary | ICD-10-CM | POA: Diagnosis not present

## 2023-09-27 DIAGNOSIS — I10 Essential (primary) hypertension: Secondary | ICD-10-CM | POA: Diagnosis not present

## 2023-10-01 DIAGNOSIS — M25572 Pain in left ankle and joints of left foot: Secondary | ICD-10-CM | POA: Diagnosis not present

## 2023-10-01 DIAGNOSIS — E782 Mixed hyperlipidemia: Secondary | ICD-10-CM | POA: Diagnosis not present

## 2023-10-01 DIAGNOSIS — I10 Essential (primary) hypertension: Secondary | ICD-10-CM | POA: Diagnosis not present

## 2023-10-01 DIAGNOSIS — E1140 Type 2 diabetes mellitus with diabetic neuropathy, unspecified: Secondary | ICD-10-CM | POA: Diagnosis not present

## 2023-10-01 DIAGNOSIS — M810 Age-related osteoporosis without current pathological fracture: Secondary | ICD-10-CM | POA: Diagnosis not present

## 2023-10-02 ENCOUNTER — Inpatient Hospital Stay

## 2023-10-02 ENCOUNTER — Inpatient Hospital Stay: Attending: Hematology

## 2023-10-02 VITALS — BP 142/80 | HR 68 | Temp 97.2°F | Resp 18

## 2023-10-02 DIAGNOSIS — Z5112 Encounter for antineoplastic immunotherapy: Secondary | ICD-10-CM | POA: Insufficient documentation

## 2023-10-02 DIAGNOSIS — C9000 Multiple myeloma not having achieved remission: Secondary | ICD-10-CM | POA: Insufficient documentation

## 2023-10-02 LAB — CBC WITH DIFFERENTIAL/PLATELET
Abs Immature Granulocytes: 0.05 10*3/uL (ref 0.00–0.07)
Basophils Absolute: 0.1 10*3/uL (ref 0.0–0.1)
Basophils Relative: 1 %
Eosinophils Absolute: 1.7 10*3/uL — ABNORMAL HIGH (ref 0.0–0.5)
Eosinophils Relative: 15 %
HCT: 39.7 % (ref 39.0–52.0)
Hemoglobin: 13.8 g/dL (ref 13.0–17.0)
Immature Granulocytes: 1 %
Lymphocytes Relative: 20 %
Lymphs Abs: 2.2 10*3/uL (ref 0.7–4.0)
MCH: 32.5 pg (ref 26.0–34.0)
MCHC: 34.8 g/dL (ref 30.0–36.0)
MCV: 93.6 fL (ref 80.0–100.0)
Monocytes Absolute: 0.7 10*3/uL (ref 0.1–1.0)
Monocytes Relative: 7 %
Neutro Abs: 6.4 10*3/uL (ref 1.7–7.7)
Neutrophils Relative %: 56 %
Platelets: 257 10*3/uL (ref 150–400)
RBC: 4.24 MIL/uL (ref 4.22–5.81)
RDW: 13 % (ref 11.5–15.5)
WBC: 11.1 10*3/uL — ABNORMAL HIGH (ref 4.0–10.5)
nRBC: 0 % (ref 0.0–0.2)

## 2023-10-02 LAB — CMP (CANCER CENTER ONLY)
ALT: 12 U/L (ref 0–44)
AST: 15 U/L (ref 15–41)
Albumin: 4 g/dL (ref 3.5–5.0)
Alkaline Phosphatase: 77 U/L (ref 38–126)
Anion gap: 12 (ref 5–15)
BUN: 19 mg/dL (ref 8–23)
CO2: 27 mmol/L (ref 22–32)
Calcium: 9.6 mg/dL (ref 8.9–10.3)
Chloride: 100 mmol/L (ref 98–111)
Creatinine: 1.69 mg/dL — ABNORMAL HIGH (ref 0.61–1.24)
GFR, Estimated: 41 mL/min — ABNORMAL LOW (ref >60)
Glucose, Bld: 139 mg/dL — ABNORMAL HIGH (ref 70–99)
Potassium: 3.6 mmol/L (ref 3.5–5.1)
Sodium: 139 mmol/L (ref 135–145)
Total Bilirubin: 1 mg/dL (ref 0.0–1.2)
Total Protein: 6.7 g/dL (ref 6.5–8.1)

## 2023-10-02 LAB — MAGNESIUM: Magnesium: 1.8 mg/dL (ref 1.7–2.4)

## 2023-10-02 MED ORDER — ZOLEDRONIC ACID 4 MG/100ML IV SOLN
4.0000 mg | Freq: Once | INTRAVENOUS | Status: AC
  Administered 2023-10-02: 4 mg via INTRAVENOUS
  Filled 2023-10-02: qty 100

## 2023-10-02 MED ORDER — SODIUM CHLORIDE 0.9 % IV SOLN: Freq: Once | INTRAVENOUS | Status: DC

## 2023-10-02 MED ORDER — ACETAMINOPHEN 325 MG PO TABS: 650.0000 mg | ORAL_TABLET | Freq: Once | ORAL | Status: DC

## 2023-10-02 MED ORDER — CETIRIZINE HCL 10 MG PO TABS: 10.0000 mg | ORAL_TABLET | Freq: Every day | ORAL | Status: DC

## 2023-10-02 MED ORDER — SODIUM CHLORIDE 0.9 % IV SOLN: INTRAVENOUS | Status: DC

## 2023-10-02 MED ORDER — SODIUM CHLORIDE 0.9 % IV SOLN: Freq: Once | INTRAVENOUS | Status: AC

## 2023-10-02 MED ORDER — DARATUMUMAB-HYALURONIDASE-FIHJ 1800-30000 MG-UT/15ML ~~LOC~~ SOLN
1800.0000 mg | Freq: Once | SUBCUTANEOUS | Status: AC
  Administered 2023-10-02: 1800 mg via SUBCUTANEOUS
  Filled 2023-10-02: qty 15

## 2023-10-02 NOTE — Progress Notes (Signed)
 Per pt he took his premedications at home prior to appt. Creatinine 1.69- per MD to proceed with treatment, zometa , and give 500mL NS over 1 hour. Pt updated and all questions answered at this time  Patient tolerated Daratumumab  injection with no complaints voiced.  See MAR for details.  Labs reviewed. Injection site clean and dry with no bruising or swelling noted at site.  Band aid applied.  Vss with discharge and left in satisfactory condition with no s/s of distress noted. All follow ups as scheduled.   Darshana Curnutt

## 2023-10-02 NOTE — Progress Notes (Signed)
 Proceed with zometa  4 mg despite slight increase in creatinine.  V.O. Dr Theadore Molt, PharmD

## 2023-10-02 NOTE — Progress Notes (Signed)
 Patient presents today for Darzalex  Faspro and Zometa . Creatinine 1.69. Message received from Dr. Rogers to proceed with Zometa  4 mg and infuse 500 mls of normal saline over an hour.

## 2023-10-02 NOTE — Patient Instructions (Signed)
 [facility] [place] - A DEPT OF [facility]  Discharge Instructions: Thank you for choosing [facility] to provide your oncology and hematology care.  If you have a lab appointment with the Cancer Center - please note that after April 8th, 2024, all labs will be drawn in the cancer center.  You do not have to check in or register with the main entrance as you have in the past but will complete your check-in in the cancer center.  Wear comfortable clothing and clothing appropriate for easy access to any Portacath or PICC line.   We strive to give you quality time with your provider. You may need to reschedule your appointment if you arrive late (15 or more minutes).  Arriving late affects you and other patients whose appointments are after yours.  Also, if you miss three or more appointments without notifying the office, you may be dismissed from the clinic at the provider's discretion.      For prescription refill requests, have your pharmacy contact our office and allow 72 hours for refills to be completed.    Today you received the following chemotherapy and/or immunotherapy agents Darzalex  SQ   To help prevent nausea and vomiting after your treatment, we encourage you to take your nausea medication as directed.  BELOW ARE SYMPTOMS THAT SHOULD BE REPORTED IMMEDIATELY: *FEVER GREATER THAN 100.4 F (38 C) OR HIGHER *CHILLS OR SWEATING *NAUSEA AND VOMITING THAT IS NOT CONTROLLED WITH YOUR NAUSEA MEDICATION *UNUSUAL SHORTNESS OF BREATH *UNUSUAL BRUISING OR BLEEDING *URINARY PROBLEMS (pain or burning when urinating, or frequent urination) *BOWEL PROBLEMS (unusual diarrhea, constipation, pain near the anus) TENDERNESS IN MOUTH AND THROAT WITH OR WITHOUT PRESENCE OF ULCERS (sore throat, sores in mouth, or a toothache) UNUSUAL RASH, SWELLING OR PAIN  UNUSUAL VAGINAL DISCHARGE OR ITCHING   Items with * indicate a potential emergency and should be followed up  as soon as possible or go to the Emergency Department if any problems should occur.  Please show the CHEMOTHERAPY ALERT CARD or IMMUNOTHERAPY ALERT CARD at check-in to the Emergency Department and triage nurse.  Should you have questions after your visit or need to cancel or reschedule your appointment, please contact [facility] [place] - A DEPT OF [facility] [phone]  and follow the prompts.  Office hours are 8:00 a.m. to 4:30 p.m. Monday - Friday. Please note that voicemails left after 4:00 p.m. may not be returned until the following business day.  We are closed weekends and major holidays. You have access to a nurse at all times for urgent questions. Please call the main number to the clinic [phone] and follow the prompts.  For any non-urgent questions, you may also contact your provider using MyChart. We now offer e-Visits for anyone 68 and older to request care online for non-urgent symptoms. For details visit [email or web address].   Also download the MyChart app! Go to the app store, search MyChart, open the app, select [place], and log in with your MyChart username and password.

## 2023-10-03 LAB — KAPPA/LAMBDA LIGHT CHAINS
Kappa free light chain: 13.5 mg/L (ref 3.3–19.4)
Kappa, lambda light chain ratio: 1.53 (ref 0.26–1.65)
Lambda free light chains: 8.8 mg/L (ref 5.7–26.3)

## 2023-10-04 LAB — PROTEIN ELECTROPHORESIS, SERUM
A/G Ratio: 1.7 (ref 0.7–1.7)
Albumin ELP: 3.8 g/dL (ref 2.9–4.4)
Alpha-1-Globulin: 0.2 g/dL (ref 0.0–0.4)
Alpha-2-Globulin: 0.7 g/dL (ref 0.4–1.0)
Beta Globulin: 0.9 g/dL (ref 0.7–1.3)
Gamma Globulin: 0.4 g/dL (ref 0.4–1.8)
Globulin, Total: 2.2 g/dL (ref 2.2–3.9)
Total Protein ELP: 6 g/dL (ref 6.0–8.5)

## 2023-10-15 DIAGNOSIS — E1140 Type 2 diabetes mellitus with diabetic neuropathy, unspecified: Secondary | ICD-10-CM | POA: Diagnosis not present

## 2023-10-15 DIAGNOSIS — E1122 Type 2 diabetes mellitus with diabetic chronic kidney disease: Secondary | ICD-10-CM | POA: Diagnosis not present

## 2023-10-15 DIAGNOSIS — E559 Vitamin D deficiency, unspecified: Secondary | ICD-10-CM | POA: Diagnosis not present

## 2023-10-15 DIAGNOSIS — M810 Age-related osteoporosis without current pathological fracture: Secondary | ICD-10-CM | POA: Diagnosis not present

## 2023-10-15 DIAGNOSIS — G2581 Restless legs syndrome: Secondary | ICD-10-CM | POA: Diagnosis not present

## 2023-10-15 DIAGNOSIS — E782 Mixed hyperlipidemia: Secondary | ICD-10-CM | POA: Diagnosis not present

## 2023-10-15 DIAGNOSIS — D649 Anemia, unspecified: Secondary | ICD-10-CM | POA: Diagnosis not present

## 2023-10-15 DIAGNOSIS — K219 Gastro-esophageal reflux disease without esophagitis: Secondary | ICD-10-CM | POA: Diagnosis not present

## 2023-10-15 DIAGNOSIS — I10 Essential (primary) hypertension: Secondary | ICD-10-CM | POA: Diagnosis not present

## 2023-10-15 DIAGNOSIS — N1831 Chronic kidney disease, stage 3a: Secondary | ICD-10-CM | POA: Diagnosis not present

## 2023-10-15 DIAGNOSIS — C9000 Multiple myeloma not having achieved remission: Secondary | ICD-10-CM | POA: Diagnosis not present

## 2023-10-22 ENCOUNTER — Other Ambulatory Visit: Payer: Self-pay

## 2023-10-22 DIAGNOSIS — C9000 Multiple myeloma not having achieved remission: Secondary | ICD-10-CM

## 2023-10-23 ENCOUNTER — Inpatient Hospital Stay

## 2023-10-23 DIAGNOSIS — C9000 Multiple myeloma not having achieved remission: Secondary | ICD-10-CM | POA: Diagnosis not present

## 2023-10-23 DIAGNOSIS — Z5112 Encounter for antineoplastic immunotherapy: Secondary | ICD-10-CM | POA: Diagnosis not present

## 2023-10-23 LAB — CBC WITH DIFFERENTIAL/PLATELET
Abs Immature Granulocytes: 0.05 K/uL (ref 0.00–0.07)
Basophils Absolute: 0.1 K/uL (ref 0.0–0.1)
Basophils Relative: 1 %
Eosinophils Absolute: 1.3 K/uL — ABNORMAL HIGH (ref 0.0–0.5)
Eosinophils Relative: 11 %
HCT: 40.2 % (ref 39.0–52.0)
Hemoglobin: 14.1 g/dL (ref 13.0–17.0)
Immature Granulocytes: 0 %
Lymphocytes Relative: 21 %
Lymphs Abs: 2.4 K/uL (ref 0.7–4.0)
MCH: 32.8 pg (ref 26.0–34.0)
MCHC: 35.1 g/dL (ref 30.0–36.0)
MCV: 93.5 fL (ref 80.0–100.0)
Monocytes Absolute: 0.9 K/uL (ref 0.1–1.0)
Monocytes Relative: 8 %
Neutro Abs: 6.6 K/uL (ref 1.7–7.7)
Neutrophils Relative %: 59 %
Platelets: 257 K/uL (ref 150–400)
RBC: 4.3 MIL/uL (ref 4.22–5.81)
RDW: 12.6 % (ref 11.5–15.5)
WBC: 11.3 K/uL — ABNORMAL HIGH (ref 4.0–10.5)
nRBC: 0 % (ref 0.0–0.2)

## 2023-10-23 LAB — COMPREHENSIVE METABOLIC PANEL WITH GFR
ALT: 11 U/L (ref 0–44)
AST: 14 U/L — ABNORMAL LOW (ref 15–41)
Albumin: 4 g/dL (ref 3.5–5.0)
Alkaline Phosphatase: 82 U/L (ref 38–126)
Anion gap: 14 (ref 5–15)
BUN: 30 mg/dL — ABNORMAL HIGH (ref 8–23)
CO2: 26 mmol/L (ref 22–32)
Calcium: 10 mg/dL (ref 8.9–10.3)
Chloride: 98 mmol/L (ref 98–111)
Creatinine, Ser: 1.84 mg/dL — ABNORMAL HIGH (ref 0.61–1.24)
GFR, Estimated: 37 mL/min — ABNORMAL LOW (ref >60)
Glucose, Bld: 158 mg/dL — ABNORMAL HIGH (ref 70–99)
Potassium: 3.6 mmol/L (ref 3.5–5.1)
Sodium: 138 mmol/L (ref 135–145)
Total Bilirubin: 0.9 mg/dL (ref 0.0–1.2)
Total Protein: 6.8 g/dL (ref 6.5–8.1)

## 2023-10-23 LAB — MAGNESIUM: Magnesium: 1.9 mg/dL (ref 1.7–2.4)

## 2023-10-24 LAB — KAPPA/LAMBDA LIGHT CHAINS
Kappa free light chain: 15.4 mg/L (ref 3.3–19.4)
Kappa, lambda light chain ratio: 1 (ref 0.26–1.65)
Lambda free light chains: 15.4 mg/L (ref 5.7–26.3)

## 2023-10-26 LAB — PROTEIN ELECTROPHORESIS, SERUM
A/G Ratio: 1.7 (ref 0.7–1.7)
Albumin ELP: 4 g/dL (ref 2.9–4.4)
Alpha-1-Globulin: 0.2 g/dL (ref 0.0–0.4)
Alpha-2-Globulin: 0.8 g/dL (ref 0.4–1.0)
Beta Globulin: 0.9 g/dL (ref 0.7–1.3)
Gamma Globulin: 0.3 g/dL — ABNORMAL LOW (ref 0.4–1.8)
Globulin, Total: 2.3 g/dL (ref 2.2–3.9)
Total Protein ELP: 6.3 g/dL (ref 6.0–8.5)

## 2023-10-30 ENCOUNTER — Inpatient Hospital Stay

## 2023-10-30 ENCOUNTER — Inpatient Hospital Stay (HOSPITAL_BASED_OUTPATIENT_CLINIC_OR_DEPARTMENT_OTHER): Admitting: Oncology

## 2023-10-30 VITALS — BP 125/72 | HR 70 | Temp 98.1°F | Resp 18 | Wt 191.0 lb

## 2023-10-30 DIAGNOSIS — Z5112 Encounter for antineoplastic immunotherapy: Secondary | ICD-10-CM | POA: Diagnosis not present

## 2023-10-30 DIAGNOSIS — C9001 Multiple myeloma in remission: Secondary | ICD-10-CM | POA: Diagnosis not present

## 2023-10-30 DIAGNOSIS — C9000 Multiple myeloma not having achieved remission: Secondary | ICD-10-CM | POA: Diagnosis not present

## 2023-10-30 DIAGNOSIS — M810 Age-related osteoporosis without current pathological fracture: Secondary | ICD-10-CM | POA: Diagnosis not present

## 2023-10-30 MED ORDER — DARATUMUMAB-HYALURONIDASE-FIHJ 1800-30000 MG-UT/15ML ~~LOC~~ SOLN
1800.0000 mg | Freq: Once | SUBCUTANEOUS | Status: AC
  Administered 2023-10-30: 1800 mg via SUBCUTANEOUS
  Filled 2023-10-30: qty 15

## 2023-10-30 MED ORDER — SODIUM CHLORIDE 0.9 % IV SOLN: Freq: Once | INTRAVENOUS | Status: AC

## 2023-10-30 MED ORDER — ZOLEDRONIC ACID 4 MG/5ML IV CONC
3.0000 mg | Freq: Once | INTRAVENOUS | Status: AC
  Administered 2023-10-30: 3 mg via INTRAVENOUS
  Filled 2023-10-30: qty 3.75

## 2023-10-30 NOTE — Progress Notes (Signed)
 Patient tolerated therapy with no complaints voiced. Labs reviewed.   Side effects with management reviewed with understanding verbalized.  Peripheral IV site clean and dry with no bruising or swelling noted at site.  Good blood return noted before and after administration of therapy.  Band aid applied.  Patient left in satisfactory condition with VSS and no s/s of distress noted.   Patient tolerated Daratumumab  injection with no complaints voiced.  See MAR for details.  Labs reviewed. Injection site clean and dry with no bruising or swelling noted at site.  Band aid applied.  Vss with discharge and left in satisfactory condition with no s/s of distress noted.

## 2023-10-30 NOTE — Progress Notes (Signed)
 Zometa  decreased to 3mg  d/t Scr=1.89 mg/dL and CrCl ~64fO/fpw per Dr. Davonna.  Orian Amberg, PharmD, MBA

## 2023-10-30 NOTE — Progress Notes (Unsigned)
 Patient Care Team: Shona Norleen PEDLAR, MD as PCP - General (Internal Medicine) Alvan Dorn FALCON, MD as Consulting Physician (Cardiology) Rogers Hai, MD as Medical Oncologist (Medical Oncology)  Clinic Day:  10/30/2023  Referring physician: Shona Norleen PEDLAR, MD   CHIEF COMPLAINT:  CC: Multiple myeloma  [name] [name] 78 y.o. male was transferred to my care after his prior physician has left.   ASSESSMENT & PLAN:   Assessment & Plan: [name] [name]  is a 78 y.o. male with multiple myeloma  Assessment & Plan Multiple myeloma in remission [facility]) Patient has intermediate risk multiple myeloma diagnosed in 01/2022 s/p Dara VRD.  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 Developed severe rash with Revlimid  and is currently off of it since 11/23/2022 Continuing single agent daratumumab  every month Reviewed Labs today: No M spike, normal serum free light chains with a normal ratio  - Continue daratumumab  monthly.  Continue acyclovir  prophylaxis.  Return to clinic in 12 weeks with labs. Age related osteoporosis, unspecified pathological fracture presence DEXA 09/11/2022 with a T-score of -3.5 Monthly Zometa  started on 01/30/2023 History of multiple fractures including recent metatarsal fracture diagnosed on 11/08/2022. Current calcium: 10  - Continue calcium supplements.  Continue monthly Zometa .    The patient understands the plans discussed today and is in agreement with them.  He knows to contact our office if he develops concerns prior to his next appointment.  I provided 40 minutes of face-to-face time during this encounter and > 50% was spent counseling as documented under my assessment and plan.    Mickiel Dry, MD  [facility] [place] - A DEPT OF [facility] [address] [place] [address] Dept: [phone] Dept Fax: [phone]   No orders of the defined types were placed  in this encounter.    ONCOLOGY HISTORY:   Oncology History  Multiple myeloma (HCC)  04/11/2022 Initial Diagnosis   Multiple myeloma (HCC)   04/11/2022 Cancer Staging   Staging form: Plasma Cell Myeloma and Plasma Cell Disorders, AJCC 8th Edition - Clinical stage from 04/11/2022: RISS Stage II (Beta-2 -microglobulin (mg/L): 4.1, Albumin (g/dL): 2.7, ISS: Stage II, High-risk cytogenetics: Absent, LDH: Normal) - Signed by Rogers Hai, MD on 04/11/2022 Histopathologic type: Multiple myeloma Beta 2 microglobulin range (mg/L): 3.5 to 5.49 Albumin range (g/dL): Less than 3.5 Cytogenetics: No abnormalities   05/01/2022 -  Chemotherapy   Patient is on Treatment Plan : MYELOMA  Daratumumab  SQ + Lenalidomide  + Dexamethasone  (DaraRd) q28d         Current Treatment: Daratumumab  maintenance  INTERVAL HISTORY:   [name] [name] is here today for follow up. Patient is accompanied by his wife today.  He reports some peripheral neuropathy from diabetes and is taking gabapentin.  He denies any other complaints today.  He is tolerating daratumumab  well and does not report any shortness of breath or cough.   I have reviewed the past medical history, past surgical history, social history and family history with the patient and they are unchanged from previous note.  ALLERGIES:  is allergic to penicillins and gadavist  [gadobutrol ].  MEDICATIONS:  Current Outpatient Medications  Medication Sig Dispense Refill   ACCU-CHEK AVIVA PLUS test strip USE 1 STRIP TO CHECK GLUCOSE TWICE DAILY     Accu-Chek FastClix Lancets MISC Apply topically 2 (two) times daily.     acyclovir  (ZOVIRAX ) 400 MG tablet Take 1 tablet (400 mg total) by mouth 2 (two)  times daily. 180 tablet 3   aspirin  EC 81 MG tablet Take 1 tablet (81 mg total) by mouth daily. Swallow whole. (Patient taking differently: Take 81 mg by mouth daily. Swallow whole. Takes 3 times a week) 30 tablet 12   atenolol-chlorthalidone (TENORETIC) 50-25  MG per tablet Take 1 tablet by mouth daily. Takes 1/2 daily per Dr vonzell     cyanocobalamin  (VITAMIN B12) 1000 MCG/ML injection Inject 1,000 mcg into the muscle every 30 (thirty) days.     Daratumumab -Hyaluronidase -fihj (DARZALEX  FASPRO [place]) Inject into the skin.     dexamethasone  (DECADRON ) 4 MG tablet Take 5 tablets (20 mg total) by mouth once a week. 30 tablet 6   diclofenac sodium (VOLTAREN) 1 % GEL Apply 2 g topically daily as needed.     gabapentin (NEURONTIN) 300 MG capsule Take 300 mg by mouth 3 (three) times daily.     hydrOXYzine  (ATARAX ) 25 MG tablet Take 1 tablet (25 mg total) by mouth every 6 (six) hours as needed. 60 tablet 0   loperamide (IMODIUM A-D) 2 MG tablet Take 2 mg by mouth 4 (four) times daily as needed for diarrhea or loose stools.     losartan (COZAAR) 50 MG tablet Take 50 mg by mouth daily.     MAGNESIUM -OXIDE 400 (240 Mg) MG tablet Take 1 tablet by mouth twice daily 120 tablet 0   metFORMIN (GLUCOPHAGE) 500 MG tablet Take 500 mg by mouth 2 (two) times daily with a meal.     pantoprazole (PROTONIX) 40 MG tablet Take 40 mg by mouth daily.     prochlorperazine  (COMPAZINE ) 10 MG tablet Take 1 tablet (10 mg total) by mouth every 6 (six) hours as needed for nausea or vomiting. 30 tablet 3   silver sulfADIAZINE (SILVADENE) 1 % cream Apply topically.     No current facility-administered medications for this visit.    REVIEW OF SYSTEMS:   Constitutional: Denies fevers, chills or abnormal weight loss Eyes: Denies blurriness of vision Ears, nose, mouth, throat, and face: Denies mucositis or sore throat Respiratory: Denies cough, dyspnea or wheezes Cardiovascular: Denies palpitation, chest discomfort or lower extremity swelling Gastrointestinal:  Denies nausea, heartburn or change in bowel habits Skin: Denies abnormal skin rashes Lymphatics: Denies new lymphadenopathy or easy bruising Neurological:Denies numbness, tingling or new weaknesses Behavioral/Psych: Mood is  stable, no new changes  All other systems were reviewed with the patient and are negative.   VITALS:  There were no vitals taken for this visit.  Wt Readings from Last 3 Encounters:  09/04/23 188 lb 12.8 oz (85.6 kg)  08/07/23 187 lb (84.8 kg)  06/26/23 189 lb 6 oz (85.9 kg)    There is no height or weight on file to calculate BMI.  Performance status (ECOG): 1 - Symptomatic but completely ambulatory  PHYSICAL EXAM:   GENERAL:alert, no distress and comfortable SKIN: skin color, texture, turgor are normal, no rashes or significant lesions LYMPH:  no palpable lymphadenopathy in the cervical, axillary or inguinal LUNGS: clear to auscultation and percussion with normal breathing effort HEART: regular rate & rhythm and no murmurs and no lower extremity edema ABDOMEN:abdomen soft, non-tender and normal bowel sounds Musculoskeletal:no cyanosis of digits and no clubbing  NEURO: alert & oriented x 3 with fluent speech  LABORATORY DATA:  I have reviewed the data as listed    Component Value Date/Time   NA 138 10/23/2023 1312   NA 137 08/13/2018 0000   K 3.6 10/23/2023 1312   CL 98  10/23/2023 1312   CO2 26 10/23/2023 1312   GLUCOSE 158 (H) 10/23/2023 1312   BUN 30 (H) 10/23/2023 1312   BUN 22 (A) 08/13/2018 0000   CREATININE 1.84 (H) 10/23/2023 1312   CREATININE 1.69 (H) 10/02/2023 1301   CALCIUM 10.0 10/23/2023 1312   PROT 6.8 10/23/2023 1312   ALBUMIN 4.0 10/23/2023 1312   AST 14 (L) 10/23/2023 1312   AST 15 10/02/2023 1301   ALT 11 10/23/2023 1312   ALT 12 10/02/2023 1301   ALKPHOS 82 10/23/2023 1312   BILITOT 0.9 10/23/2023 1312   BILITOT 1.0 10/02/2023 1301   GFRNONAA 37 (L) 10/23/2023 1312   GFRNONAA 41 (L) 10/02/2023 1301   GFRAA 74 08/13/2018 0000     Lab Results  Component Value Date   WBC 11.3 (H) 10/23/2023   NEUTROABS 6.6 10/23/2023   HGB 14.1 10/23/2023   HCT 40.2 10/23/2023   MCV 93.5 10/23/2023   PLT 257 10/23/2023      Chemistry      Component  Value Date/Time   NA 138 10/23/2023 1312   NA 137 08/13/2018 0000   K 3.6 10/23/2023 1312   CL 98 10/23/2023 1312   CO2 26 10/23/2023 1312   BUN 30 (H) 10/23/2023 1312   BUN 22 (A) 08/13/2018 0000   CREATININE 1.84 (H) 10/23/2023 1312   CREATININE 1.69 (H) 10/02/2023 1301   GLU 168 08/13/2018 0000      Component Value Date/Time   CALCIUM 10.0 10/23/2023 1312   ALKPHOS 82 10/23/2023 1312   AST 14 (L) 10/23/2023 1312   AST 15 10/02/2023 1301   ALT 11 10/23/2023 1312   ALT 12 10/02/2023 1301   BILITOT 0.9 10/23/2023 1312   BILITOT 1.0 10/02/2023 1301      Latest Reference Range & Units 10/23/23 13:12  Total Protein ELP 6.0 - 8.5 g/dL 6.3  Albumin ELP 2.9 - 4.4 g/dL 4.0  Globulin, Total 2.2 - 3.9 g/dL 2.3 (C)  A/G Ratio 0.7 - 1.7  1.7 (C)  Alpha-1-Globulin 0.0 - 0.4 g/dL 0.2  Joeyj-7-Honalopw 0.4 - 1.0 g/dL 0.8  Beta Globulin 0.7 - 1.3 g/dL 0.9  Gamma Globulin 0.4 - 1.8 g/dL 0.3 (L)  M-SPIKE, % Not Observed g/dL Not Observed  SPE Interp.  The SPE pattern reflects hypogammaglobulinemia.   Comment  Comment  (L): Data is abnormally low (C): Corrected   Latest Reference Range & Units 10/23/23 13:12  Kappa free light chain 3.3 - 19.4 mg/L 15.4  Lambda free light chains 5.7 - 26.3 mg/L 15.4  Kappa, lambda light chain ratio 0.26 - 1.65  1.00    RADIOGRAPHIC STUDIES: I have personally reviewed the radiological images as listed and agreed with the findings in the report.  CT FOOT LEFT WO CONTRAST CLINICAL DATA:  Left foot pain.  No known injury  EXAM: CT OF THE LEFT FOOT WITHOUT CONTRAST  TECHNIQUE: Multidetector CT imaging of the left foot was performed according to the standard protocol. Multiplanar CT image reconstructions were also generated.  RADIATION DOSE REDUCTION: This exam was performed according to the departmental dose-optimization program which includes automated exposure control, adjustment of the mA and/or kV according to patient size and/or use of  iterative reconstruction technique.  COMPARISON:  X-ray 11/28/2010  FINDINGS: Bones/Joint/Cartilage  Subacute-appearing fracture of the fourth metatarsal head and neck with mild impaction at the level of the metatarsal neck (series 6, image 126). There is some early callus formation indicative of healing. Appearance of the third  metatarsal neck suggests prior healed fracture (series 6, image 111). No additional fractures are identified. No malalignment. Mild osteoarthritis is most pronounced at the first MTP joint. No lytic or sclerotic bone lesion.  Ligaments  Suboptimally assessed by CT.  Muscles and Tendons  Fatty atrophy of the foot musculature suggesting chronic denervation. No acute tendinous abnormality by CT.  Soft tissues  No soft tissue swelling or fluid collection.  IMPRESSION: 1. Subacute-appearing fracture of the fourth metatarsal head and neck. 2. Appearance of the third metatarsal neck suggests prior healed fracture. 3. Mild osteoarthritis is most pronounced at the first MTP joint.  Electronically Signed   By: Mabel Converse D.O.   On: 11/08/2022 14:37

## 2023-10-30 NOTE — Assessment & Plan Note (Signed)
 DEXA 09/11/2022 with a T-score of -3.5 Monthly Zometa  started on 01/30/2023 History of multiple fractures including recent metatarsal fracture diagnosed on 11/08/2022. Current calcium: 10  - Continue calcium supplements.  Continue monthly Zometa .

## 2023-10-30 NOTE — Patient Instructions (Signed)
 [facility] [place] [facility]  Discharge Instructions: Thank you for choosing [facility] to provide your oncology and hematology care.  If you have a lab appointment with the Cancer Center - please note that after April 8th, 2024, all labs will be drawn in the cancer center.  You do not have to check in or register with the main entrance as you have in the past but will complete your check-in in the cancer center.  Wear comfortable clothing and clothing appropriate for easy access to any Portacath or PICC line.   We strive to give you quality time with your provider. You may need to reschedule your appointment if you arrive late (15 or more minutes).  Arriving late affects you and other patients whose appointments are after yours.  Also, if you miss three or more appointments without notifying the office, you may be dismissed from the clinic at the provider's discretion.      For prescription refill requests, have your pharmacy contact our office and allow 72 hours for refills to be completed.    Today you received the following chemotherapy and/or immunotherapy agents Darzalex    To help prevent nausea and vomiting after your treatment, we encourage you to take your nausea medication as directed.  BELOW ARE SYMPTOMS THAT SHOULD BE REPORTED IMMEDIATELY: *FEVER GREATER THAN 100.4 F (38 C) OR HIGHER *CHILLS OR SWEATING *NAUSEA AND VOMITING THAT IS NOT CONTROLLED WITH YOUR NAUSEA MEDICATION *UNUSUAL SHORTNESS OF BREATH *UNUSUAL BRUISING OR BLEEDING *URINARY PROBLEMS (pain or burning when urinating, or frequent urination) *BOWEL PROBLEMS (unusual diarrhea, constipation, pain near the anus) TENDERNESS IN MOUTH AND THROAT WITH OR WITHOUT PRESENCE OF ULCERS (sore throat, sores in mouth, or a toothache) UNUSUAL RASH, SWELLING OR PAIN  UNUSUAL VAGINAL DISCHARGE OR ITCHING   Items with * indicate a potential emergency and should be followed up as  soon as possible or go to the Emergency Department if any problems should occur.  Please show the CHEMOTHERAPY ALERT CARD or IMMUNOTHERAPY ALERT CARD at check-in to the Emergency Department and triage nurse.  Should you have questions after your visit or need to cancel or reschedule your appointment, please contact [facility] [place] - A DEPT OF Eligha Bridegroom [facility] [phone]  and follow the prompts.  Office hours are 8:00 a.m. to 4:30 p.m. Monday - Friday. Please note that voicemails left after 4:00 p.m. may not be returned until the following business day.  We are closed weekends and major holidays. You have access to a nurse at all times for urgent questions. Please call the main number to the clinic [phone] and follow the prompts.  For any non-urgent questions, you may also contact your provider using MyChart. We now offer e-Visits for anyone 2 and older to request care online for non-urgent symptoms. For details visit [email or web address].   Also download the MyChart app! Go to the app store, search "MyChart", open the app, select [place], and log in with your MyChart username and password.

## 2023-10-30 NOTE — Assessment & Plan Note (Addendum)
 Patient has intermediate risk multiple myeloma diagnosed in 01/2022 s/p Dara VRD.  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 Developed severe rash with Revlimid  and is currently off of it since 11/23/2022 Continuing single agent daratumumab  every month Reviewed Labs today: No M spike, normal serum free light chains with a normal ratio  - Continue daratumumab  monthly.  Continue acyclovir  prophylaxis.  Return to clinic in 12 weeks with labs.

## 2023-10-30 NOTE — Progress Notes (Signed)
 Patient presents today for chemotherapy infusion. Patient is in satisfactory condition with no new complaints voiced.  Vital signs are stable.  Labs reviewed by Dr. Davonna during the office visit and all other labs are within treatment parameters. Patient's Creatinine noted to be 1.84 today.  We will proceed with treatment per MD orders.   Patient took pre-meds at home prior to arrival.

## 2023-10-31 ENCOUNTER — Other Ambulatory Visit: Payer: Self-pay

## 2023-10-31 ENCOUNTER — Encounter: Payer: Self-pay | Admitting: Hematology

## 2023-11-10 ENCOUNTER — Other Ambulatory Visit: Payer: Self-pay

## 2023-11-13 ENCOUNTER — Other Ambulatory Visit: Payer: Self-pay

## 2023-11-22 DIAGNOSIS — C9000 Multiple myeloma not having achieved remission: Secondary | ICD-10-CM | POA: Diagnosis not present

## 2023-11-22 DIAGNOSIS — E559 Vitamin D deficiency, unspecified: Secondary | ICD-10-CM | POA: Diagnosis not present

## 2023-11-22 DIAGNOSIS — N1831 Chronic kidney disease, stage 3a: Secondary | ICD-10-CM | POA: Diagnosis not present

## 2023-11-22 DIAGNOSIS — E1140 Type 2 diabetes mellitus with diabetic neuropathy, unspecified: Secondary | ICD-10-CM | POA: Diagnosis not present

## 2023-11-22 DIAGNOSIS — K219 Gastro-esophageal reflux disease without esophagitis: Secondary | ICD-10-CM | POA: Diagnosis not present

## 2023-11-22 DIAGNOSIS — E1122 Type 2 diabetes mellitus with diabetic chronic kidney disease: Secondary | ICD-10-CM | POA: Diagnosis not present

## 2023-11-22 DIAGNOSIS — E782 Mixed hyperlipidemia: Secondary | ICD-10-CM | POA: Diagnosis not present

## 2023-11-22 DIAGNOSIS — I10 Essential (primary) hypertension: Secondary | ICD-10-CM | POA: Diagnosis not present

## 2023-11-22 DIAGNOSIS — D649 Anemia, unspecified: Secondary | ICD-10-CM | POA: Diagnosis not present

## 2023-11-22 DIAGNOSIS — M810 Age-related osteoporosis without current pathological fracture: Secondary | ICD-10-CM | POA: Diagnosis not present

## 2023-11-22 DIAGNOSIS — G2581 Restless legs syndrome: Secondary | ICD-10-CM | POA: Diagnosis not present

## 2023-11-26 ENCOUNTER — Other Ambulatory Visit: Payer: Self-pay

## 2023-11-26 DIAGNOSIS — C9001 Multiple myeloma in remission: Secondary | ICD-10-CM

## 2023-11-27 ENCOUNTER — Encounter: Payer: Self-pay | Admitting: Oncology

## 2023-11-27 ENCOUNTER — Inpatient Hospital Stay: Attending: Hematology

## 2023-11-27 ENCOUNTER — Inpatient Hospital Stay

## 2023-11-27 VITALS — BP 130/81 | HR 86 | Temp 97.6°F | Resp 18 | Wt 184.6 lb

## 2023-11-27 DIAGNOSIS — C9001 Multiple myeloma in remission: Secondary | ICD-10-CM | POA: Insufficient documentation

## 2023-11-27 DIAGNOSIS — Z5112 Encounter for antineoplastic immunotherapy: Secondary | ICD-10-CM | POA: Diagnosis not present

## 2023-11-27 DIAGNOSIS — C9000 Multiple myeloma not having achieved remission: Secondary | ICD-10-CM

## 2023-11-27 LAB — COMPREHENSIVE METABOLIC PANEL WITH GFR
ALT: 12 U/L (ref 0–44)
AST: 16 U/L (ref 15–41)
Albumin: 4.2 g/dL (ref 3.5–5.0)
Alkaline Phosphatase: 93 U/L (ref 38–126)
Anion gap: 14 (ref 5–15)
BUN: 21 mg/dL (ref 8–23)
CO2: 27 mmol/L (ref 22–32)
Calcium: 9.1 mg/dL (ref 8.9–10.3)
Chloride: 98 mmol/L (ref 98–111)
Creatinine, Ser: 1.5 mg/dL — ABNORMAL HIGH (ref 0.61–1.24)
GFR, Estimated: 47 mL/min — ABNORMAL LOW (ref >60)
Glucose, Bld: 176 mg/dL — ABNORMAL HIGH (ref 70–99)
Potassium: 3.5 mmol/L (ref 3.5–5.1)
Sodium: 139 mmol/L (ref 135–145)
Total Bilirubin: 0.7 mg/dL (ref 0.0–1.2)
Total Protein: 7 g/dL (ref 6.5–8.1)

## 2023-11-27 LAB — CBC WITH DIFFERENTIAL/PLATELET
Abs Immature Granulocytes: 0.05 K/uL (ref 0.00–0.07)
Basophils Absolute: 0.1 K/uL (ref 0.0–0.1)
Basophils Relative: 1 %
Eosinophils Absolute: 0.8 K/uL — ABNORMAL HIGH (ref 0.0–0.5)
Eosinophils Relative: 7 %
HCT: 41.5 % (ref 39.0–52.0)
Hemoglobin: 14.9 g/dL (ref 13.0–17.0)
Immature Granulocytes: 0 %
Lymphocytes Relative: 18 %
Lymphs Abs: 2.1 K/uL (ref 0.7–4.0)
MCH: 33.9 pg (ref 26.0–34.0)
MCHC: 35.9 g/dL (ref 30.0–36.0)
MCV: 94.5 fL (ref 80.0–100.0)
Monocytes Absolute: 0.9 K/uL (ref 0.1–1.0)
Monocytes Relative: 8 %
Neutro Abs: 7.8 K/uL — ABNORMAL HIGH (ref 1.7–7.7)
Neutrophils Relative %: 66 %
Platelets: 262 K/uL (ref 150–400)
RBC: 4.39 MIL/uL (ref 4.22–5.81)
RDW: 12.6 % (ref 11.5–15.5)
WBC: 11.7 K/uL — ABNORMAL HIGH (ref 4.0–10.5)
nRBC: 0 % (ref 0.0–0.2)

## 2023-11-27 LAB — MAGNESIUM: Magnesium: 2 mg/dL (ref 1.7–2.4)

## 2023-11-27 MED ORDER — CETIRIZINE HCL 10 MG PO TABS
10.0000 mg | ORAL_TABLET | Freq: Every day | ORAL | Status: DC
  Administered 2023-11-27: 10 mg via ORAL
  Filled 2023-11-27: qty 1

## 2023-11-27 MED ORDER — SODIUM CHLORIDE 0.9 % IV SOLN: Freq: Once | INTRAVENOUS | Status: AC

## 2023-11-27 MED ORDER — DARATUMUMAB-HYALURONIDASE-FIHJ 1800-30000 MG-UT/15ML ~~LOC~~ SOLN
1800.0000 mg | Freq: Once | SUBCUTANEOUS | Status: AC
  Administered 2023-11-27: 1800 mg via SUBCUTANEOUS
  Filled 2023-11-27: qty 15

## 2023-11-27 MED ORDER — ZOLEDRONIC ACID 4 MG/5ML IV CONC
3.0000 mg | Freq: Once | INTRAVENOUS | Status: AC
  Administered 2023-11-27: 3 mg via INTRAVENOUS
  Filled 2023-11-27: qty 3.75

## 2023-11-27 MED ORDER — DEXAMETHASONE 4 MG PO TABS
20.0000 mg | ORAL_TABLET | Freq: Once | ORAL | Status: AC
  Administered 2023-11-27: 20 mg via ORAL
  Filled 2023-11-27: qty 5

## 2023-11-27 MED ORDER — ACETAMINOPHEN 325 MG PO TABS
650.0000 mg | ORAL_TABLET | Freq: Once | ORAL | Status: AC
  Administered 2023-11-27: 650 mg via ORAL
  Filled 2023-11-27: qty 2

## 2023-11-27 NOTE — Progress Notes (Signed)
 Patient did not take his pre-meds today. Will order as planned.   Pt did not take his decadron  this am as well , will give them here per protocol.    Pt is taking calcium daily, denies any jaw pain today, no major dental work being done.   Will proceed with zometa  today as planned.   Treatment given per orders. Patient tolerated it well without problems. Vitals stable and discharged home from clinic ambulatory. Follow up as scheduled.

## 2023-11-27 NOTE — Patient Instructions (Signed)
 [facility] [place] - [facility]  Discharge Instructions: Thank you for choosing [facility] to provide your oncology and hematology care.  If you have a lab appointment with the Cancer Center - please note that after April 8th, 2024, all labs will be drawn in the cancer center.  You do not have to check in or register with the main entrance as you have in the past but will complete your check-in in the cancer center.  Wear comfortable clothing and clothing appropriate for easy access to any Portacath or PICC line.   We strive to give you quality time with your provider. You may need to reschedule your appointment if you arrive late (15 or more minutes).  Arriving late affects you and other patients whose appointments are after yours.  Also, if you miss three or more appointments without notifying the office, you may be dismissed from the clinic at the provider's discretion.      For prescription refill requests, have your pharmacy contact our office and allow 72 hours for refills to be completed.    Today you received the following chemotherapy and/or immunotherapy agents Darzalex Fas[place].       To help prevent nausea and vomiting after your treatment, we encourage you to take your nausea medication as directed.  BELOW ARE SYMPTOMS THAT SHOULD BE REPORTED IMMEDIATELY: *FEVER GREATER THAN 100.4 F (38 C) OR HIGHER *CHILLS OR SWEATING *NAUSEA AND VOMITING THAT IS NOT CONTROLLED WITH YOUR NAUSEA MEDICATION *UNUSUAL SHORTNESS OF BREATH *UNUSUAL BRUISING OR BLEEDING *URINARY PROBLEMS (pain or burning when urinating, or frequent urination) *BOWEL PROBLEMS (unusual diarrhea, constipation, pain near the anus) TENDERNESS IN MOUTH AND THROAT WITH OR WITHOUT PRESENCE OF ULCERS (sore throat, sores in mouth, or a toothache) UNUSUAL RASH, SWELLING OR PAIN  UNUSUAL VAGINAL DISCHARGE OR ITCHING   Items with * indicate a potential emergency and should be  followed up as soon as possible or go to the Emergency Department if any problems should occur.  Please show the CHEMOTHERAPY ALERT CARD or IMMUNOTHERAPY ALERT CARD at check-in to the Emergency Department and triage nurse.  Should you have questions after your visit or need to cancel or reschedule your appointment, please contact [facility] [place] - A DEPT OF [facility]igh[facility] Bridegroom [facility] [phone]  and follow the prompts.  Office hours are 8:00 a.m. to 4:30 p.m. Monday - Friday. Please note that voicemails left after 4:00 p.m. may not be returned until the following business day.  We are closed weekends and major holidays. You have access to a nurse at all times for urgent questions. Please call the main number to the clinic [phone] and follow the prompts.  For any non-urgent questions, you may also contact your provider using MyChart. We now offer e-Visits for anyone 20 and older to request care online for non-urgent symptoms. For details visit [email or web address].   Also download the MyChart app! Go to the app store, search "MyChart", open the app, select [place], and log in with your MyChart username and password.

## 2023-11-28 LAB — KAPPA/LAMBDA LIGHT CHAINS
Kappa free light chain: 13 mg/L (ref 3.3–19.4)
Kappa, lambda light chain ratio: 1.49 (ref 0.26–1.65)
Lambda free light chains: 8.7 mg/L (ref 5.7–26.3)

## 2023-11-29 LAB — PROTEIN ELECTROPHORESIS, SERUM
A/G Ratio: 1.5 (ref 0.7–1.7)
Albumin ELP: 3.8 g/dL (ref 2.9–4.4)
Alpha-1-Globulin: 0.2 g/dL (ref 0.0–0.4)
Alpha-2-Globulin: 0.9 g/dL (ref 0.4–1.0)
Beta Globulin: 0.9 g/dL (ref 0.7–1.3)
Gamma Globulin: 0.5 g/dL (ref 0.4–1.8)
Globulin, Total: 2.5 g/dL (ref 2.2–3.9)
Total Protein ELP: 6.3 g/dL (ref 6.0–8.5)

## 2023-12-02 DIAGNOSIS — E782 Mixed hyperlipidemia: Secondary | ICD-10-CM | POA: Diagnosis not present

## 2023-12-02 DIAGNOSIS — I10 Essential (primary) hypertension: Secondary | ICD-10-CM | POA: Diagnosis not present

## 2023-12-02 DIAGNOSIS — K219 Gastro-esophageal reflux disease without esophagitis: Secondary | ICD-10-CM | POA: Diagnosis not present

## 2023-12-02 DIAGNOSIS — E1140 Type 2 diabetes mellitus with diabetic neuropathy, unspecified: Secondary | ICD-10-CM | POA: Diagnosis not present

## 2023-12-10 DIAGNOSIS — M810 Age-related osteoporosis without current pathological fracture: Secondary | ICD-10-CM | POA: Diagnosis not present

## 2023-12-10 DIAGNOSIS — E538 Deficiency of other specified B group vitamins: Secondary | ICD-10-CM | POA: Diagnosis not present

## 2023-12-10 DIAGNOSIS — E1122 Type 2 diabetes mellitus with diabetic chronic kidney disease: Secondary | ICD-10-CM | POA: Diagnosis not present

## 2023-12-10 DIAGNOSIS — E782 Mixed hyperlipidemia: Secondary | ICD-10-CM | POA: Diagnosis not present

## 2023-12-11 DIAGNOSIS — M5432 Sciatica, left side: Secondary | ICD-10-CM | POA: Diagnosis not present

## 2023-12-11 DIAGNOSIS — M9903 Segmental and somatic dysfunction of lumbar region: Secondary | ICD-10-CM | POA: Diagnosis not present

## 2023-12-11 DIAGNOSIS — M5431 Sciatica, right side: Secondary | ICD-10-CM | POA: Diagnosis not present

## 2023-12-17 DIAGNOSIS — I129 Hypertensive chronic kidney disease with stage 1 through stage 4 chronic kidney disease, or unspecified chronic kidney disease: Secondary | ICD-10-CM | POA: Diagnosis not present

## 2023-12-17 DIAGNOSIS — C439 Malignant melanoma of skin, unspecified: Secondary | ICD-10-CM | POA: Diagnosis not present

## 2023-12-17 DIAGNOSIS — G720 Drug-induced myopathy: Secondary | ICD-10-CM | POA: Diagnosis not present

## 2023-12-17 DIAGNOSIS — N1831 Chronic kidney disease, stage 3a: Secondary | ICD-10-CM | POA: Diagnosis not present

## 2023-12-17 DIAGNOSIS — C9000 Multiple myeloma not having achieved remission: Secondary | ICD-10-CM | POA: Diagnosis not present

## 2023-12-17 DIAGNOSIS — K219 Gastro-esophageal reflux disease without esophagitis: Secondary | ICD-10-CM | POA: Diagnosis not present

## 2023-12-17 DIAGNOSIS — E1122 Type 2 diabetes mellitus with diabetic chronic kidney disease: Secondary | ICD-10-CM | POA: Diagnosis not present

## 2023-12-17 DIAGNOSIS — D631 Anemia in chronic kidney disease: Secondary | ICD-10-CM | POA: Diagnosis not present

## 2023-12-17 DIAGNOSIS — G3184 Mild cognitive impairment, so stated: Secondary | ICD-10-CM | POA: Diagnosis not present

## 2023-12-17 DIAGNOSIS — Z23 Encounter for immunization: Secondary | ICD-10-CM | POA: Diagnosis not present

## 2023-12-17 DIAGNOSIS — E782 Mixed hyperlipidemia: Secondary | ICD-10-CM | POA: Diagnosis not present

## 2023-12-17 DIAGNOSIS — E1140 Type 2 diabetes mellitus with diabetic neuropathy, unspecified: Secondary | ICD-10-CM | POA: Diagnosis not present

## 2023-12-18 DIAGNOSIS — M5432 Sciatica, left side: Secondary | ICD-10-CM | POA: Diagnosis not present

## 2023-12-18 DIAGNOSIS — M5431 Sciatica, right side: Secondary | ICD-10-CM | POA: Diagnosis not present

## 2023-12-18 DIAGNOSIS — M9903 Segmental and somatic dysfunction of lumbar region: Secondary | ICD-10-CM | POA: Diagnosis not present

## 2023-12-20 DIAGNOSIS — C9000 Multiple myeloma not having achieved remission: Secondary | ICD-10-CM | POA: Diagnosis not present

## 2023-12-20 DIAGNOSIS — N1831 Chronic kidney disease, stage 3a: Secondary | ICD-10-CM | POA: Diagnosis not present

## 2023-12-20 DIAGNOSIS — D649 Anemia, unspecified: Secondary | ICD-10-CM | POA: Diagnosis not present

## 2023-12-20 DIAGNOSIS — I10 Essential (primary) hypertension: Secondary | ICD-10-CM | POA: Diagnosis not present

## 2023-12-20 DIAGNOSIS — E1122 Type 2 diabetes mellitus with diabetic chronic kidney disease: Secondary | ICD-10-CM | POA: Diagnosis not present

## 2023-12-20 DIAGNOSIS — K219 Gastro-esophageal reflux disease without esophagitis: Secondary | ICD-10-CM | POA: Diagnosis not present

## 2023-12-20 DIAGNOSIS — E1140 Type 2 diabetes mellitus with diabetic neuropathy, unspecified: Secondary | ICD-10-CM | POA: Diagnosis not present

## 2023-12-20 DIAGNOSIS — E538 Deficiency of other specified B group vitamins: Secondary | ICD-10-CM | POA: Diagnosis not present

## 2023-12-20 DIAGNOSIS — E559 Vitamin D deficiency, unspecified: Secondary | ICD-10-CM | POA: Diagnosis not present

## 2023-12-20 DIAGNOSIS — E782 Mixed hyperlipidemia: Secondary | ICD-10-CM | POA: Diagnosis not present

## 2023-12-20 DIAGNOSIS — M810 Age-related osteoporosis without current pathological fracture: Secondary | ICD-10-CM | POA: Diagnosis not present

## 2023-12-20 NOTE — Progress Notes (Signed)
 Brooke from [employer] called to make office aware that patients medication atenolol chlorthalidone was changed to just plain atenolol 50mg  and that patient went to see a chiropractor for pain in spine from bone spur after being advised not to and now patient is having dizzy spells and has fell twice since seeing chiropractor. Dr. Davonna made aware and patient was added on to schedule for in office app on day of infusion next week. Pt made aware of app.

## 2023-12-23 DIAGNOSIS — M5431 Sciatica, right side: Secondary | ICD-10-CM | POA: Diagnosis not present

## 2023-12-23 DIAGNOSIS — M5432 Sciatica, left side: Secondary | ICD-10-CM | POA: Diagnosis not present

## 2023-12-23 DIAGNOSIS — M9903 Segmental and somatic dysfunction of lumbar region: Secondary | ICD-10-CM | POA: Diagnosis not present

## 2023-12-25 ENCOUNTER — Inpatient Hospital Stay: Attending: Hematology

## 2023-12-25 ENCOUNTER — Encounter: Payer: Self-pay | Admitting: Oncology

## 2023-12-25 ENCOUNTER — Inpatient Hospital Stay (HOSPITAL_BASED_OUTPATIENT_CLINIC_OR_DEPARTMENT_OTHER): Admitting: Oncology

## 2023-12-25 ENCOUNTER — Inpatient Hospital Stay

## 2023-12-25 VITALS — BP 132/83 | HR 72 | Temp 96.9°F | Resp 20 | Wt 192.5 lb

## 2023-12-25 DIAGNOSIS — C9000 Multiple myeloma not having achieved remission: Secondary | ICD-10-CM

## 2023-12-25 DIAGNOSIS — M810 Age-related osteoporosis without current pathological fracture: Secondary | ICD-10-CM

## 2023-12-25 DIAGNOSIS — C9001 Multiple myeloma in remission: Secondary | ICD-10-CM | POA: Insufficient documentation

## 2023-12-25 DIAGNOSIS — R7989 Other specified abnormal findings of blood chemistry: Secondary | ICD-10-CM | POA: Diagnosis not present

## 2023-12-25 DIAGNOSIS — Z5112 Encounter for antineoplastic immunotherapy: Secondary | ICD-10-CM | POA: Insufficient documentation

## 2023-12-25 LAB — CBC WITH DIFFERENTIAL/PLATELET
Abs Immature Granulocytes: 0.05 K/uL (ref 0.00–0.07)
Basophils Absolute: 0.1 K/uL (ref 0.0–0.1)
Basophils Relative: 1 %
Eosinophils Absolute: 0.7 K/uL — ABNORMAL HIGH (ref 0.0–0.5)
Eosinophils Relative: 7 %
HCT: 39 % (ref 39.0–52.0)
Hemoglobin: 13.8 g/dL (ref 13.0–17.0)
Immature Granulocytes: 1 %
Lymphocytes Relative: 14 %
Lymphs Abs: 1.5 K/uL (ref 0.7–4.0)
MCH: 33.5 pg (ref 26.0–34.0)
MCHC: 35.4 g/dL (ref 30.0–36.0)
MCV: 94.7 fL (ref 80.0–100.0)
Monocytes Absolute: 0.6 K/uL (ref 0.1–1.0)
Monocytes Relative: 5 %
Neutro Abs: 7.9 K/uL — ABNORMAL HIGH (ref 1.7–7.7)
Neutrophils Relative %: 72 %
Platelets: 235 K/uL (ref 150–400)
RBC: 4.12 MIL/uL — ABNORMAL LOW (ref 4.22–5.81)
RDW: 12.5 % (ref 11.5–15.5)
WBC: 10.8 K/uL — ABNORMAL HIGH (ref 4.0–10.5)
nRBC: 0 % (ref 0.0–0.2)

## 2023-12-25 LAB — CMP (CANCER CENTER ONLY)
ALT: 14 U/L (ref 0–44)
AST: 16 U/L (ref 15–41)
Albumin: 4 g/dL (ref 3.5–5.0)
Alkaline Phosphatase: 86 U/L (ref 38–126)
Anion gap: 13 (ref 5–15)
BUN: 23 mg/dL (ref 8–23)
CO2: 27 mmol/L (ref 22–32)
Calcium: 9.3 mg/dL (ref 8.9–10.3)
Chloride: 99 mmol/L (ref 98–111)
Creatinine: 1.58 mg/dL — ABNORMAL HIGH (ref 0.61–1.24)
GFR, Estimated: 44 mL/min — ABNORMAL LOW (ref >60)
Glucose, Bld: 215 mg/dL — ABNORMAL HIGH (ref 70–99)
Potassium: 4.1 mmol/L (ref 3.5–5.1)
Sodium: 139 mmol/L (ref 135–145)
Total Bilirubin: 1 mg/dL (ref 0.0–1.2)
Total Protein: 6.6 g/dL (ref 6.5–8.1)

## 2023-12-25 LAB — MAGNESIUM: Magnesium: 1.7 mg/dL (ref 1.7–2.4)

## 2023-12-25 MED ORDER — SODIUM CHLORIDE 0.9 % IV SOLN: Freq: Once | INTRAVENOUS | Status: AC

## 2023-12-25 MED ORDER — INSULIN ASPART 100 UNIT/ML IJ SOLN: 10.0000 [IU] | Freq: Once | INTRAMUSCULAR | Status: DC

## 2023-12-25 MED ORDER — ZOLEDRONIC ACID 4 MG/5ML IV CONC
3.0000 mg | Freq: Once | INTRAVENOUS | Status: AC
  Administered 2023-12-25: 3 mg via INTRAVENOUS
  Filled 2023-12-25: qty 3.75

## 2023-12-25 MED ORDER — DARATUMUMAB-HYALURONIDASE-FIHJ 1800-30000 MG-UT/15ML ~~LOC~~ SOLN
1800.0000 mg | Freq: Once | SUBCUTANEOUS | Status: AC
  Administered 2023-12-25: 1800 mg via SUBCUTANEOUS
  Filled 2023-12-25: qty 15

## 2023-12-25 NOTE — Progress Notes (Signed)
 [facility] OFFICE PROGRESS NOTE  Shona Norleen PEDLAR, MD  ASSESSMENT & PLAN:    Assessment & Plan Multiple myeloma in remission [facility]) Patient has intermediate risk multiple myeloma diagnosed in 01/2022 s/p Dara VRD.  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 Developed severe rash with Revlimid  and is currently off of it since 11/23/2022 Continuing single agent daratumumab  every month Reviewed Labs today: No M spike, normal serum free light chains with a normal ratio - Continue daratumumab  monthly.  Continue acyclovir  prophylaxis.  Return to clinic in 12 weeks with labs. Age related osteoporosis, unspecified pathological fracture presence DEXA 09/11/2022 with a T-score of -3.5 Monthly Zometa  started on 01/30/2023 History of multiple fractures including recent metatarsal fracture diagnosed on 11/08/2022. Current calcium: 9.3  - Continue calcium supplements.  Continue monthly Zometa . Elevated serum creatinine Patient has had persistently elevated creatinine since February 2025.  Most recent creatinine is 1.58. Reports being put on a blood pressure medicine that helped his kidney numbers and now he was taking off of it due to falls. We discussed he likely needs to see a nephrologist and he was in agreement.  Referral to Dr. Rachele sent over.   Orders Placed This Encounter  Procedures   CMP (Cancer Center only)    Standing Status:   Future    Expected Date:   03/25/2024    Expiration Date:   06/23/2024   Protein electrophoresis, serum    Standing Status:   Future    Expected Date:   03/25/2024    Expiration Date:   06/23/2024   Kappa/lambda light chains    Standing Status:   Future    Expected Date:   03/25/2024    Expiration Date:   06/23/2024   Magnesium     Standing Status:   Future    Expected Date:   03/25/2024    Expiration Date:   06/23/2024   CBC with Differential    Standing Status:   Future    Expected Date:   03/25/2024    Expiration  Date:   06/23/2024    INTERVAL HISTORY: Patient returns for follow-up.  Presents today with his wife.  Reports he has had 2 falls over the past month or so due to low blood pressures.  Most recently, his blood pressure medicine was switched from atenolol chlorthalidone to atenolol 50 mg due to increased dizziness and falls following a chiropractor appointment.  Reports pain in his spine which she had adjusted which has now put off his blood pressure.  Reports bilateral ankle swelling as well.  Reports Dr. Katragadda was initially the 1 to adjust his BP medication and now they are nervous to switch because it did improve his kidney function.  He is not currently followed by nephrology.  He continues to tolerate daratumumab  monthly without issue.  He has chronic cough, numbness and tingling, dizziness at times, palpitations and insomnia.  Appetite is 100% energy levels are 75%.  We reviewed CBC, CMP, protein electrophoresis, magnesium , kappa lambda light chains.  SUMMARY OF HEMATOLOGIC HISTORY: Oncology History  Multiple myeloma (HCC)  06/06/2020 Pathology Results   DIAGNOSIS:   BONE MARROW, ASPIRATE, CLOT, CORE:  - Plasma cell myeloma.   PERIPHERAL BLOOD:  - Essentially unremarkable.   Lymphocytes/plasma cells: Plasma cells are increased in numbers (14% by manual differential counts) with atypical forms (large forms, prominent nucleoli). Lymphocytes are not increased.   FISH RESULTS:   Results: ABNORMAL   Interpretation:   Del(1p):  Not Detected  Dup(1q):       Not Detected  Del(13q)/-13:       Detected (monosomy 13)  Del(17p):      Not Detected  t(4;14):            Not Detected  t(11;14):      Not Detected  t(14;16):      Not Detected  t(14;20):      Not Detected   CYTOGENETIC RESULTS:   Karyotype: 46,XY[20]     02/01/2022 Pathology Results   DIAGNOSIS:   BONE MARROW, ASPIRATE, CLOT, CORE:  -Hypercellular bone marrow with plasma cell neoplasm  -See comment    PERIPHERAL BLOOD:  -Macrocytic anemia  -Eosinophilia   The bone marrow is hypercellular for age with increased number of plasma cells representing 35% of all cells in the aspirate associated with interstitial infiltrates and numerous variably sized clusters in the clot and biopsy sections.  The plasma cells display kappa light chain restriction consistent with plasma cell neoplasm.   FISH: Del(1p):      Not Detected  Dup(1q):       Not Detected  Del(13q)/-13:       Detected (monosomy 13)  Del(17p):      Not Detected  t(4;14):            Not Detected  t(11;14):      Not Detected  t(14;16):      Not Detected  t(14;20):      Not Detected   CYTOGENETIC RESULTS:   Karyotype: 46,XY[20] Normal male    04/05/2022 PET scan   IMPRESSION: 1. No abnormal foci of increased uptake within the skeleton to suggest metabolically active multiple myeloma.  2. No suspicious bone lesions identified on the CT portion of the exam.     04/11/2022 Initial Diagnosis   Multiple myeloma (HCC)   04/11/2022 Cancer Staging   Staging form: Plasma Cell Myeloma and Plasma Cell Disorders, AJCC 8th Edition - Clinical stage from 04/11/2022: RISS Stage II (Beta-2 -microglobulin (mg/L): 4.1, Albumin (g/dL): 2.7, ISS: Stage II, High-risk cytogenetics: Absent, LDH: Normal) - Signed by Rogers Hai, MD on 04/11/2022 Histopathologic type: Multiple myeloma Beta 2 microglobulin range (mg/L): 3.5 to 5.49 Albumin range (g/dL): Less than 3.5 Cytogenetics: No abnormalities   05/01/2022 -  Chemotherapy   Patient is on Treatment Plan : MYELOMA  Daratumumab  SQ + Lenalidomide  + Dexamethasone  (DaraRd) q28d        CBC    Component Value Date/Time   WBC 10.8 (H) 12/25/2023 1046   RBC 4.12 (L) 12/25/2023 1046   HGB 13.8 12/25/2023 1046   HCT 39.0 12/25/2023 1046   PLT 235 12/25/2023 1046   MCV 94.7 12/25/2023 1046   MCH 33.5 12/25/2023 1046   MCHC 35.4 12/25/2023 1046   RDW 12.5 12/25/2023 1046   LYMPHSABS 1.5  12/25/2023 1046   MONOABS 0.6 12/25/2023 1046   EOSABS 0.7 (H) 12/25/2023 1046   BASOSABS 0.1 12/25/2023 1046       Latest Ref Rng & Units 12/25/2023   10:46 AM 11/27/2023   10:50 AM 10/23/2023    1:12 PM  CMP  Glucose 70 - 99 mg/dL 784  823  841   BUN 8 - 23 mg/dL 23  21  30    Creatinine 0.61 - 1.24 mg/dL 8.41  8.49  8.15   Sodium 135 - 145 mmol/L 139  139  138   Potassium 3.5 - 5.1 mmol/L 4.1  3.5  3.6   Chloride 98 - 111  mmol/L 99  98  98   CO2 22 - 32 mmol/L 27  27  26    Calcium 8.9 - 10.3 mg/dL 9.3  9.1  89.9   Total Protein 6.5 - 8.1 g/dL 6.6  7.0  6.8   Total Bilirubin 0.0 - 1.2 mg/dL 1.0  0.7  0.9   Alkaline Phos 38 - 126 U/L 86  93  82   AST 15 - 41 U/L 16  16  14    ALT 0 - 44 U/L 14  12  11       Lab Results  Component Value Date   VITAMINB12 122 (L) 04/05/2022    Vitals:   12/25/23 1133  BP: 132/83  Pulse: 72  Resp: 20  Temp: (!) 96.9 F (36.1 C)  SpO2: 97%    Review of System:  Review of Systems  Constitutional:  Positive for malaise/fatigue.  Respiratory:  Positive for cough.   Cardiovascular:  Positive for palpitations.  Musculoskeletal:  Positive for falls.  Neurological:  Positive for dizziness and tingling.  Psychiatric/Behavioral:  The patient has insomnia.     Physical Exam: Physical Exam Constitutional:      Appearance: Normal appearance.  HENT:     Head: Normocephalic and atraumatic.  Eyes:     Pupils: Pupils are equal, round, and reactive to light.  Cardiovascular:     Rate and Rhythm: Normal rate and regular rhythm.     Heart sounds: Normal heart sounds. No murmur heard. Pulmonary:     Effort: Pulmonary effort is normal.     Breath sounds: Normal breath sounds. No wheezing.  Abdominal:     General: Bowel sounds are normal. There is no distension.     Palpations: Abdomen is soft.     Tenderness: There is no abdominal tenderness.  Musculoskeletal:        General: Normal range of motion.     Cervical back: Normal range of  motion.  Skin:    General: Skin is warm and dry.     Findings: No rash.  Neurological:     Mental Status: He is alert and oriented to person, place, and time.     Gait: Gait is intact.  Psychiatric:        Mood and Affect: Mood and affect normal.        Cognition and Memory: Memory normal.        Judgment: Judgment normal.      I spent 25 minutes dedicated to the care of this patient (face-to-face and non-face-to-face) on the date of the encounter to include what is described in the assessment and plan.,  Delon Hope, NP 12/29/2023 12:14 PM

## 2023-12-25 NOTE — Assessment & Plan Note (Deleted)
 Patient has intermediate risk multiple myeloma diagnosed in 01/2022 s/p Dara VRD.  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 Developed severe rash with Revlimid  and is currently off of it since 11/23/2022 Continuing single agent daratumumab  every month Reviewed Labs today: No M spike, normal serum free light chains with a normal ratio  - Continue daratumumab  monthly.  Continue acyclovir  prophylaxis.  Return to clinic in 12 weeks with labs.

## 2023-12-25 NOTE — Patient Instructions (Signed)
 [facility] - A DEPT OF [place]  Discharge Instructions: Thank you for choosing [facility] to provide your oncology and hematology care.  If you have a lab appointment with the Cancer Center - please note that after April 8th, 2024, all labs will be drawn in the cancer center.  You do not have to check in or register with the main entrance as you have in the past but will complete your check-in in the cancer center.  Wear comfortable clothing and clothing appropriate for easy access to any Portacath or PICC line.   We strive to give you quality time with your provider. You may need to reschedule your appointment if you arrive late (15 or more minutes).  Arriving late affects you and other patients whose appointments are after yours.  Also, if you miss three or more appointments without notifying the office, you may be dismissed from the clinic at the provider's discretion.      For prescription refill requests, have your pharmacy contact our office and allow 72 hours for refills to be completed.    Today you received the following chemotherapy and/or immunotherapy agents Darzalex  Faspro and Zometa  3mg        To help prevent nausea and vomiting after your treatment, we encourage you to take your nausea medication as directed.  Daratumumab ; Hyaluronidase  Injection What is this medication? DARATUMUMAB ; HYALURONIDASE  (dar a toom ue mab; hye al ur ON i dase) treats multiple myeloma, a type of bone marrow cancer. Daratumumab  works by blocking a protein that causes cancer cells to grow and multiply. This helps to slow or stop the spread of cancer cells. Hyaluronidase  works by increasing the absorption of other medications in the body to help them work better. This medication may also be used treat amyloidosis, a condition that causes the buildup of a protein (amyloid) in your body. It works by reducing the buildup of this protein, which decreases symptoms.  It is a combination medication that contains a monoclonal antibody. This medicine may be used for other purposes; ask your health care provider or pharmacist if you have questions. COMMON BRAND NAME(S): DARZALEX  FASPRO What should I tell my care team before I take this medication? They need to know if you have any of these conditions: Heart disease Infection, such as chickenpox, cold sores, herpes, hepatitis B Lung or breathing disease An unusual or allergic reaction to daratumumab , hyaluronidase , other medications, foods, dyes, or preservatives Pregnant or trying to get pregnant Breast-feeding How should I use this medication? This medication is injected under the skin. It is given by your care team in a hospital or clinic setting. Talk to your care team about the use of this medication in children. Special care may be needed. Overdosage: If you think you have taken too much of this medicine contact a poison control center or emergency room at once. NOTE: This medicine is only for you. Do not share this medicine with others. What if I miss a dose? Keep appointments for follow-up doses. It is important not to miss your dose. Call your care team if you are unable to keep an appointment. What may interact with this medication? Interactions have not been studied. This list may not describe all possible interactions. Give your health care provider a list of all the medicines, herbs, non-prescription drugs, or dietary supplements you use. Also tell them if you smoke, drink alcohol, or use illegal drugs. Some items may interact with  your medicine. What should I watch for while using this medication? Your condition will be monitored carefully while you are receiving this medication. This medication can cause serious allergic reactions. To reduce your risk, your care team may give you other medication to take before receiving this one. Be sure to follow the directions from your care team. This  medication can affect the results of blood tests to match your blood type. These changes can last for up to 6 months after the final dose. Your care team will do blood tests to match your blood type before you start treatment. Tell all of your care team that you are being treated with this medication before receiving a blood transfusion. This medication can affect the results of some tests used to determine treatment response; extra tests may be needed to evaluate response. Talk to your care team if you wish to become pregnant or think you are pregnant. This medication can cause serious birth defects if taken during pregnancy and for 3 months after the last dose. A reliable form of contraception is recommended while taking this medication and for 3 months after the last dose. Talk to your care team about effective forms of contraception. Do not breast-feed while taking this medication. What side effects may I notice from receiving this medication? Side effects that you should report to your care team as soon as possible: Allergic reactions--skin rash, itching, hives, swelling of the face, lips, tongue, or throat Heart rhythm changes--fast or irregular heartbeat, dizziness, feeling faint or lightheaded, chest pain, trouble breathing Infection--fever, chills, cough, sore throat, wounds that don't heal, pain or trouble when passing urine, general feeling of discomfort or being unwell Infusion reactions--chest pain, shortness of breath or trouble breathing, feeling faint or lightheaded Sudden eye pain or change in vision such as blurry vision, seeing halos around lights, vision loss Unusual bruising or bleeding Side effects that usually do not require medical attention (report to your care team if they continue or are bothersome): Constipation Diarrhea Fatigue Nausea Pain, tingling, or numbness in the hands or feet Swelling of the ankles, hands, or feet This list may not describe all possible side  effects. Call your doctor for medical advice about side effects. You may report side effects to FDA at 1-800-FDA-1088. Where should I keep my medication? This medication is given in a hospital or clinic. It will not be stored at home. NOTE: This sheet is a summary. It may not cover all possible information. If you have questions about this medicine, talk to your doctor, pharmacist, or health care provider.  2024 Elsevier/Gold Standard (2021-07-25 00:00:00)  BELOW ARE SYMPTOMS THAT SHOULD BE REPORTED IMMEDIATELY: *FEVER GREATER THAN 100.4 F (38 C) OR HIGHER *CHILLS OR SWEATING *NAUSEA AND VOMITING THAT IS NOT CONTROLLED WITH YOUR NAUSEA MEDICATION *UNUSUAL SHORTNESS OF BREATH *UNUSUAL BRUISING OR BLEEDING *URINARY PROBLEMS (pain or burning when urinating, or frequent urination) *BOWEL PROBLEMS (unusual diarrhea, constipation, pain near the anus) TENDERNESS IN MOUTH AND THROAT WITH OR WITHOUT PRESENCE OF ULCERS (sore throat, sores in mouth, or a toothache) UNUSUAL RASH, SWELLING OR PAIN  UNUSUAL VAGINAL DISCHARGE OR ITCHING   Items with * indicate a potential emergency and should be followed up as soon as possible or go to the Emergency Department if any problems should occur.  Please show the CHEMOTHERAPY ALERT CARD or IMMUNOTHERAPY ALERT CARD at check-in to the Emergency Department and triage nurse.  Should you have questions after your visit or need to cancel or reschedule your  appointment, please contact [facility] [place] - A DEPT OF [facility] [phone]  and follow the prompts.  Office hours are 8:00 a.m. to 4:30 p.m. Monday - Friday. Please note that voicemails left after 4:00 p.m. may not be returned until the following business day.  We are closed weekends and major holidays. You have access to a nurse at all times for urgent questions. Please call the main number to the clinic [phone] and follow the prompts.  For any non-urgent questions, you may  also contact your provider using MyChart. We now offer e-Visits for anyone 96 and older to request care online for non-urgent symptoms. For details visit [email or web address].   Also download the MyChart app! Go to the app store, search MyChart, open the app, select [place], and log in with your MyChart username and password.

## 2023-12-25 NOTE — Assessment & Plan Note (Addendum)
 DEXA 09/11/2022 with a T-score of -3.5 Monthly Zometa  started on 01/30/2023 History of multiple fractures including recent metatarsal fracture diagnosed on 11/08/2022. Current calcium: 9.3  - Continue calcium supplements.  Continue monthly Zometa .

## 2023-12-25 NOTE — Progress Notes (Signed)
 Patient presents today for Darzalex  Faspro and Zometa  infusion. Patient is in satisfactory condition with no new complaints voiced.  Vital signs are stable.  Labs reviewed by Delon Hope, NP during the office visit and all labs are within treatment parameters.  We will proceed with treatment per MD orders.   Patient took pre-meds at home prior to arrival.  Treatment given today per MD orders. Tolerated infusion without adverse affects. Vital signs stable. No complaints at this time. Discharged from clinic ambulatory in stable condition. Alert and oriented x 3. F/U with [facility] as scheduled.

## 2023-12-25 NOTE — Progress Notes (Signed)
 Glucose 215. Standing orders followed. Patient will receive 10 units of Novolog  [place]. Patient refused Novolog . Patient took pre-medications prior to arrival. Patient will receive Zometa  3 mgs IV today. Patient taking a calcium tablet at home daily. Patient denies any jaw pain or upcoming major dental work. Creatinine 1.58.

## 2023-12-25 NOTE — Assessment & Plan Note (Addendum)
 Patient has intermediate risk multiple myeloma diagnosed in 01/2022 s/p Dara VRD.  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 Developed severe rash with Revlimid  and is currently off of it since 11/23/2022 Continuing single agent daratumumab  every month Reviewed Labs today: No M spike, normal serum free light chains with a normal ratio  - Continue daratumumab  monthly.  Continue acyclovir  prophylaxis.  Return to clinic in 12 weeks with labs.

## 2023-12-26 ENCOUNTER — Other Ambulatory Visit: Payer: Self-pay

## 2023-12-26 LAB — KAPPA/LAMBDA LIGHT CHAINS
Kappa free light chain: 14.8 mg/L (ref 3.3–19.4)
Kappa, lambda light chain ratio: 1.56 (ref 0.26–1.65)
Lambda free light chains: 9.5 mg/L (ref 5.7–26.3)

## 2023-12-27 LAB — PROTEIN ELECTROPHORESIS, SERUM
A/G Ratio: 1.4 (ref 0.7–1.7)
Albumin ELP: 3.7 g/dL (ref 2.9–4.4)
Alpha-1-Globulin: 0.2 g/dL (ref 0.0–0.4)
Alpha-2-Globulin: 0.9 g/dL (ref 0.4–1.0)
Beta Globulin: 1 g/dL (ref 0.7–1.3)
Gamma Globulin: 0.5 g/dL (ref 0.4–1.8)
Globulin, Total: 2.6 g/dL (ref 2.2–3.9)
Total Protein ELP: 6.3 g/dL (ref 6.0–8.5)

## 2023-12-29 ENCOUNTER — Encounter: Payer: Self-pay | Admitting: Oncology

## 2023-12-30 DIAGNOSIS — M5431 Sciatica, right side: Secondary | ICD-10-CM | POA: Diagnosis not present

## 2023-12-30 DIAGNOSIS — M9903 Segmental and somatic dysfunction of lumbar region: Secondary | ICD-10-CM | POA: Diagnosis not present

## 2023-12-30 DIAGNOSIS — M5432 Sciatica, left side: Secondary | ICD-10-CM | POA: Diagnosis not present

## 2024-01-02 DIAGNOSIS — E1122 Type 2 diabetes mellitus with diabetic chronic kidney disease: Secondary | ICD-10-CM | POA: Diagnosis not present

## 2024-01-02 DIAGNOSIS — R809 Proteinuria, unspecified: Secondary | ICD-10-CM | POA: Diagnosis not present

## 2024-01-02 DIAGNOSIS — N1832 Chronic kidney disease, stage 3b: Secondary | ICD-10-CM | POA: Diagnosis not present

## 2024-01-02 DIAGNOSIS — C9001 Multiple myeloma in remission: Secondary | ICD-10-CM | POA: Diagnosis not present

## 2024-01-06 DIAGNOSIS — M5432 Sciatica, left side: Secondary | ICD-10-CM | POA: Diagnosis not present

## 2024-01-06 DIAGNOSIS — M5431 Sciatica, right side: Secondary | ICD-10-CM | POA: Diagnosis not present

## 2024-01-06 DIAGNOSIS — M9903 Segmental and somatic dysfunction of lumbar region: Secondary | ICD-10-CM | POA: Diagnosis not present

## 2024-01-07 DIAGNOSIS — J00 Acute nasopharyngitis [common cold]: Secondary | ICD-10-CM | POA: Diagnosis not present

## 2024-01-09 DIAGNOSIS — I129 Hypertensive chronic kidney disease with stage 1 through stage 4 chronic kidney disease, or unspecified chronic kidney disease: Secondary | ICD-10-CM | POA: Diagnosis not present

## 2024-01-09 DIAGNOSIS — K219 Gastro-esophageal reflux disease without esophagitis: Secondary | ICD-10-CM | POA: Diagnosis not present

## 2024-01-09 DIAGNOSIS — N1831 Chronic kidney disease, stage 3a: Secondary | ICD-10-CM | POA: Diagnosis not present

## 2024-01-09 DIAGNOSIS — G3184 Mild cognitive impairment, so stated: Secondary | ICD-10-CM | POA: Diagnosis not present

## 2024-01-09 DIAGNOSIS — E1122 Type 2 diabetes mellitus with diabetic chronic kidney disease: Secondary | ICD-10-CM | POA: Diagnosis not present

## 2024-01-09 DIAGNOSIS — C9000 Multiple myeloma not having achieved remission: Secondary | ICD-10-CM | POA: Diagnosis not present

## 2024-01-09 DIAGNOSIS — J069 Acute upper respiratory infection, unspecified: Secondary | ICD-10-CM | POA: Diagnosis not present

## 2024-01-09 DIAGNOSIS — E1140 Type 2 diabetes mellitus with diabetic neuropathy, unspecified: Secondary | ICD-10-CM | POA: Diagnosis not present

## 2024-01-09 DIAGNOSIS — D649 Anemia, unspecified: Secondary | ICD-10-CM | POA: Diagnosis not present

## 2024-01-09 DIAGNOSIS — J302 Other seasonal allergic rhinitis: Secondary | ICD-10-CM | POA: Diagnosis not present

## 2024-01-13 DIAGNOSIS — M5431 Sciatica, right side: Secondary | ICD-10-CM | POA: Diagnosis not present

## 2024-01-13 DIAGNOSIS — M9903 Segmental and somatic dysfunction of lumbar region: Secondary | ICD-10-CM | POA: Diagnosis not present

## 2024-01-13 DIAGNOSIS — M5432 Sciatica, left side: Secondary | ICD-10-CM | POA: Diagnosis not present

## 2024-01-17 DIAGNOSIS — E782 Mixed hyperlipidemia: Secondary | ICD-10-CM | POA: Diagnosis not present

## 2024-01-17 DIAGNOSIS — D649 Anemia, unspecified: Secondary | ICD-10-CM | POA: Diagnosis not present

## 2024-01-17 DIAGNOSIS — E1140 Type 2 diabetes mellitus with diabetic neuropathy, unspecified: Secondary | ICD-10-CM | POA: Diagnosis not present

## 2024-01-17 DIAGNOSIS — M810 Age-related osteoporosis without current pathological fracture: Secondary | ICD-10-CM | POA: Diagnosis not present

## 2024-01-17 DIAGNOSIS — N1831 Chronic kidney disease, stage 3a: Secondary | ICD-10-CM | POA: Diagnosis not present

## 2024-01-17 DIAGNOSIS — I10 Essential (primary) hypertension: Secondary | ICD-10-CM | POA: Diagnosis not present

## 2024-01-17 DIAGNOSIS — E538 Deficiency of other specified B group vitamins: Secondary | ICD-10-CM | POA: Diagnosis not present

## 2024-01-17 DIAGNOSIS — C9000 Multiple myeloma not having achieved remission: Secondary | ICD-10-CM | POA: Diagnosis not present

## 2024-01-17 DIAGNOSIS — K219 Gastro-esophageal reflux disease without esophagitis: Secondary | ICD-10-CM | POA: Diagnosis not present

## 2024-01-17 DIAGNOSIS — E1122 Type 2 diabetes mellitus with diabetic chronic kidney disease: Secondary | ICD-10-CM | POA: Diagnosis not present

## 2024-01-17 DIAGNOSIS — E559 Vitamin D deficiency, unspecified: Secondary | ICD-10-CM | POA: Diagnosis not present

## 2024-01-21 ENCOUNTER — Other Ambulatory Visit: Payer: Self-pay

## 2024-01-21 DIAGNOSIS — C9001 Multiple myeloma in remission: Secondary | ICD-10-CM

## 2024-01-22 ENCOUNTER — Inpatient Hospital Stay: Admitting: Oncology

## 2024-01-22 ENCOUNTER — Encounter: Payer: Self-pay | Admitting: Oncology

## 2024-01-22 ENCOUNTER — Other Ambulatory Visit

## 2024-01-22 ENCOUNTER — Inpatient Hospital Stay: Attending: Hematology

## 2024-01-22 ENCOUNTER — Inpatient Hospital Stay

## 2024-01-22 VITALS — BP 123/71 | HR 76 | Temp 98.0°F | Resp 16 | Wt 190.4 lb

## 2024-01-22 DIAGNOSIS — Z5112 Encounter for antineoplastic immunotherapy: Secondary | ICD-10-CM | POA: Insufficient documentation

## 2024-01-22 DIAGNOSIS — C9001 Multiple myeloma in remission: Secondary | ICD-10-CM

## 2024-01-22 DIAGNOSIS — C9000 Multiple myeloma not having achieved remission: Secondary | ICD-10-CM

## 2024-01-22 LAB — COMPREHENSIVE METABOLIC PANEL WITH GFR
ALT: 8 U/L (ref 0–44)
AST: 17 U/L (ref 15–41)
Albumin: 4.4 g/dL (ref 3.5–5.0)
Alkaline Phosphatase: 110 U/L (ref 38–126)
Anion gap: 13 (ref 5–15)
BUN: 21 mg/dL (ref 8–23)
CO2: 29 mmol/L (ref 22–32)
Calcium: 10 mg/dL (ref 8.9–10.3)
Chloride: 98 mmol/L (ref 98–111)
Creatinine, Ser: 1.71 mg/dL — ABNORMAL HIGH (ref 0.61–1.24)
GFR, Estimated: 40 mL/min — ABNORMAL LOW (ref >60)
Glucose, Bld: 249 mg/dL — ABNORMAL HIGH (ref 70–99)
Potassium: 3.9 mmol/L (ref 3.5–5.1)
Sodium: 140 mmol/L (ref 135–145)
Total Bilirubin: 0.5 mg/dL (ref 0.0–1.2)
Total Protein: 7.1 g/dL (ref 6.5–8.1)

## 2024-01-22 LAB — CBC WITH DIFFERENTIAL/PLATELET
Abs Immature Granulocytes: 0.05 K/uL (ref 0.00–0.07)
Basophils Absolute: 0.1 K/uL (ref 0.0–0.1)
Basophils Relative: 1 %
Eosinophils Absolute: 1.3 K/uL — ABNORMAL HIGH (ref 0.0–0.5)
Eosinophils Relative: 11 %
HCT: 41.9 % (ref 39.0–52.0)
Hemoglobin: 14.8 g/dL (ref 13.0–17.0)
Immature Granulocytes: 0 %
Lymphocytes Relative: 20 %
Lymphs Abs: 2.4 K/uL (ref 0.7–4.0)
MCH: 33.1 pg (ref 26.0–34.0)
MCHC: 35.3 g/dL (ref 30.0–36.0)
MCV: 93.7 fL (ref 80.0–100.0)
Monocytes Absolute: 1.1 K/uL — ABNORMAL HIGH (ref 0.1–1.0)
Monocytes Relative: 9 %
Neutro Abs: 7.3 K/uL (ref 1.7–7.7)
Neutrophils Relative %: 59 %
Platelets: 276 K/uL (ref 150–400)
RBC: 4.47 MIL/uL (ref 4.22–5.81)
RDW: 12.2 % (ref 11.5–15.5)
WBC: 12.1 K/uL — ABNORMAL HIGH (ref 4.0–10.5)
nRBC: 0 % (ref 0.0–0.2)

## 2024-01-22 LAB — MAGNESIUM: Magnesium: 2.1 mg/dL (ref 1.7–2.4)

## 2024-01-22 MED ORDER — ZOLEDRONIC ACID 4 MG/5ML IV CONC
3.0000 mg | Freq: Once | INTRAVENOUS | Status: AC
  Administered 2024-01-22: 3 mg via INTRAVENOUS
  Filled 2024-01-22: qty 3.75

## 2024-01-22 MED ORDER — SODIUM CHLORIDE 0.9 % IV SOLN: Freq: Once | INTRAVENOUS | Status: AC

## 2024-01-22 MED ORDER — DARATUMUMAB-HYALURONIDASE-FIHJ 1800-30000 MG-UT/15ML ~~LOC~~ SOLN
1800.0000 mg | Freq: Once | SUBCUTANEOUS | Status: AC
  Administered 2024-01-22: 1800 mg via SUBCUTANEOUS
  Filled 2024-01-22: qty 15

## 2024-01-22 NOTE — Patient Instructions (Signed)
 [facility] [place] - A DEPT OF [place]. [facility]  Discharge Instructions: Thank you for choosing [facility] to provide your oncology and hematology care.  If you have a lab appointment with the Cancer Center - please note that after April 8th, 2024, all labs will be drawn in the cancer center.  You do not have to check in or register with the main entrance as you have in the past but will complete your check-in in the cancer center.  Wear comfortable clothing and clothing appropriate for easy access to any Portacath or PICC line.   We strive to give you quality time with your provider. You may need to reschedule your appointment if you arrive late (15 or more minutes).  Arriving late affects you and other patients whose appointments are after yours.  Also, if you miss three or more appointments without notifying the office, you may be dismissed from the clinic at the provider's discretion.      For prescription refill requests, have your pharmacy contact our office and allow 72 hours for refills to be completed.    Today you received the following chemotherapy and/or immunotherapy agents zometa , Darzalex  faspro injection   To help prevent nausea and vomiting after your treatment, we encourage you to take your nausea medication as directed.  BELOW ARE SYMPTOMS THAT SHOULD BE REPORTED IMMEDIATELY: *FEVER GREATER THAN 100.4 F (38 C) OR HIGHER *CHILLS OR SWEATING *NAUSEA AND VOMITING THAT IS NOT CONTROLLED WITH YOUR NAUSEA MEDICATION *UNUSUAL SHORTNESS OF BREATH *UNUSUAL BRUISING OR BLEEDING *URINARY PROBLEMS (pain or burning when urinating, or frequent urination) *BOWEL PROBLEMS (unusual diarrhea, constipation, pain near the anus) TENDERNESS IN MOUTH AND THROAT WITH OR WITHOUT PRESENCE OF ULCERS (sore throat, sores in mouth, or a toothache) UNUSUAL RASH, SWELLING OR PAIN  UNUSUAL VAGINAL DISCHARGE OR ITCHING   Items with * indicate a potential emergency and  should be followed up as soon as possible or go to the Emergency Department if any problems should occur.  Please show the CHEMOTHERAPY ALERT CARD or IMMUNOTHERAPY ALERT CARD at check-in to the Emergency Department and triage nurse.  Should you have questions after your visit or need to cancel or reschedule your appointment, please contact [facility] [place] - A DEPT OF [facility] [phone]  and follow the prompts.  Office hours are 8:00 a.m. to 4:30 p.m. Monday - Friday. Please note that voicemails left after 4:00 p.m. may not be returned until the following business day.  We are closed weekends and major holidays. You have access to a nurse at all times for urgent questions. Please call the main number to the clinic [phone] and follow the prompts.  For any non-urgent questions, you may also contact your provider using MyChart. We now offer e-Visits for anyone 5 and older to request care online for non-urgent symptoms. For details visit [email or web address].   Also download the MyChart app! Go to the app store, search MyChart, open the app, select [place], and log in with your MyChart username and password.

## 2024-01-22 NOTE — Progress Notes (Signed)
 SPIRITUAL CARE AND COUNSELING CONSULT NOTE   VISIT SUMMARY   Reason for Visit: Chaplain identified Pt on the schedule as a Pt I had not connected with yet and visited to deliver introduction to Spiritual Care  Description of Visit: Upon arrival I found Gerrad seated in the recliner and his treatment was finishing up. His wife, [name] was there as a support person.  I introduced myself as the chaplain for the cancer center and before I could offer a brief education on the role of a chaplain the Pt's treatment was over.   I spent a brief few minutes speaking with Pt and his wife and then wife asked me to pray.   I did promise to connect with Pt at his next visit.  Plan of Care: I will continue to follow up with [name] on a regular basis.   [name], MDiv  Chaplain, [facility] Reginae [email or web address] [phone]   SPIRITUAL ENCOUNTER                                                                                                                                                                      Type of Visit: Initial Care provided to:: Patient, Significant other (Wife [name]) Referral source: Chaplain assessment Reason for visit:  (Introduction to Spiritual Care) OnCall Visit: No   SPIRITUAL FRAMEWORK  Presenting Themes: Meaning/purpose/sources of inspiration, Other (comment) (Faith) Values/beliefs: Peyton and his wife are both dedicated Christians.  Wife asked me to pray during this visit. Community/Connection: Family, Faith community Strengths: 85 year marriage; Spirituality; Teamwork Patient Stress Factors: Not reviewed Family Stress Factors: Not reviewed   GOALS   Self/Personal Goals: Not reviewed yet   INTERVENTIONS   Spiritual Care Interventions Made: Established relationship of care and support, Prayer    INTERVENTION OUTCOMES   Outcomes: Connection to spiritual care  SPIRITUAL CARE PLAN   Spiritual Care Issues Still Outstanding:  Chaplain will continue to follow     [name], MDiv Chaplain, [facility] [email or web address] [phone]  01/22/2024 3:38 PM

## 2024-01-22 NOTE — Progress Notes (Signed)
 Labs reviewed today. Ok to treat per MD, pt took pre meds including steroids an hour ago.    Darzalex  faspro injection and zometa  infusion given per orders. Patient tolerated it well without problems. Vitals stable and discharged home from clinic ambulatory. Follow up as scheduled.

## 2024-01-23 ENCOUNTER — Other Ambulatory Visit: Payer: Self-pay

## 2024-01-23 LAB — KAPPA/LAMBDA LIGHT CHAINS
Kappa free light chain: 14.3 mg/L (ref 3.3–19.4)
Kappa, lambda light chain ratio: 1.51 (ref 0.26–1.65)
Lambda free light chains: 9.5 mg/L (ref 5.7–26.3)

## 2024-01-25 LAB — PROTEIN ELECTROPHORESIS, SERUM
A/G Ratio: 1.4 (ref 0.7–1.7)
Albumin ELP: 3.6 g/dL (ref 2.9–4.4)
Alpha-1-Globulin: 0.2 g/dL (ref 0.0–0.4)
Alpha-2-Globulin: 0.9 g/dL (ref 0.4–1.0)
Beta Globulin: 1 g/dL (ref 0.7–1.3)
Gamma Globulin: 0.5 g/dL (ref 0.4–1.8)
Globulin, Total: 2.6 g/dL (ref 2.2–3.9)
M-Spike, %: 0.1 g/dL — ABNORMAL HIGH
Total Protein ELP: 6.2 g/dL (ref 6.0–8.5)

## 2024-02-06 ENCOUNTER — Other Ambulatory Visit: Payer: Self-pay

## 2024-02-18 ENCOUNTER — Other Ambulatory Visit: Payer: Self-pay

## 2024-02-18 DIAGNOSIS — C9000 Multiple myeloma not having achieved remission: Secondary | ICD-10-CM

## 2024-02-18 NOTE — Progress Notes (Unsigned)
 SABRA

## 2024-02-19 ENCOUNTER — Inpatient Hospital Stay

## 2024-02-19 ENCOUNTER — Inpatient Hospital Stay: Attending: Hematology

## 2024-02-19 ENCOUNTER — Encounter: Payer: Self-pay | Admitting: Oncology

## 2024-02-19 VITALS — BP 125/45 | HR 75 | Temp 96.9°F | Resp 16 | Wt 196.0 lb

## 2024-02-19 DIAGNOSIS — Z5112 Encounter for antineoplastic immunotherapy: Secondary | ICD-10-CM | POA: Diagnosis present

## 2024-02-19 DIAGNOSIS — C9000 Multiple myeloma not having achieved remission: Secondary | ICD-10-CM

## 2024-02-19 DIAGNOSIS — C9001 Multiple myeloma in remission: Secondary | ICD-10-CM | POA: Diagnosis present

## 2024-02-19 LAB — CBC WITH DIFFERENTIAL/PLATELET
Basophils Absolute: 0 K/uL (ref 0.0–0.1)
Basophils Relative: 0 %
Eosinophils Absolute: 2.5 K/uL — ABNORMAL HIGH (ref 0.0–0.5)
Eosinophils Relative: 20 %
HCT: 40.2 % (ref 39.0–52.0)
Hemoglobin: 13.8 g/dL (ref 13.0–17.0)
Lymphocytes Relative: 21 %
Lymphs Abs: 2.6 K/uL (ref 0.7–4.0)
MCH: 32.2 pg (ref 26.0–34.0)
MCHC: 34.3 g/dL (ref 30.0–36.0)
MCV: 93.9 fL (ref 80.0–100.0)
Monocytes Absolute: 0.6 K/uL (ref 0.1–1.0)
Monocytes Relative: 5 %
Neutro Abs: 6.6 K/uL (ref 1.7–7.7)
Neutrophils Relative %: 54 %
Platelets: 258 K/uL (ref 150–400)
RBC: 4.28 MIL/uL (ref 4.22–5.81)
RDW: 12.9 % (ref 11.5–15.5)
Smear Review: NORMAL
WBC: 12.3 K/uL — ABNORMAL HIGH (ref 4.0–10.5)
nRBC: 0 % (ref 0.0–0.2)

## 2024-02-19 LAB — COMPREHENSIVE METABOLIC PANEL WITH GFR
ALT: 13 U/L (ref 0–44)
AST: 17 U/L (ref 15–41)
Albumin: 4.3 g/dL (ref 3.5–5.0)
Alkaline Phosphatase: 103 U/L (ref 38–126)
Anion gap: 13 (ref 5–15)
BUN: 24 mg/dL — ABNORMAL HIGH (ref 8–23)
CO2: 29 mmol/L (ref 22–32)
Calcium: 9.7 mg/dL (ref 8.9–10.3)
Chloride: 100 mmol/L (ref 98–111)
Creatinine, Ser: 1.81 mg/dL — ABNORMAL HIGH (ref 0.61–1.24)
GFR, Estimated: 38 mL/min — ABNORMAL LOW (ref >60)
Glucose, Bld: 210 mg/dL — ABNORMAL HIGH (ref 70–99)
Potassium: 4.3 mmol/L (ref 3.5–5.1)
Sodium: 142 mmol/L (ref 135–145)
Total Bilirubin: 0.6 mg/dL (ref 0.0–1.2)
Total Protein: 6.5 g/dL (ref 6.5–8.1)

## 2024-02-19 LAB — MAGNESIUM: Magnesium: 1.9 mg/dL (ref 1.7–2.4)

## 2024-02-19 MED ORDER — SODIUM CHLORIDE 0.9 % IV SOLN: Freq: Once | INTRAVENOUS | Status: AC

## 2024-02-19 MED ORDER — DARATUMUMAB-HYALURONIDASE-FIHJ 1800-30000 MG-UT/15ML ~~LOC~~ SOLN
1800.0000 mg | Freq: Once | SUBCUTANEOUS | Status: AC
  Administered 2024-02-19: 1800 mg via SUBCUTANEOUS
  Filled 2024-02-19: qty 15

## 2024-02-19 MED ORDER — ZOLEDRONIC ACID 4 MG/5ML IV CONC
3.0000 mg | Freq: Once | INTRAVENOUS | Status: AC
  Administered 2024-02-19: 3 mg via INTRAVENOUS
  Filled 2024-02-19: qty 3.75

## 2024-02-19 NOTE — Progress Notes (Signed)
 SPIRITUAL CARE AND COUNSELING CONSULT NOTE   VISIT SUMMARY   Reason for Visit: Chaplain making scheduled follow-up with [name] [name] I previously connected with.  Description of Visit: I entered the room and found [name] [name] sitting in the chair receiving his treatment, with his wife [name] [name] there as his support person.    [name] [name] and [name] [name] shared a lot today through life review.  Coury shared of his time in the [place] in the 1960's.  Most impactfully, though, they shared the story of their son's cancer journey and death in the 1980's.  He was in [employer] when diagnosed.  He experienced a brief remission, but ultimately died a year or 2 of diagnosis.  Through telling the story they were able to share much about their faith, and the ways in which they were grown and changed through that experience.   Prior to leaving, [name] asked me to pray for [name] [name] (his wife of 59 years).  He heart is growing weak and she is experiencing vision loss as well.  They are not anxious, but concerned.  Plan of Care: I will continue to follow [name] [name] on a Monthly basis.   SPIRITUAL ENCOUNTER                                                                                                                                                                      Type of Visit: Follow up Care provided to:: Patient, Significant other (Wife [name] [name]) Referral source: Chaplain assessment Reason for visit: Routine spiritual support   SPIRITUAL FRAMEWORK  Presenting Themes: Meaning/purpose/sources of inspiration, Goals in life/care, Values and beliefs, Impactful experiences and emotions, Courage hope and growth Values/beliefs: Professes and practices Christian Faith Community/Connection: Family Strengths: Hope; Faith; Love; Humor Needs/Challenges/Barriers: Wife is sole caregiver and she is struggling some right now with her own health. Patient Stress Factors: None identified Family Stress Factors: Health changes   GOALS    Self/Personal Goals: Manage symptoms, control pain Clinical Care Goals: Help identify and process emotions   INTERVENTIONS   Spiritual Care Interventions Made: Compassionate presence, Reflective listening, Narrative/life review, Meaning making, Bereavement/grief support, Prayer    INTERVENTION OUTCOMES   Outcomes: Awareness of support, Reduced isolation, Reduced anxiety  SPIRITUAL CARE PLAN   Spiritual Care Issues Still Outstanding: Chaplain will continue to follow    [name], MDiv Chaplain, [facility] [name] [phone] 02/19/2024 2:19 PM

## 2024-02-19 NOTE — Progress Notes (Signed)
 Darzalex  Fasrpo and Zometa  given today per MD orders. Tolerated infusion without adverse affects. Vital signs stable. No complaints at this time. Discharged from clinic ambulatory in stable condition. Alert and oriented x 3. F/U with [facility] as scheduled.

## 2024-02-19 NOTE — Patient Instructions (Signed)
 [facility] - A DEPT OF [place]  Discharge Instructions: Thank you for choosing [facility] to provide your oncology and hematology care.  If you have a lab appointment with the Cancer Center - please note that after April 8th, 2024, all labs will be drawn in the cancer center.  You do not have to check in or register with the main entrance as you have in the past but will complete your check-in in the cancer center.  Wear comfortable clothing and clothing appropriate for easy access to any Portacath or PICC line.   We strive to give you quality time with your provider. You may need to reschedule your appointment if you arrive late (15 or more minutes).  Arriving late affects you and other patients whose appointments are after yours.  Also, if you miss three or more appointments without notifying the office, you may be dismissed from the clinic at the provider's discretion.      For prescription refill requests, have your pharmacy contact our office and allow 72 hours for refills to be completed.    Today you received the following chemotherapy and/or immunotherapy agents Zometa  and Darzalex  Faspro.  Daratumumab ; Hyaluronidase  Injection What is this medication? DARATUMUMAB ; HYALURONIDASE  (dar a toom ue mab; hye al ur ON i dase) treats multiple myeloma, a type of bone marrow cancer. Daratumumab  works by blocking a protein that causes cancer cells to grow and multiply. This helps to slow or stop the spread of cancer cells. Hyaluronidase  works by increasing the absorption of other medications in the body to help them work better. This medication may also be used treat amyloidosis, a condition that causes the buildup of a protein (amyloid) in your body. It works by reducing the buildup of this protein, which decreases symptoms. It is a combination medication that contains a monoclonal antibody. This medicine may be used for other purposes; ask your health  care provider or pharmacist if you have questions. COMMON BRAND NAME(S): DARZALEX  FASPRO What should I tell my care team before I take this medication? They need to know if you have any of these conditions: Heart disease Infection, such as chickenpox, cold sores, herpes, hepatitis B Lung or breathing disease An unusual or allergic reaction to daratumumab , hyaluronidase , other medications, foods, dyes, or preservatives Pregnant or trying to get pregnant Breast-feeding How should I use this medication? This medication is injected under the skin. It is given by your care team in a hospital or clinic setting. Talk to your care team about the use of this medication in children. Special care may be needed. Overdosage: If you think you have taken too much of this medicine contact a poison control center or emergency room at once. NOTE: This medicine is only for you. Do not share this medicine with others. What if I miss a dose? Keep appointments for follow-up doses. It is important not to miss your dose. Call your care team if you are unable to keep an appointment. What may interact with this medication? Interactions have not been studied. This list may not describe all possible interactions. Give your health care provider a list of all the medicines, herbs, non-prescription drugs, or dietary supplements you use. Also tell them if you smoke, drink alcohol, or use illegal drugs. Some items may interact with your medicine. What should I watch for while using this medication? Your condition will be monitored carefully while you are receiving this medication. This medication can  cause serious allergic reactions. To reduce your risk, your care team may give you other medication to take before receiving this one. Be sure to follow the directions from your care team. This medication can affect the results of blood tests to match your blood type. These changes can last for up to 6 months after the final  dose. Your care team will do blood tests to match your blood type before you start treatment. Tell all of your care team that you are being treated with this medication before receiving a blood transfusion. This medication can affect the results of some tests used to determine treatment response; extra tests may be needed to evaluate response. Talk to your care team if you wish to become pregnant or think you are pregnant. This medication can cause serious birth defects if taken during pregnancy and for 3 months after the last dose. A reliable form of contraception is recommended while taking this medication and for 3 months after the last dose. Talk to your care team about effective forms of contraception. Do not breast-feed while taking this medication. What side effects may I notice from receiving this medication? Side effects that you should report to your care team as soon as possible: Allergic reactions--skin rash, itching, hives, swelling of the face, lips, tongue, or throat Heart rhythm changes--fast or irregular heartbeat, dizziness, feeling faint or lightheaded, chest pain, trouble breathing Infection--fever, chills, cough, sore throat, wounds that don't heal, pain or trouble when passing urine, general feeling of discomfort or being unwell Infusion reactions--chest pain, shortness of breath or trouble breathing, feeling faint or lightheaded Sudden eye pain or change in vision such as blurry vision, seeing halos around lights, vision loss Unusual bruising or bleeding Side effects that usually do not require medical attention (report to your care team if they continue or are bothersome): Constipation Diarrhea Fatigue Nausea Pain, tingling, or numbness in the hands or feet Swelling of the ankles, hands, or feet This list may not describe all possible side effects. Call your doctor for medical advice about side effects. You may report side effects to FDA at 1-800-FDA-1088. Where should I  keep my medication? This medication is given in a hospital or clinic. It will not be stored at home. NOTE: This sheet is a summary. It may not cover all possible information. If you have questions about this medicine, talk to your doctor, pharmacist, or health care provider.  2024 Elsevier/Gold Standard (2021-07-25 00:00:00)Zoledronic  Acid Injection (Cancer) What is this medication? ZOLEDRONIC  ACID (ZOE le dron ik AS id) treats high calcium levels in the blood caused by cancer. It may also be used with chemotherapy to treat weakened bones caused by cancer. It works by slowing down the release of calcium from bones. This lowers calcium levels in your blood. It also makes your bones stronger and less likely to break (fracture). It belongs to a group of medications called bisphosphonates. This medicine may be used for other purposes; ask your health care provider or pharmacist if you have questions. COMMON BRAND NAME(S): Zometa , Zometa  Powder What should I tell my care team before I take this medication? They need to know if you have any of these conditions: Dehydration Dental disease Kidney disease Liver disease Low levels of calcium in the blood Lung or breathing disease, such as asthma Receiving steroids, such as dexamethasone  or prednisone  An unusual or allergic reaction to zoledronic  acid, other medications, foods, dyes, or preservatives Pregnant or trying to get pregnant Breast-feeding How should I use  this medication? This medication is injected into a vein. It is given by your care team in a hospital or clinic setting. Talk to your care team about the use of this medication in children. Special care may be needed. Overdosage: If you think you have taken too much of this medicine contact a poison control center or emergency room at once. NOTE: This medicine is only for you. Do not share this medicine with others. What if I miss a dose? Keep appointments for follow-up doses. It is  important not to miss your dose. Call your care team if you are unable to keep an appointment. What may interact with this medication? Certain antibiotics given by injection Diuretics, such as bumetanide, furosemide NSAIDs, medications for pain and inflammation, such as ibuprofen or naproxen Teriparatide Thalidomide This list may not describe all possible interactions. Give your health care provider a list of all the medicines, herbs, non-prescription drugs, or dietary supplements you use. Also tell them if you smoke, drink alcohol, or use illegal drugs. Some items may interact with your medicine. What should I watch for while using this medication? Visit your care team for regular checks on your progress. It may be some time before you see the benefit from this medication. Some people who take this medication have severe bone, joint, or muscle pain. This medication may also increase your risk for jaw problems or a broken thigh bone. Tell your care team right away if you have severe pain in your jaw, bones, joints, or muscles. Tell you care team if you have any pain that does not go away or that gets worse. Tell your dentist and dental surgeon that you are taking this medication. You should not have major dental surgery while on this medication. See your dentist to have a dental exam and fix any dental problems before starting this medication. Take good care of your teeth while on this medication. Make sure you see your dentist for regular follow-up appointments. You should make sure you get enough calcium and vitamin D  while you are taking this medication. Discuss the foods you eat and the vitamins you take with your care team. Check with your care team if you have severe diarrhea, nausea, and vomiting, or if you sweat a lot. The loss of too much body fluid may make it dangerous for you to take this medication. You may need bloodwork while taking this medication. Talk to your care team if you wish to  become pregnant or think you might be pregnant. This medication can cause serious birth defects. What side effects may I notice from receiving this medication? Side effects that you should report to your care team as soon as possible: Allergic reactions--skin rash, itching, hives, swelling of the face, lips, tongue, or throat Kidney injury--decrease in the amount of urine, swelling of the ankles, hands, or feet Low calcium level--muscle pain or cramps, confusion, tingling, or numbness in the hands or feet Osteonecrosis of the jaw--pain, swelling, or redness in the mouth, numbness of the jaw, poor healing after dental work, unusual discharge from the mouth, visible bones in the mouth Severe bone, joint, or muscle pain Side effects that usually do not require medical attention (report to your care team if they continue or are bothersome): Constipation Fatigue Fever Loss of appetite Nausea Stomach pain This list may not describe all possible side effects. Call your doctor for medical advice about side effects. You may report side effects to FDA at 1-800-FDA-1088. Where should I keep  my medication? This medication is given in a hospital or clinic. It will not be stored at home. NOTE: This sheet is a summary. It may not cover all possible information. If you have questions about this medicine, talk to your doctor, pharmacist, or health care provider.  2024 Elsevier/Gold Standard (2021-05-12 00:00:00)      To help prevent nausea and vomiting after your treatment, we encourage you to take your nausea medication as directed.  BELOW ARE SYMPTOMS THAT SHOULD BE REPORTED IMMEDIATELY: *FEVER GREATER THAN 100.4 F (38 C) OR HIGHER *CHILLS OR SWEATING *NAUSEA AND VOMITING THAT IS NOT CONTROLLED WITH YOUR NAUSEA MEDICATION *UNUSUAL SHORTNESS OF BREATH *UNUSUAL BRUISING OR BLEEDING *URINARY PROBLEMS (pain or burning when urinating, or frequent urination) *BOWEL PROBLEMS (unusual diarrhea,  constipation, pain near the anus) TENDERNESS IN MOUTH AND THROAT WITH OR WITHOUT PRESENCE OF ULCERS (sore throat, sores in mouth, or a toothache) UNUSUAL RASH, SWELLING OR PAIN  UNUSUAL VAGINAL DISCHARGE OR ITCHING   Items with * indicate a potential emergency and should be followed up as soon as possible or go to the Emergency Department if any problems should occur.  Please show the CHEMOTHERAPY ALERT CARD or IMMUNOTHERAPY ALERT CARD at check-in to the Emergency Department and triage nurse.  Should you have questions after your visit or need to cancel or reschedule your appointment, please contact [facility] [place] - A DEPT OF [facility] [phone]  and follow the prompts.  Office hours are 8:00 a.m. to 4:30 p.m. Monday - Friday. Please note that voicemails left after 4:00 p.m. may not be returned until the following business day.  We are closed weekends and major holidays. You have access to a nurse at all times for urgent questions. Please call the main number to the clinic [phone] and follow the prompts.  For any non-urgent questions, you may also contact your provider using MyChart. We now offer e-Visits for anyone 65 and older to request care online for non-urgent symptoms. For details visit [email or web address].   Also download the MyChart app! Go to the app store, search MyChart, open the app, select [place], and log in with your MyChart username and password.

## 2024-02-19 NOTE — Progress Notes (Signed)
 Patient presents today for chemotherapy Darzalex  Faspro injection and Zometa  3 mg IV infusion.  Patient is in satisfactory condition with no new complaints voiced.  Vital signs are stable.  Labs reviewed and all labs are within treatment parameters.  We will proceed with treatment per MD orders.    Patient took pre-meds at home prior to arrival. Peripheral IV started with good blood return pre and post infusion.

## 2024-02-20 LAB — KAPPA/LAMBDA LIGHT CHAINS
Kappa free light chain: 15.7 mg/L (ref 3.3–19.4)
Kappa, lambda light chain ratio: 1.47 (ref 0.26–1.65)
Lambda free light chains: 10.7 mg/L (ref 5.7–26.3)

## 2024-02-23 LAB — PROTEIN ELECTROPHORESIS, SERUM
A/G Ratio: 1.4 (ref 0.7–1.7)
Albumin ELP: 3.6 g/dL (ref 2.9–4.4)
Alpha-1-Globulin: 0.2 g/dL (ref 0.0–0.4)
Alpha-2-Globulin: 0.9 g/dL (ref 0.4–1.0)
Beta Globulin: 1 g/dL (ref 0.7–1.3)
Gamma Globulin: 0.5 g/dL (ref 0.4–1.8)
Globulin, Total: 2.5 g/dL (ref 2.2–3.9)
Total Protein ELP: 6.1 g/dL (ref 6.0–8.5)

## 2024-03-03 ENCOUNTER — Other Ambulatory Visit (HOSPITAL_COMMUNITY): Payer: Self-pay | Admitting: Nephrology

## 2024-03-03 DIAGNOSIS — N1832 Chronic kidney disease, stage 3b: Secondary | ICD-10-CM

## 2024-03-03 DIAGNOSIS — R809 Proteinuria, unspecified: Secondary | ICD-10-CM

## 2024-03-06 ENCOUNTER — Ambulatory Visit (HOSPITAL_COMMUNITY): Admission: RE | Admit: 2024-03-06 | Discharge: 2024-03-06 | Attending: Nephrology | Admitting: Nephrology

## 2024-03-06 DIAGNOSIS — R809 Proteinuria, unspecified: Secondary | ICD-10-CM

## 2024-03-06 DIAGNOSIS — N1832 Chronic kidney disease, stage 3b: Secondary | ICD-10-CM

## 2024-03-16 ENCOUNTER — Other Ambulatory Visit: Payer: Self-pay | Admitting: Oncology

## 2024-03-16 DIAGNOSIS — C9000 Multiple myeloma not having achieved remission: Secondary | ICD-10-CM

## 2024-03-17 ENCOUNTER — Other Ambulatory Visit: Payer: Self-pay

## 2024-03-17 DIAGNOSIS — C9000 Multiple myeloma not having achieved remission: Secondary | ICD-10-CM

## 2024-03-18 ENCOUNTER — Inpatient Hospital Stay

## 2024-03-18 ENCOUNTER — Inpatient Hospital Stay: Attending: Hematology

## 2024-03-18 ENCOUNTER — Inpatient Hospital Stay: Admitting: Oncology

## 2024-03-18 ENCOUNTER — Other Ambulatory Visit: Payer: Self-pay

## 2024-03-18 VITALS — BP 138/78 | HR 76 | Temp 97.8°F | Resp 18 | Wt 191.3 lb

## 2024-03-18 VITALS — BP 154/73 | HR 83 | Temp 97.8°F | Resp 18

## 2024-03-18 DIAGNOSIS — E1142 Type 2 diabetes mellitus with diabetic polyneuropathy: Secondary | ICD-10-CM

## 2024-03-18 DIAGNOSIS — M810 Age-related osteoporosis without current pathological fracture: Secondary | ICD-10-CM | POA: Diagnosis not present

## 2024-03-18 DIAGNOSIS — C9001 Multiple myeloma in remission: Secondary | ICD-10-CM | POA: Diagnosis present

## 2024-03-18 DIAGNOSIS — C9000 Multiple myeloma not having achieved remission: Secondary | ICD-10-CM

## 2024-03-18 DIAGNOSIS — R7989 Other specified abnormal findings of blood chemistry: Secondary | ICD-10-CM | POA: Diagnosis not present

## 2024-03-18 DIAGNOSIS — Z5112 Encounter for antineoplastic immunotherapy: Secondary | ICD-10-CM | POA: Insufficient documentation

## 2024-03-18 DIAGNOSIS — Z79899 Other long term (current) drug therapy: Secondary | ICD-10-CM | POA: Diagnosis not present

## 2024-03-18 LAB — COMPREHENSIVE METABOLIC PANEL WITH GFR
ALT: 11 U/L (ref 0–44)
AST: 13 U/L — ABNORMAL LOW (ref 15–41)
Albumin: 4.4 g/dL (ref 3.5–5.0)
Alkaline Phosphatase: 105 U/L (ref 38–126)
Anion gap: 16 — ABNORMAL HIGH (ref 5–15)
BUN: 26 mg/dL — ABNORMAL HIGH (ref 8–23)
CO2: 26 mmol/L (ref 22–32)
Calcium: 10.5 mg/dL — ABNORMAL HIGH (ref 8.9–10.3)
Chloride: 98 mmol/L (ref 98–111)
Creatinine, Ser: 1.74 mg/dL — ABNORMAL HIGH (ref 0.61–1.24)
GFR, Estimated: 39 mL/min — ABNORMAL LOW (ref >60)
Glucose, Bld: 220 mg/dL — ABNORMAL HIGH (ref 70–99)
Potassium: 3.8 mmol/L (ref 3.5–5.1)
Sodium: 140 mmol/L (ref 135–145)
Total Bilirubin: 0.5 mg/dL (ref 0.0–1.2)
Total Protein: 6.7 g/dL (ref 6.5–8.1)

## 2024-03-18 LAB — CBC WITH DIFFERENTIAL/PLATELET
Abs Immature Granulocytes: 0.06 K/uL (ref 0.00–0.07)
Basophils Absolute: 0.1 K/uL (ref 0.0–0.1)
Basophils Relative: 1 %
Eosinophils Absolute: 1.1 K/uL — ABNORMAL HIGH (ref 0.0–0.5)
Eosinophils Relative: 10 %
HCT: 41.7 % (ref 39.0–52.0)
Hemoglobin: 14.4 g/dL (ref 13.0–17.0)
Immature Granulocytes: 1 %
Lymphocytes Relative: 18 %
Lymphs Abs: 1.8 K/uL (ref 0.7–4.0)
MCH: 32.4 pg (ref 26.0–34.0)
MCHC: 34.5 g/dL (ref 30.0–36.0)
MCV: 93.7 fL (ref 80.0–100.0)
Monocytes Absolute: 0.9 K/uL (ref 0.1–1.0)
Monocytes Relative: 8 %
Neutro Abs: 6.6 K/uL (ref 1.7–7.7)
Neutrophils Relative %: 62 %
Platelets: 274 K/uL (ref 150–400)
RBC: 4.45 MIL/uL (ref 4.22–5.81)
RDW: 13.4 % (ref 11.5–15.5)
WBC: 10.5 K/uL (ref 4.0–10.5)
nRBC: 0 % (ref 0.0–0.2)

## 2024-03-18 LAB — MAGNESIUM: Magnesium: 2 mg/dL (ref 1.7–2.4)

## 2024-03-18 MED ORDER — DARATUMUMAB-HYALURONIDASE-FIHJ 1800-30000 MG-UT/15ML ~~LOC~~ SOLN
1800.0000 mg | Freq: Once | SUBCUTANEOUS | Status: AC
  Administered 2024-03-18: 13:00:00 1800 mg via SUBCUTANEOUS
  Filled 2024-03-18: qty 15

## 2024-03-18 MED ORDER — ZOLEDRONIC ACID 4 MG/5ML IV CONC
3.0000 mg | Freq: Once | INTRAVENOUS | Status: AC
  Administered 2024-03-18: 12:00:00 3 mg via INTRAVENOUS
  Filled 2024-03-18: qty 3.75

## 2024-03-18 MED ORDER — SODIUM CHLORIDE 0.9 % IV SOLN: Freq: Once | INTRAVENOUS | Status: AC

## 2024-03-18 NOTE — Progress Notes (Signed)
 Patient presents today for chemotherapy Darzalex  injection . Patient is in satisfactory condition with no new complaints voiced.  Vital signs are stable.  Labs reviewed by Dr. Davonna during the office visit and all labs are within treatment parameters. Patient to get Zometa  3mg  per Dr Armanda order. Patient took premeds of Tylenol  and Zrytec prior to arrival. Patient's blood glucose 211, patient refusing insulin .  We will proceed with treatment per MD orders.   Patient tolerated treatment well with no complaints voiced.  Patient left ambulatory in stable condition.  Vital signs stable at discharge.  Follow up as scheduled.

## 2024-03-18 NOTE — Progress Notes (Signed)
 Patient Care Team: Shona Norleen PEDLAR, MD as PCP - General (Internal Medicine) Alvan Dorn FALCON, MD as Consulting Physician (Cardiology)  Clinic Day:  03/18/2024  Referring physician: Shona Norleen PEDLAR, MD   CHIEF COMPLAINT:  CC: Multiple myeloma    ASSESSMENT & PLAN:   Assessment & Plan: [name] [name]  is a 78 y.o. male with multiple myeloma   Assessment and Plan  Multiple myeloma in remission  Patient has intermediate risk multiple myeloma diagnosed in 01/2022 s/p Dara VRD.  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 Developed severe rash with Revlimid  and is currently off of it since 11/23/2022 Continuing single agent daratumumab  every month along with dexamethasone  Reviewed Labs today from 02/19/2024: No M spike, normal serum free light chains with a normal ratio   - Continue daratumumab  monthly.  Continue acyclovir  prophylaxis. - Physical exam stable today.  Okay to proceed with daratumumab  infusion.   Return to clinic in 12 weeks with labs.  Age related osteoporosis, unspecified pathological fracture presence DEXA 09/11/2022 with a T-score of -3.5 Monthly Zometa  started on 01/30/2023 History of multiple fractures including recent metatarsal fracture diagnosed on 11/08/2022. Current calcium: 10   - Continue calcium supplements.  Continue monthly Zometa .  Peripheral neuropathy Chronic neuropathic pain and numbness in feet, not well-managed on current medication regimen.  - Continue current gabapentin three times daily.  CKD Recent worsening of kidney function  - Follows with Dr. Rachele   The patient understands the plans discussed today and is in agreement with them.  He knows to contact our office if he develops concerns prior to his next appointment.  21 minutes of total time was spent for this patient encounter, including preparation,face-to-face counseling with the patient and coordination of care, physical exam, and documentation of the  encounter.    LILLETTE Verneta Closs Teague,acting as a neurosurgeon for Mickiel Dry, MD.,have documented all relevant documentation on the behalf of Mickiel Dry, MD,as directed by  Mickiel Dry, MD while in the presence of Mickiel Dry, MD.  I, Mickiel Dry MD, have reviewed the above documentation for accuracy and completeness, and I agree with the above.    Mickiel Dry, MD  [facility] [place] - A DEPT OF [facility] [address] [place] [address] Dept: [phone] Dept Fax: [phone]   No orders of the defined types were placed in this encounter.    ONCOLOGY HISTORY:   Diagnosis: Stage II IgG kappa multiple myeloma, standard risk   -05/13/2020: SPEP: M-spike 3.1, total protein 9.1. Immunoglobulins: IgG 3,714, IgA 124, IgM 65. KFLC 240.8 with FLC ratio of 17.71.  -05/18/2020: Skeletal Survey: No acute bony abnormality or focal lytic lesion.  -06/06/2020: Bone Marrow Biopsy.  Pathology: Plasma cell myeloma. Plasma cells are increased in numbers (14% by manual differential counts) with atypical forms (large forms, prominent nucleoli). Lymphocytes are not increased. CD138 immunohistochemistry highlights increased plasma cells (20% overall).  By light chain in situ hybridization the plasma cells are kappa restricted.   -Chromosome Analysis: Normal Male Karyotype -Myeloma FISH Panel: Del 13q -06/10/2020: IFE shows IgG monoclonal protein with kappa light chain specificity.  -05/2020 - 09/2021: Myeloma labs relatively stable -09/13/2020: SPEP: M-spike 3.3. Immunoglobulins: IgG 3,750, IgA 104, IgM 57. Beta-2  microglobulin: 2.8. LDH normal.  -02/07/2021: NM Body Scan: No focal abnormality is seen in the radionuclide bone scan.  -06/13/2021: Skeletal Survey: No acute findings.  -06/29/2021: Initial PET: No PET-CT findings for metabolically active  bone lesions to suggest active myeloma. -10/04/2021: SPEP: M-spike 4.4,  Total protein 10.1. KFLC 570.5 with FLC ratio of 54.33.  -10/20/2021: MRI of cervical,thoracic, and lumbar spine: No osseous lesions  -02/01/2022: Bone Marrow Biopsy. Pathology: Hypercellular bone marrow with plasma cell neoplasm. Plasma cells represent 35% of all cells in the aspirate associated with interstitial infiltrates and numerous variably sized clusters in the clot and biopsy sections. The plasma cells display kappa light chain restriction consistent with plasma cell neoplasm.   -Chromosome Analysis: Normal Male Karyotype  -Myeloma FISH Panel: Del 13q -04/05/2022: PET: No abnormal foci of increased uptake within the skeleton to suggest metabolically active multiple myeloma. No suspicious bone lesions identified on the CT portion of the exam. No tracer avid lymph node or mass identified to suggest soft tissue plasmacytoma -04/05/2022: SPEP: M-spike 5.5. KFLC 656.3 with FLC ratio of 71.34. Beta-2  Microglobulin 4.1.  -05/01/2022 - 11/23/2022: DaraRD, started with Revlimid  15 mg, dose reduced to 10 mg 3 weeks on/1 week off on 07/05/2022 due to near syncope, Revlimid  discontinued due to severe rash -05/2022 - current: Myeloma labs improved to stable with M-spike mostly not observed since 03/2024 -11/24/2022 - current: Monthly Daratumumab  and dexamethasone   Current Treatment:  Monthly Daratumumab  and dexamethasone   INTERVAL HISTORY:   Discussed the use of AI scribe software for clinical note transcription with the patient, who gave verbal consent to proceed.  History of Present Illness KENYATA GUESS is a 78 year old male with multiple myeloma and chronic kidney disease who presents for routine hematology/oncology follow-up.  He reports persistent bilateral foot pain and numbness localized to the plantar surfaces, consistent with peripheral neuropathy. Symptoms remain stable but bothersome, and he manages neuropathy with gabapentin three times daily, having discontinued higher doses and  other ineffective agents.  He continues monthly Zometa  infusions and receives steroids during clinic visits. He remains on acyclovir  for shingles prophylaxis and takes calcium and magnesium  supplements at home. Laboratory results from November demonstrated no abnormal protein, no anemia, and all abnormal proteins within normal limits. Additional laboratory testing was performed today, with results pending.  He has chronic kidney disease with a recent creatinine of 1.74, which is slightly improved. He is scheduled for further blood work and nephrology follow-up. He denies cough, cold symptoms, or pulmonary infections.   I have reviewed the past medical history, past surgical history, social history and family history with the patient and they are unchanged from previous note.  ALLERGIES:  is allergic to penicillins and gadavist  [gadobutrol ].  MEDICATIONS:  Current Outpatient Medications  Medication Sig Dispense Refill   ACCU-CHEK AVIVA PLUS test strip USE 1 STRIP TO CHECK GLUCOSE TWICE DAILY     Accu-Chek FastClix Lancets MISC Apply topically 2 (two) times daily.     acyclovir  (ZOVIRAX ) 400 MG tablet Take 1 tablet (400 mg total) by mouth 2 (two) times daily. 180 tablet 3   aspirin  EC 81 MG tablet Take 1 tablet (81 mg total) by mouth daily. Swallow whole. (Patient taking differently: Take 81 mg by mouth daily. Swallow whole. Takes 3 times a week) 30 tablet 12   atenolol-chlorthalidone (TENORETIC) 50-25 MG tablet Take 1 tablet by mouth daily.     Azelastine HCl 137 MCG/SPRAY SOLN Place 2 sprays into both nostrils 2 (two) times daily.     cyanocobalamin  (VITAMIN B12) 1000 MCG/ML injection Inject 1,000 mcg into the muscle every 30 (thirty) days.     Daratumumab -Hyaluronidase -fihj (DARZALEX  FASPRO Tobias) Inject into the skin.     dexamethasone  (DECADRON )  4 MG tablet Take 5 tablets (20 mg total) by mouth once a week. 30 tablet 6   diclofenac sodium (VOLTAREN) 1 % GEL Apply 2 g topically daily as needed.      gabapentin (NEURONTIN) 300 MG capsule Take 300 mg by mouth 3 (three) times daily.     levocetirizine (XYZAL) 5 MG tablet Take 1 tablet every day by oral route at bedtime.     loperamide (IMODIUM A-D) 2 MG tablet Take 2 mg by mouth 4 (four) times daily as needed for diarrhea or loose stools.     losartan (COZAAR) 100 MG tablet Take 1 tablet every day by oral route.     MAGNESIUM -OXIDE 400 (240 Mg) MG tablet Take 1 tablet by mouth twice daily 120 tablet 0   metFORMIN (GLUCOPHAGE) 500 MG tablet Take 500 mg by mouth 2 (two) times daily with a meal.     pantoprazole (PROTONIX) 40 MG tablet Take 40 mg by mouth daily.     pramipexole (MIRAPEX) 0.125 MG tablet Take 0.125 mg by mouth daily.     prochlorperazine  (COMPAZINE ) 10 MG tablet Take 1 tablet (10 mg total) by mouth every 6 (six) hours as needed for nausea or vomiting. 30 tablet 3   silver sulfADIAZINE (SILVADENE) 1 % cream Apply topically.     hydrOXYzine  (ATARAX ) 25 MG tablet Take 1 tablet (25 mg total) by mouth every 6 (six) hours as needed. (Patient not taking: Reported on 03/18/2024) 60 tablet 0   No current facility-administered medications for this visit.     VITALS:  Blood pressure 138/78, pulse 76, temperature 97.8 F (36.6 C), temperature source Tympanic, resp. rate 18, weight 191 lb 4.8 oz (86.8 kg), SpO2 98%.  Wt Readings from Last 3 Encounters:  03/18/24 191 lb 4.8 oz (86.8 kg)  02/19/24 196 lb (88.9 kg)  01/22/24 190 lb 6.4 oz (86.4 kg)    Body mass index is 29.09 kg/m.  Performance status (ECOG): 2 - Symptomatic, <50% confined to bed  PHYSICAL EXAM:   GENERAL:alert, no distress and comfortable SKIN: skin color, texture, turgor are normal, no rashes or significant lesions LYMPH:  no palpable lymphadenopathy in the cervical, axillary or inguinal LUNGS: clear to auscultation and percussion with normal breathing effort HEART: regular rate & rhythm and no murmurs and no lower extremity edema ABDOMEN:abdomen soft,  non-tender and normal bowel sounds Musculoskeletal:no cyanosis of digits and no clubbing  NEURO: alert & oriented x 3 with fluent speech  LABORATORY DATA:  I have reviewed the data as listed     Component Value Date/Time   NA 140 03/18/2024 0909   NA 137 08/13/2018 0000   K 3.8 03/18/2024 0909   CL 98 03/18/2024 0909   CO2 26 03/18/2024 0909   GLUCOSE 220 (H) 03/18/2024 0909   BUN 26 (H) 03/18/2024 0909   BUN 22 (A) 08/13/2018 0000   CREATININE 1.74 (H) 03/18/2024 0909   CREATININE 1.58 (H) 12/25/2023 1046   CALCIUM 10.5 (H) 03/18/2024 0909   PROT 6.7 03/18/2024 0909   ALBUMIN 4.4 03/18/2024 0909   AST 13 (L) 03/18/2024 0909   AST 16 12/25/2023 1046   ALT 11 03/18/2024 0909   ALT 14 12/25/2023 1046   ALKPHOS 105 03/18/2024 0909   BILITOT 0.5 03/18/2024 0909   BILITOT 1.0 12/25/2023 1046   GFRNONAA 39 (L) 03/18/2024 0909   GFRNONAA 44 (L) 12/25/2023 1046   GFRAA 74 08/13/2018 0000     Lab Results  Component Value Date  WBC 10.5 03/18/2024   NEUTROABS 6.6 03/18/2024   HGB 14.4 03/18/2024   HCT 41.7 03/18/2024   MCV 93.7 03/18/2024   PLT 274 03/18/2024    Latest Reference Range & Units 02/19/24 11:21  Kappa free light chain 3.3 - 19.4 mg/L 15.7  Lambda free light chains 5.7 - 26.3 mg/L 10.7  Kappa, lambda light chain ratio 0.26 - 1.65  1.47    Latest Reference Range & Units 02/19/24 11:21  Total Protein ELP 6.0 - 8.5 g/dL 6.1  Albumin ELP 2.9 - 4.4 g/dL 3.6  Globulin, Total 2.2 - 3.9 g/dL 2.5 (C)  A/G Ratio 0.7 - 1.7  1.4 (C)  Alpha-1-Globulin 0.0 - 0.4 g/dL 0.2  Joeyj-7-Honalopw 0.4 - 1.0 g/dL 0.9  Beta Globulin 0.7 - 1.3 g/dL 1.0  Gamma Globulin 0.4 - 1.8 g/dL 0.5  M-SPIKE, % Not Observed g/dL Not Observed  SPE Interp.  Comment  Comment  Comment  (C): Corrected  RADIOGRAPHIC STUDIES: I have personally reviewed the radiological images as listed and agreed with the findings in the report.

## 2024-03-18 NOTE — Patient Instructions (Signed)
 [facility]  - A DEPT OF [facility]  Discharge Instructions: Thank you for choosing [facility] to provide your oncology and hematology care.  If you have a lab appointment with the Cancer Center - please note that after April 8th, 2024, all labs will be drawn in the cancer center.  You do not have to check in or register with the main entrance as you have in the past but will complete your check-in in the cancer center.  Wear comfortable clothing and clothing appropriate for easy access to any Portacath or PICC line.   We strive to give you quality time with your provider. You may need to reschedule your appointment if you arrive late (15 or more minutes).  Arriving late affects you and other patients whose appointments are after yours.  Also, if you miss three or more appointments without notifying the office, you may be dismissed from the clinic at the providers discretion.      For prescription refill requests, have your pharmacy contact our office and allow 72 hours for refills to be completed.    Today you received the following chemotherapy and/or immunotherapy agents Zometa  and Darzalex .  Zoledronic  Acid Injection (Cancer) What is this medication? ZOLEDRONIC  ACID (ZOE le dron ik AS id) treats high calcium levels in the blood caused by cancer. It may also be used with chemotherapy to treat weakened bones caused by cancer. It works by slowing down the release of calcium from bones. This lowers calcium levels in your blood. It also makes your bones stronger and less likely to break (fracture). It belongs to a group of medications called bisphosphonates. This medicine may be used for other purposes; ask your health care provider or pharmacist if you have questions. COMMON BRAND NAME(S): Zometa , Zometa  Powder What should I tell my care team before I take this medication? They need to know if you have any of these conditions: Dehydration Dental  disease Kidney disease Liver disease Low levels of calcium in the blood Lung or breathing disease, such as asthma Receiving steroids, such as dexamethasone  or prednisone  An unusual or allergic reaction to zoledronic  acid, other medications, foods, dyes, or preservatives Pregnant or trying to get pregnant Breast-feeding How should I use this medication? This medication is injected into a vein. It is given by your care team in a hospital or clinic setting. Talk to your care team about the use of this medication in children. Special care may be needed. Overdosage: If you think you have taken too much of this medicine contact a poison control center or emergency room at once. NOTE: This medicine is only for you. Do not share this medicine with others. What if I miss a dose? Keep appointments for follow-up doses. It is important not to miss your dose. Call your care team if you are unable to keep an appointment. What may interact with this medication? Certain antibiotics given by injection Diuretics, such as bumetanide, furosemide NSAIDs, medications for pain and inflammation, such as ibuprofen or naproxen Teriparatide Thalidomide This list may not describe all possible interactions. Give your health care provider a list of all the medicines, herbs, non-prescription drugs, or dietary supplements you use. Also tell them if you smoke, drink alcohol, or use illegal drugs. Some items may interact with your medicine. What should I watch for while using this medication? Visit your care team for regular checks on your progress. It may be some time before you see the  benefit from this medication. Some people who take this medication have severe bone, joint, or muscle pain. This medication may also increase your risk for jaw problems or a broken thigh bone. Tell your care team right away if you have severe pain in your jaw, bones, joints, or muscles. Tell you care team if you have any pain that does not  go away or that gets worse. Tell your dentist and dental surgeon that you are taking this medication. You should not have major dental surgery while on this medication. See your dentist to have a dental exam and fix any dental problems before starting this medication. Take good care of your teeth while on this medication. Make sure you see your dentist for regular follow-up appointments. You should make sure you get enough calcium and vitamin D  while you are taking this medication. Discuss the foods you eat and the vitamins you take with your care team. Check with your care team if you have severe diarrhea, nausea, and vomiting, or if you sweat a lot. The loss of too much body fluid may make it dangerous for you to take this medication. You may need bloodwork while taking this medication. Talk to your care team if you wish to become pregnant or think you might be pregnant. This medication can cause serious birth defects. What side effects may I notice from receiving this medication? Side effects that you should report to your care team as soon as possible: Allergic reactions--skin rash, itching, hives, swelling of the face, lips, tongue, or throat Kidney injury--decrease in the amount of urine, swelling of the ankles, hands, or feet Low calcium level--muscle pain or cramps, confusion, tingling, or numbness in the hands or feet Osteonecrosis of the jaw--pain, swelling, or redness in the mouth, numbness of the jaw, poor healing after dental work, unusual discharge from the mouth, visible bones in the mouth Severe bone, joint, or muscle pain Side effects that usually do not require medical attention (report to your care team if they continue or are bothersome): Constipation Fatigue Fever Loss of appetite Nausea Stomach pain This list may not describe all possible side effects. Call your doctor for medical advice about side effects. You may report side effects to FDA at 1-800-FDA-1088. Where should  I keep my medication? This medication is given in a hospital or clinic. It will not be stored at home. NOTE: This sheet is a summary. It may not cover all possible information. If you have questions about this medicine, talk to your doctor, pharmacist, or health care provider.  2024 Elsevier/Gold Standard (2021-05-12 00:00:00)  Daratumumab  Injection What is this medication? DARATUMUMAB  (dar a toom ue mab) treats multiple myeloma, a type of bone marrow cancer. It works by helping your immune system slow or stop the spread of cancer cells. It is a monoclonal antibody. This medicine may be used for other purposes; ask your health care provider or pharmacist if you have questions. COMMON BRAND NAME(S): DARZALEX  What should I tell my care team before I take this medication? They need to know if you have any of these conditions: Hereditary fructose intolerance Infection, such as chickenpox, herpes, hepatitis B Lung or breathing disease, such as asthma, COPD An unusual or allergic reaction to daratumumab , sorbitol, other medications, foods, dyes, or preservatives Pregnant or trying to get pregnant Breastfeeding How should I use this medication? This medication is injected into a vein. It is given by your care team in a hospital or clinic setting. Talk to your care  team about the use of this medication in children. Special care may be needed. Overdosage: If you think you have taken too much of this medicine contact a poison control center or emergency room at once. NOTE: This medicine is only for you. Do not share this medicine with others. What if I miss a dose? Keep appointments for follow-up doses. It is important not to miss your dose. Call your care team if you are unable to keep an appointment. What may interact with this medication? Interactions have not been studied. This list may not describe all possible interactions. Give your health care provider a list of all the medicines, herbs,  non-prescription drugs, or dietary supplements you use. Also tell them if you smoke, drink alcohol, or use illegal drugs. Some items may interact with your medicine. What should I watch for while using this medication? Your condition will be monitored carefully while you are receiving this medication. This medication can cause serious allergic reactions. To reduce your risk, your care team may give you other medication to take before receiving this one. Be sure to follow the directions from your care team. This medication can affect the results of blood tests to match your blood type. These changes can last for up to 6 months after the final dose. Your care team will do blood tests to match your blood type before you start treatment. Tell all of your care team that you are being treated with this medication before receiving a blood transfusion. This medication can affect the results of some tests used to determine treatment response; extra tests may be needed to evaluate response. Talk to your care team if you wish to become pregnant or think you are pregnant. This medication can cause serious birth defects if taken during pregnancy and for 3 months after the last dose. A reliable form of contraception is recommended while taking this medication and for 3 months after the last dose. Talk to your care team about effective forms of contraception. Do not breast-feed while taking this medication. What side effects may I notice from receiving this medication? Side effects that you should report to your care team as soon as possible: Allergic reactions--skin rash, itching, hives, swelling of the face, lips, tongue, or throat Infection--fever, chills, cough, sore throat, wounds that don't heal, pain or trouble when passing urine, general feeling of discomfort or being unwell Infusion reactions--chest pain, shortness of breath or trouble breathing, feeling faint or lightheaded Unusual bruising or bleeding Side  effects that usually do not require medical attention (report to your care team if they continue or are bothersome): Constipation Diarrhea Fatigue Nausea Pain, tingling, or numbness in the hands or feet Swelling of the ankles, hands, or feet This list may not describe all possible side effects. Call your doctor for medical advice about side effects. You may report side effects to FDA at 1-800-FDA-1088. Where should I keep my medication? This medication is given in a hospital or clinic. It will not be stored at home. NOTE: This sheet is a summary. It may not cover all possible information. If you have questions about this medicine, talk to your doctor, pharmacist, or health care provider.  2024 Elsevier/Gold Standard (2022-01-25 00:00:00)   To help prevent nausea and vomiting after your treatment, we encourage you to take your nausea medication as directed.  BELOW ARE SYMPTOMS THAT SHOULD BE REPORTED IMMEDIATELY: *FEVER GREATER THAN 100.4 F (38 C) OR HIGHER *CHILLS OR SWEATING *NAUSEA AND VOMITING THAT IS NOT CONTROLLED  WITH YOUR NAUSEA MEDICATION *UNUSUAL SHORTNESS OF BREATH *UNUSUAL BRUISING OR BLEEDING *URINARY PROBLEMS (pain or burning when urinating, or frequent urination) *BOWEL PROBLEMS (unusual diarrhea, constipation, pain near the anus) TENDERNESS IN MOUTH AND THROAT WITH OR WITHOUT PRESENCE OF ULCERS (sore throat, sores in mouth, or a toothache) UNUSUAL RASH, SWELLING OR PAIN  UNUSUAL VAGINAL DISCHARGE OR ITCHING   Items with * indicate a potential emergency and should be followed up as soon as possible or go to the Emergency Department if any problems should occur.  Please show the CHEMOTHERAPY ALERT CARD or IMMUNOTHERAPY ALERT CARD at check-in to the Emergency Department and triage nurse.  Should you have questions after your visit or need to cancel or reschedule your appointment, please contact [facility] - A DEPT OF [facility]  [phone]  and follow the prompts.  Office hours are 8:00 a.m. to 4:30 p.m. Monday - Friday. Please note that voicemails left after 4:00 p.m. may not be returned until the following business day.  We are closed weekends and major holidays. You have access to a nurse at all times for urgent questions. Please call the main number to the clinic [phone] and follow the prompts.  For any non-urgent questions, you may also contact your provider using MyChart. We now offer e-Visits for anyone 29 and older to request care online for non-urgent symptoms. For details visit [email or web address].   Also download the MyChart app! Go to the app store, search MyChart, open the app, select [facility], and log in with your MyChart username and password.

## 2024-03-18 NOTE — Patient Instructions (Signed)

## 2024-03-19 LAB — KAPPA/LAMBDA LIGHT CHAINS
Kappa free light chain: 14.5 mg/L (ref 3.3–19.4)
Kappa, lambda light chain ratio: 1.41 (ref 0.26–1.65)
Lambda free light chains: 10.3 mg/L (ref 5.7–26.3)

## 2024-03-20 ENCOUNTER — Other Ambulatory Visit: Payer: Self-pay

## 2024-03-20 LAB — PROTEIN ELECTROPHORESIS, SERUM
A/G Ratio: 1.5 (ref 0.7–1.7)
Albumin ELP: 3.8 g/dL (ref 2.9–4.4)
Alpha-1-Globulin: 0.2 g/dL (ref 0.0–0.4)
Alpha-2-Globulin: 0.9 g/dL (ref 0.4–1.0)
Beta Globulin: 0.9 g/dL (ref 0.7–1.3)
Gamma Globulin: 0.4 g/dL (ref 0.4–1.8)
Globulin, Total: 2.5 g/dL (ref 2.2–3.9)
M-Spike, %: 0.1 g/dL — ABNORMAL HIGH
Total Protein ELP: 6.3 g/dL (ref 6.0–8.5)

## 2024-03-22 ENCOUNTER — Other Ambulatory Visit: Payer: Self-pay

## 2024-04-07 LAB — LAB REPORT - SCANNED
A1c: 7.6
Albumin, Urine POC: 17.9
Creatinine, POC: 109 mg/dL
EGFR: 38
Microalb Creat Ratio: 16

## 2024-04-13 ENCOUNTER — Other Ambulatory Visit: Payer: Self-pay | Admitting: *Deleted

## 2024-04-13 MED ORDER — ACYCLOVIR 400 MG PO TABS: 400.0000 mg | ORAL_TABLET | Freq: Two times a day (BID) | ORAL | 3 refills | Status: AC

## 2024-04-15 ENCOUNTER — Inpatient Hospital Stay

## 2024-04-15 ENCOUNTER — Inpatient Hospital Stay: Attending: Hematology

## 2024-04-15 VITALS — BP 125/56 | HR 67 | Temp 96.3°F | Resp 20 | Wt 187.8 lb

## 2024-04-15 DIAGNOSIS — Z5112 Encounter for antineoplastic immunotherapy: Secondary | ICD-10-CM | POA: Insufficient documentation

## 2024-04-15 DIAGNOSIS — C9001 Multiple myeloma in remission: Secondary | ICD-10-CM | POA: Insufficient documentation

## 2024-04-15 DIAGNOSIS — C9000 Multiple myeloma not having achieved remission: Secondary | ICD-10-CM

## 2024-04-15 LAB — CBC WITH DIFFERENTIAL/PLATELET
Abs Immature Granulocytes: 0.04 K/uL (ref 0.00–0.07)
Basophils Absolute: 0 K/uL (ref 0.0–0.1)
Basophils Relative: 0 %
Eosinophils Absolute: 0.8 K/uL — ABNORMAL HIGH (ref 0.0–0.5)
Eosinophils Relative: 8 %
HCT: 39 % (ref 39.0–52.0)
Hemoglobin: 13.2 g/dL (ref 13.0–17.0)
Immature Granulocytes: 0 %
Lymphocytes Relative: 20 %
Lymphs Abs: 2 K/uL (ref 0.7–4.0)
MCH: 31.9 pg (ref 26.0–34.0)
MCHC: 33.8 g/dL (ref 30.0–36.0)
MCV: 94.2 fL (ref 80.0–100.0)
Monocytes Absolute: 0.9 K/uL (ref 0.1–1.0)
Monocytes Relative: 9 %
Neutro Abs: 6.4 K/uL (ref 1.7–7.7)
Neutrophils Relative %: 63 %
Platelets: 272 K/uL (ref 150–400)
RBC: 4.14 MIL/uL — ABNORMAL LOW (ref 4.22–5.81)
RDW: 13.4 % (ref 11.5–15.5)
WBC: 10.2 K/uL (ref 4.0–10.5)
nRBC: 0 % (ref 0.0–0.2)

## 2024-04-15 LAB — COMPREHENSIVE METABOLIC PANEL WITH GFR
ALT: 9 U/L (ref 0–44)
AST: 12 U/L — ABNORMAL LOW (ref 15–41)
Albumin: 4.2 g/dL (ref 3.5–5.0)
Alkaline Phosphatase: 94 U/L (ref 38–126)
Anion gap: 12 (ref 5–15)
BUN: 25 mg/dL — ABNORMAL HIGH (ref 8–23)
CO2: 30 mmol/L (ref 22–32)
Calcium: 10.4 mg/dL — ABNORMAL HIGH (ref 8.9–10.3)
Chloride: 100 mmol/L (ref 98–111)
Creatinine, Ser: 1.54 mg/dL — ABNORMAL HIGH (ref 0.61–1.24)
GFR, Estimated: 46 mL/min — ABNORMAL LOW
Glucose, Bld: 172 mg/dL — ABNORMAL HIGH (ref 70–99)
Potassium: 3.8 mmol/L (ref 3.5–5.1)
Sodium: 142 mmol/L (ref 135–145)
Total Bilirubin: 0.4 mg/dL (ref 0.0–1.2)
Total Protein: 6.2 g/dL — ABNORMAL LOW (ref 6.5–8.1)

## 2024-04-15 LAB — MAGNESIUM: Magnesium: 1.9 mg/dL (ref 1.7–2.4)

## 2024-04-15 MED ORDER — DARATUMUMAB-HYALURONIDASE-FIHJ 1800-30000 MG-UT/15ML ~~LOC~~ SOLN
1800.0000 mg | Freq: Once | SUBCUTANEOUS | Status: AC
  Administered 2024-04-15: 1800 mg via SUBCUTANEOUS
  Filled 2024-04-15: qty 15

## 2024-04-15 MED ORDER — ZOLEDRONIC ACID 4 MG/5ML IV CONC
3.0000 mg | Freq: Once | INTRAVENOUS | Status: AC
  Administered 2024-04-15: 3 mg via INTRAVENOUS
  Filled 2024-04-15: qty 3.75

## 2024-04-15 MED ORDER — SODIUM CHLORIDE 0.9 % IV SOLN: Freq: Once | INTRAVENOUS | Status: AC

## 2024-04-15 NOTE — Patient Instructions (Signed)
 [facility] - A DEPT OF [place]  Discharge Instructions: Thank you for choosing [facility] to provide your oncology and hematology care.  If you have a lab appointment with the Cancer Center - please note that after April 8th, 2024, all labs will be drawn in the cancer center.  You do not have to check in or register with the main entrance as you have in the past but will complete your check-in in the cancer center.  Wear comfortable clothing and clothing appropriate for easy access to any Portacath or PICC line.   We strive to give you quality time with your provider. You may need to reschedule your appointment if you arrive late (15 or more minutes).  Arriving late affects you and other patients whose appointments are after yours.  Also, if you miss three or more appointments without notifying the office, you may be dismissed from the clinic at the providers discretion.      For prescription refill requests, have your pharmacy contact our office and allow 72 hours for refills to be completed.    Today you received the following chemotherapy and/or immunotherapy agents Daratumumab /Zomeat      To help prevent nausea and vomiting after your treatment, we encourage you to take your nausea medication as directed.  BELOW ARE SYMPTOMS THAT SHOULD BE REPORTED IMMEDIATELY: *FEVER GREATER THAN 100.4 F (38 C) OR HIGHER *CHILLS OR SWEATING *NAUSEA AND VOMITING THAT IS NOT CONTROLLED WITH YOUR NAUSEA MEDICATION *UNUSUAL SHORTNESS OF BREATH *UNUSUAL BRUISING OR BLEEDING *URINARY PROBLEMS (pain or burning when urinating, or frequent urination) *BOWEL PROBLEMS (unusual diarrhea, constipation, pain near the anus) TENDERNESS IN MOUTH AND THROAT WITH OR WITHOUT PRESENCE OF ULCERS (sore throat, sores in mouth, or a toothache) UNUSUAL RASH, SWELLING OR PAIN  UNUSUAL VAGINAL DISCHARGE OR ITCHING   Items with * indicate a potential emergency and should be  followed up as soon as possible or go to the Emergency Department if any problems should occur.  Please show the CHEMOTHERAPY ALERT CARD or IMMUNOTHERAPY ALERT CARD at check-in to the Emergency Department and triage nurse.  Should you have questions after your visit or need to cancel or reschedule your appointment, please contact [facility] [place] - A DEPT OF [facility] [phone]  and follow the prompts.  Office hours are 8:00 a.m. to 4:30 p.m. Monday - Friday. Please note that voicemails left after 4:00 p.m. may not be returned until the following business day.  We are closed weekends and major holidays. You have access to a nurse at all times for urgent questions. Please call the main number to the clinic [phone] and follow the prompts.  For any non-urgent questions, you may also contact your provider using MyChart. We now offer e-Visits for anyone 75 and older to request care online for non-urgent symptoms. For details visit [email or web address].   Also download the MyChart app! Go to the app store, search MyChart, open the app, select [facility], and log in with your MyChart username and password.

## 2024-04-15 NOTE — Progress Notes (Signed)
 Patient presents today for Zometa  infusion and Daratumumab  injection per providers order.  Vital signs and labs within parameters for treatment.  Patient has no new complaints at this time.  Treatment given today per MD orders.  Tolerated infusion without adverse affects.  Vital signs stable.  Stable during administration without incident; injection site WNL; see MAR for injection details. No complaints at this time.  Discharge from clinic ambulatory in stable condition.  Alert and oriented X 3.  Follow up with [facility] as scheduled.

## 2024-04-16 LAB — KAPPA/LAMBDA LIGHT CHAINS
Kappa free light chain: 13.4 mg/L (ref 3.3–19.4)
Kappa, lambda light chain ratio: 1.63 (ref 0.26–1.65)
Lambda free light chains: 8.2 mg/L (ref 5.7–26.3)

## 2024-04-17 LAB — PROTEIN ELECTROPHORESIS, SERUM
A/G Ratio: 1.6 (ref 0.7–1.7)
Albumin ELP: 3.6 g/dL (ref 2.9–4.4)
Alpha-1-Globulin: 0.2 g/dL (ref 0.0–0.4)
Alpha-2-Globulin: 0.8 g/dL (ref 0.4–1.0)
Beta Globulin: 0.8 g/dL (ref 0.7–1.3)
Gamma Globulin: 0.4 g/dL (ref 0.4–1.8)
Globulin, Total: 2.3 g/dL (ref 2.2–3.9)
Total Protein ELP: 5.9 g/dL — ABNORMAL LOW (ref 6.0–8.5)

## 2024-05-13 ENCOUNTER — Inpatient Hospital Stay

## 2024-05-13 ENCOUNTER — Inpatient Hospital Stay: Attending: Hematology

## 2024-06-10 ENCOUNTER — Inpatient Hospital Stay: Attending: Hematology | Admitting: Oncology

## 2024-06-10 ENCOUNTER — Inpatient Hospital Stay
